# Patient Record
Sex: Female | Born: 1985 | Race: Black or African American | Hispanic: No | Marital: Single | Smoking: Never smoker
Health system: Southern US, Community
[De-identification: ages and names within clinical notes are randomized; demographics above are authoritative.]

## PROBLEM LIST (undated history)

## (undated) ENCOUNTER — Inpatient Hospital Stay (HOSPITAL_COMMUNITY): Payer: Self-pay

## (undated) ENCOUNTER — Inpatient Hospital Stay (HOSPITAL_COMMUNITY): Payer: Medicaid Other

## (undated) DIAGNOSIS — Z789 Other specified health status: Secondary | ICD-10-CM

## (undated) DIAGNOSIS — R12 Heartburn: Secondary | ICD-10-CM

## (undated) DIAGNOSIS — O26899 Other specified pregnancy related conditions, unspecified trimester: Secondary | ICD-10-CM

## (undated) HISTORY — PX: TUBAL LIGATION: SHX77

---
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
## 2003-09-14 ENCOUNTER — Emergency Department (HOSPITAL_COMMUNITY): Admission: EM | Admit: 2003-09-14 | Discharge: 2003-09-14 | Payer: Self-pay | Admitting: Emergency Medicine

## 2003-12-03 ENCOUNTER — Inpatient Hospital Stay (HOSPITAL_COMMUNITY): Admission: AD | Admit: 2003-12-03 | Discharge: 2003-12-04 | Payer: Self-pay | Admitting: *Deleted

## 2004-01-14 ENCOUNTER — Emergency Department (HOSPITAL_COMMUNITY): Admission: EM | Admit: 2004-01-14 | Discharge: 2004-01-15 | Payer: Self-pay | Admitting: Emergency Medicine

## 2004-08-09 ENCOUNTER — Inpatient Hospital Stay (HOSPITAL_COMMUNITY): Admission: AD | Admit: 2004-08-09 | Discharge: 2004-08-09 | Payer: Self-pay | Admitting: Obstetrics & Gynecology

## 2004-09-07 ENCOUNTER — Encounter (INDEPENDENT_AMBULATORY_CARE_PROVIDER_SITE_OTHER): Payer: Self-pay | Admitting: *Deleted

## 2004-09-07 ENCOUNTER — Inpatient Hospital Stay (HOSPITAL_COMMUNITY): Admission: AD | Admit: 2004-09-07 | Discharge: 2004-09-11 | Payer: Self-pay | Admitting: Obstetrics & Gynecology

## 2007-02-11 ENCOUNTER — Emergency Department (HOSPITAL_COMMUNITY): Admission: EM | Admit: 2007-02-11 | Discharge: 2007-02-11 | Payer: Self-pay | Admitting: Emergency Medicine

## 2010-08-02 ENCOUNTER — Encounter: Payer: Self-pay | Admitting: Obstetrics

## 2010-11-27 NOTE — Op Note (Signed)
Miranda Bowen, Miranda Bowen               ACCOUNT NO.:  [id number]   MEDICAL RECORD NO.:  [id number]          PATIENT TYPE:  INP   LOCATION:  9145                          FACILITY:  [facility]   PHYSICIAN:  Charles A. Clearance Coots, M.D.DATE OF BIRTH:  [birth year]   DATE OF PROCEDURE:  09/08/2004  DATE OF DISCHARGE:                                 OPERATIVE REPORT   PREOP DIAGNOSIS:  1.  [redacted] weeks gestation.  2.  Breech presentation.  3.  Unengaged.  4.  Active rupture of membranes.   DISCHARGE DIAGNOSIS:  1.  [redacted] weeks gestation.  2.  Breech presentation.  3.  Unengaged.  4.  Active rupture of membranes.  5.  Status post primary low transverse cesarean section on 09/08/2004 with      delivery of viable female infant at 12:34, Apgars of 8 at one minute and 9      at five minutes, weight of 7 pounds 13 ounces. Mother and infant      discharged home in good condition.   REASON FOR ADMISSION:  25 year old black female presented at [redacted] weeks  gestation with uterine contractions, a favorable cervix. Decision was made  to proceed with augmentation of labor. The patient was started on Pitocin  IV. Upon reaching 3 cm dilatation, amniotic membranes were artificially  ruptured and on further examination the presenting part was found to be  frank breech. Decision was made to proceed with cesarean section delivery  and the patient agreed.   PAST MEDICAL HISTORY:  Surgery; none. Illnesses; none.   MEDICATIONS:  Prenatal vitamins.   ALLERGIES:  NO KNOWN DRUG ALLERGIES.   SOCIAL HISTORY:  Single. Negative tobacco, alcohol, or recreational drug  use.   PHYSICAL EXAM:  GENERAL:  Well-nourished, well-developed black female in no  acute distress.  VITAL SIGNS:  Afebrile, vital signs stable.  HEENT exam was within normal limits.  LUNGS:  Clear to auscultation bilaterally.  HEART: regular rate and rhythm.  ABDOMEN: Gravid, soft, nontender. Cervix 3 cm dilated, 70% effaced and  breech, at -3 station. The breech  was -3 station and unengaged.   ADMITTING LABORATORY VALUES:  Please refer to a chart.   HOSPITAL COURSE:  The patient was taken to the operating room and primary  Pfannenstiel skin incision was made with scalpel down to the fascia. Fascia  was nicked in the midline and fascial incision was extended to the left and  to the right with curved Mayo scissors. Superior and inferior fascial edges  were taken off of the rectus muscle with blunt and sharp dissection. Rectus  rectus muscle was divided sharply in the midline being careful to avoid the  urinary bladder. Peritoneum was entered digitally and was digitally extended  to the left and to right. The bladder blade was positioned and the breech  was palpated in lower uterine segment as a frank breech. The primary low  transverse cesarean section was then performed in the lower uterine segment.  Membranes were ruptured and the breech was delivered as a frank breech in  routine fashion without complications and with the assistance of  Dr. Jean RosenthalChristell Constant. Infant's mouth and nose were suctioned with suction bulb and cord was  doubly clamped and the infant was handed off to nursery staff. Cord blood  was obtained and submitted to pathology for evaluation and the placenta was  also manually removed from the uterine cavity intact and submitted to  pathology for evaluation. The endometrial surface was thoroughly debrided  with a dry lap sponge and the edges of the uterine incision were grasped  with ring forceps. Uterus was closed with a double layer of first layer  being a continuous interlocking suture of 0 Monocryl. Second layer being a  continuous imbricating suture of 0 Monocryl. Hemostasis was excellent.  Pelvic cavity was thoroughly irrigated with warm saline solution and all  areas of clots and bleeding were coagulated with the Bovie until good  hemostasis was obtained. The pelvic cavity was then closed as follows.  Peritoneum was closed  with a continuous suture of 2-0 Monocryl. Fascia was  closed with a continuous suture of 0 PDS from each corner to the center.  Subcutaneous tissue was thoroughly irrigated with warm saline solution. All  areas of subcutaneous bleeding were coagulated with Bovie. Skin was  approximated with surgical stainless steel staples. Sterile bandage was  applied to the incision closure. Surgical technician indicated that all  needle, sponge and instrument counts were correct x2. The patient tolerated  the procedure well and transported to recovery room in satisfactory  condition.      CAH/MEDQ  D:  09/10/2004  T:  09/10/2004  Job:  [id number]

## 2010-11-27 NOTE — Discharge Summary (Signed)
NAMEKENSEY, LUEPKE               ACCOUNT NO.:  [id number]   MEDICAL RECORD NO.:  [id number]          PATIENT TYPE:  INP   LOCATION:  9145                          FACILITY:  [facility]   PHYSICIAN:  Charles A. Clearance Coots, M.D.DATE OF BIRTH:  1986-02-14   DATE OF ADMISSION:  09/07/2004  DATE OF DISCHARGE:  09/11/2004                                 DISCHARGE SUMMARY   ADMISSION DIAGNOSES:  1.  At 40+ weeks gestation.  2.  Uterine contractions.   DISCHARGE DIAGNOSES:  1.  At 40+ weeks gestation.  2.  Uterine contractions.  3.  Status post primary low transverse cesarean section for breech      presentation.  Delivered a viable female infant on September 08, 2004, at      12:34.  Apgars of 8 at one minute and 9 at five minutes.  Weight 3560 g.      Length 52 cm.  Mother and infant discharged home in good condition.   REASON FOR ADMISSION:  A 25 year old female G1 at 40-2/[redacted] weeks gestation,  presented to the office with uterine contractions.  Prenatal care was  uncomplicated starting at eight weeks gestation.   PAST MEDICAL HISTORY:  Surgery:  None.  Illnesses:  None.   MEDICATIONS:  Prenatal vitamins.   ALLERGIES:  No known drug allergies.   PRENATAL LABS:  Blood type A positive.  Antibody screen was positive for  anti-M.  Rubella immune.  Hepatitis B surface antigen negative.  Group B  Strep negative.  Syphilis nonreactive.  HIV nonreactive.   PHYSICAL EXAMINATION:  GENERAL APPEARANCE:  A well-nourished, well-developed  female in no acute distress.  VITAL SIGNS:  Afebrile.  Vital signs are stable.  ABDOMEN:  Gravid, nontender.  CERVIX:  1 cm dilated, 50% effaced and felt to be vertex at a -3 station.   Patient was admitted for cervical ripening with Cervidil.   ADMISSION LABORATORY DATA:  Hemoglobin 11.3, hematocrit 34.5, white blood  cell count 11,300, platelets 348,000.   HOSPITAL COURSE:  Patient was admitted and ripened overnight with Cervidil.  On reevaluation in the a.m.,  cervix was thought to be 2 cm dilated, 60%  effaced and was felt to be vertex at -4.  Pitocin was then started and on  reexamination, the position was felt not to be vertex and informal  ultrasound was done which confirmed the presentation to be breech.  A  decision was made to proceed with cesarean section delivery.  Primary  cesarean section was performed on September 08, 2004, without complications.  It was a primary low transverse cesarean section.  There were in no  intraoperative complications.   Postoperative course was uncomplicated.  Patient was discharged home on  postoperative day #3 in good condition.   DISCHARGE LABORATORY DATA:  Hemoglobin 9.7, hematocrit 29.1, white blood  cell count 11,500, platelets 310,000.   DISPOSITION:  1.  Medications:  Tylox and ibuprofen prescribed for pain.  2.  Routine written instructions per booklet were given for obstetrical      discharge after a cesarean section.  3.  The patient is to  call the office for a follow-up appointment in two      weeks.      CAH/MEDQ  D:  09/29/2004  T:  09/29/2004  Job:  [id number]

## 2011-06-21 ENCOUNTER — Inpatient Hospital Stay (HOSPITAL_COMMUNITY)
Admission: AD | Admit: 2011-06-21 | Discharge: 2011-06-21 | Disposition: A | Discharge status: Home / Self Care | Payer: Commercial Managed Care - PPO | Source: Ambulatory Visit | Attending: Obstetrics & Gynecology | Admitting: Obstetrics & Gynecology

## 2011-06-21 ENCOUNTER — Encounter (HOSPITAL_COMMUNITY): Payer: Self-pay | Admitting: *Deleted

## 2011-06-21 DIAGNOSIS — B373 Candidiasis of vulva and vagina: Secondary | ICD-10-CM

## 2011-06-21 DIAGNOSIS — B3731 Acute candidiasis of vulva and vagina: Secondary | ICD-10-CM | POA: Insufficient documentation

## 2011-06-21 DIAGNOSIS — N949 Unspecified condition associated with female genital organs and menstrual cycle: Secondary | ICD-10-CM | POA: Insufficient documentation

## 2011-06-21 HISTORY — DX: Other specified health status: Z78.9

## 2011-06-21 LAB — URINALYSIS, ROUTINE W REFLEX MICROSCOPIC
Glucose, UA: NEGATIVE mg/dL
Ketones, ur: NEGATIVE mg/dL
Nitrite: NEGATIVE
Urobilinogen, UA: 2 mg/dL — ABNORMAL HIGH (ref 0.0–1.0)

## 2011-06-21 LAB — WET PREP, GENITAL
Clue Cells Wet Prep HPF POC: NONE SEEN
Trich, Wet Prep: NONE SEEN
Yeast Wet Prep HPF POC: NONE SEEN

## 2011-06-21 LAB — POCT PREGNANCY, URINE: Preg Test, Ur: NEGATIVE

## 2011-06-21 MED ORDER — FLUCONAZOLE 150 MG PO TABS: 150.0000 mg | ORAL_TABLET | Freq: Once | ORAL | Status: AC

## 2011-06-21 NOTE — Progress Notes (Signed)
Patient states she has been having pelvic pain off and on. Has not had a period since 10-27 and has had a negative home pregnancy test. Has been having headaches.

## 2011-06-22 LAB — GC/CHLAMYDIA PROBE AMP, GENITAL: Chlamydia, DNA Probe: NEGATIVE

## 2011-07-07 ENCOUNTER — Encounter (HOSPITAL_COMMUNITY): Payer: Self-pay | Admitting: *Deleted

## 2011-07-07 ENCOUNTER — Emergency Department (HOSPITAL_COMMUNITY): Payer: No Typology Code available for payment source

## 2011-07-07 ENCOUNTER — Emergency Department (HOSPITAL_COMMUNITY)
Admission: EM | Admit: 2011-07-07 | Discharge: 2011-07-07 | Disposition: A | Discharge status: Home / Self Care | Payer: No Typology Code available for payment source | Attending: Emergency Medicine | Admitting: Emergency Medicine

## 2011-07-07 DIAGNOSIS — R109 Unspecified abdominal pain: Secondary | ICD-10-CM | POA: Insufficient documentation

## 2011-07-07 DIAGNOSIS — R110 Nausea: Secondary | ICD-10-CM | POA: Insufficient documentation

## 2011-07-07 LAB — URINALYSIS, ROUTINE W REFLEX MICROSCOPIC
Ketones, ur: NEGATIVE mg/dL
Protein, ur: NEGATIVE mg/dL
Urobilinogen, UA: 1 mg/dL (ref 0.0–1.0)

## 2011-07-07 LAB — URINE MICROSCOPIC-ADD ON

## 2011-07-07 MED ORDER — OXYCODONE-ACETAMINOPHEN 5-325 MG PO TABS
1.0000 | ORAL_TABLET | ORAL | Status: AC | PRN
Start: 2011-07-07 — End: 2011-07-17

## 2011-07-07 MED ORDER — IOHEXOL 300 MG/ML  SOLN
85.0000 mL | Freq: Once | INTRAMUSCULAR | Status: AC | PRN
  Administered 2011-07-07: 85 mL via INTRAVENOUS

## 2011-07-07 MED ORDER — IBUPROFEN 200 MG PO TABS
600.0000 mg | ORAL_TABLET | Freq: Once | ORAL | Status: AC
  Administered 2011-07-07: 600 mg via ORAL
  Filled 2011-07-07: qty 3

## 2011-07-07 MED ORDER — IBUPROFEN 800 MG PO TABS: 800.0000 mg | ORAL_TABLET | Freq: Three times a day (TID) | ORAL | Status: AC

## 2011-07-07 MED ORDER — ONDANSETRON 4 MG PO TBDP
8.0000 mg | ORAL_TABLET | Freq: Once | ORAL | Status: AC
  Administered 2011-07-07: 8 mg via ORAL
  Filled 2011-07-07: qty 2
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
## 2011-07-16 NOTE — ED Provider Notes (Signed)
Evaluation and management procedures were performed by the PA/NP/CNM under my supervision/collaboration.   Chrystine Oiler, MD 07/16/11 [phone]

## 2012-08-27 ENCOUNTER — Encounter (HOSPITAL_COMMUNITY): Payer: Self-pay | Admitting: Nurse Practitioner

## 2012-08-27 ENCOUNTER — Emergency Department (HOSPITAL_COMMUNITY)
Admission: EM | Admit: 2012-08-27 | Discharge: 2012-08-27 | Disposition: A | Discharge status: Home / Self Care | Payer: Self-pay | Attending: Emergency Medicine | Admitting: Emergency Medicine

## 2012-08-27 DIAGNOSIS — L0291 Cutaneous abscess, unspecified: Secondary | ICD-10-CM

## 2012-08-27 DIAGNOSIS — IMO0002 Reserved for concepts with insufficient information to code with codable children: Secondary | ICD-10-CM | POA: Insufficient documentation

## 2012-08-27 MED ORDER — HYDROCODONE-ACETAMINOPHEN 5-325 MG PO TABS: ORAL_TABLET | ORAL | Status: DC

## 2012-08-27 NOTE — ED Provider Notes (Signed)
History    This chart was scribed for non-physician practitioner working with Dione Booze, MD by Frederik Pear, ED Scribe. This patient was seen in room TR11C/TR11C and the patient's care was started at 1459.     CSN: [id number]  Arrival date & time 08/27/12  1344   First MD Initiated Contact with Patient 08/27/12 1459      Chief Complaint  Patient presents with  . Wound Infection    (Consider location/radiation/quality/duration/timing/severity/associated sxs/prior treatment) The history is provided by the patient. No language interpreter was used.    Miranda Bowen is a 27 y.o. female who presents to the Emergency Department complaining of a gradual onset, gradually worsening, constant, non-radiating boil at the right axilla that began 1 week ago. She has a h/o of similar boils, but states that all the previous instances have resolved on their own. She denies any fever, emesis, nausea, fatigue. She reports that she has been treating the area with an OTC boil cream with no relief. She denies any chronic medical conditions that require daily medication.   Past Medical History  Diagnosis Date  . No pertinent past medical history     Past Surgical History  Procedure Laterality Date  . Cesarean section      History reviewed. No pertinent family history.  History  Substance Use Topics  . Smoking status: Never Smoker   . Smokeless tobacco: Not on file  . Alcohol Use: No    OB History   Grav Para Term Preterm Abortions TAB SAB Ect Mult Living   1 1 1       1       Review of Systems  Constitutional: Negative for fever and fatigue.  Gastrointestinal: Negative for nausea and vomiting.  Skin: Positive for wound. Negative for color change.       Positive for abscess.  Hematological: Negative for adenopathy.  All other systems reviewed and are negative.    Allergies  Review of patient's allergies indicates no known allergies.  Home Medications   Current Outpatient Rx   Name  Route  Sig  Dispense  Refill  . OVER THE COUNTER MEDICATION   Topical   Apply 1 application topically 2 (two) times daily as needed (boils). Boil cream           BP 121/74  Pulse 88  Temp(Src) 98.4 F (36.9 C) (Oral)  Resp 16  SpO2 98%  Physical Exam  Nursing note and vitals reviewed. Constitutional: She appears well-developed and well-nourished. No distress.  HENT:  Head: Normocephalic and atraumatic.  Eyes: EOM are normal. Pupils are equal, round, and reactive to light.  Neck: Normal range of motion. Neck supple. No tracheal deviation present.  Cardiovascular: Normal rate.   Pulmonary/Chest: Effort normal. No respiratory distress.  Abdominal: Soft. She exhibits no distension.  Musculoskeletal: Normal range of motion. She exhibits no edema.  Neurological: She is alert.  Skin: Skin is warm and dry.  There is a 4x2 cm flucuant area at the right axilla that is palpable with surrounding erythema, but no evidence of surrounding cellulitis.  Psychiatric: She has a normal mood and affect. Her behavior is normal. Thought content normal.    ED Course  Procedures (including critical care time)  DIAGNOSTIC STUDIES: Oxygen Saturation is 98% on room air, normal by my interpretation.    COORDINATION OF CARE:  15:05- Discussed planned course of treatment with the patient, including draining the area and applying heat to the area over the next few  days at home, who is agreeable at this time.  15:35- INCISION AND DRAINAGE Performed by: Rhea Bleacher Consent: Verbal consent obtained. Risks and benefits: risks, benefits and alternatives were discussed Type: abscess  Body area: right axilla  Anesthesia: local infiltration  Incision was made with a scalpel.  Local anesthetic: lidocaine 2% with epinephrine  Anesthetic total: 2 ml  Complexity: complex Blunt dissection to break up loculations  Drainage: purulent  Drainage amount: moderate  Packing material:  N/A  Patient tolerance: Patient tolerated the procedure well with no immediate complications.   Labs Reviewed - No data to display No results found.   1. Abscess     Vital signs reviewed and are as follows: Filed Vitals:   08/27/12 1559  BP: 108/69  Pulse: 69  Temp:   Resp: 17   The patient was urged to return to the Emergency Department urgently with worsening pain, swelling, expanding erythema especially if it streaks away from the affected area, fever, or if they have any other concerns.   The patient was urged to return to the Emergency Department or go to their PCP in 48 hours for wound recheck if the area is not significantly improved.  The patient verbalized understanding and stated agreement with this plan.   Patient counseled on use of narcotic pain medications. Counseled not to combine these medications with others containing tylenol. Urged not to drink alcohol, drive, or perform any other activities that requires focus while taking these medications. The patient verbalizes understanding and agrees with the plan.   MDM  Patient with skin abscess amenable to incision and drainage.  No signs of cellulitis is surrounding skin.  Will d/c to home.  No antibiotic therapy is indicated. No systemic symptoms of illness.    I personally performed the services described in this documentation, which was scribed in my presence. The recorded information has been reviewed and is accurate.        Renne Crigler, [place] 08/27/12 [phone]

## 2012-08-27 NOTE — ED Notes (Signed)
Boil under R armpit last week, applyign OTC boil cream with no relief

## 2012-08-27 NOTE — ED Provider Notes (Signed)
Medical screening examination/treatment/procedure(s) were performed by non-physician practitioner and as supervising physician I was immediately available for consultation/collaboration.   Dione Booze, MD 08/27/12 [phone]
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
## 2012-10-18 ENCOUNTER — Inpatient Hospital Stay (HOSPITAL_COMMUNITY)
Admission: AD | Admit: 2012-10-18 | Discharge: 2012-10-19 | Disposition: A | Discharge status: Home / Self Care | Payer: Self-pay | Source: Ambulatory Visit | Attending: Obstetrics & Gynecology | Admitting: Obstetrics & Gynecology

## 2012-10-18 ENCOUNTER — Encounter (HOSPITAL_COMMUNITY): Payer: Self-pay

## 2012-10-18 DIAGNOSIS — A599 Trichomoniasis, unspecified: Secondary | ICD-10-CM

## 2012-10-18 DIAGNOSIS — R630 Anorexia: Secondary | ICD-10-CM | POA: Insufficient documentation

## 2012-10-18 DIAGNOSIS — R109 Unspecified abdominal pain: Secondary | ICD-10-CM | POA: Insufficient documentation

## 2012-10-18 DIAGNOSIS — A5901 Trichomonal vulvovaginitis: Secondary | ICD-10-CM | POA: Insufficient documentation

## 2012-10-18 DIAGNOSIS — R112 Nausea with vomiting, unspecified: Secondary | ICD-10-CM | POA: Insufficient documentation

## 2012-10-18 LAB — COMPREHENSIVE METABOLIC PANEL
ALT: 9 U/L (ref 0–35)
AST: 18 U/L (ref 0–37)
Albumin: 3.6 g/dL (ref 3.5–5.2)
CO2: 26 mEq/L (ref 19–32)
Chloride: 102 mEq/L (ref 96–112)
GFR calc non Af Amer: >90 mL/min (ref >90)
Total Bilirubin: 0.6 mg/dL (ref 0.3–1.2)

## 2012-10-18 LAB — CBC
HCT: 37.2 % (ref 36.0–46.0)
Hemoglobin: 12.4 g/dL (ref 12.0–15.0)
MCH: 29 pg (ref 26.0–34.0)
MCHC: 33.3 g/dL (ref 30.0–36.0)
MCV: 86.9 fL (ref 78.0–100.0)
RDW: 12.4 % (ref 11.5–15.5)

## 2012-10-18 LAB — URINALYSIS, ROUTINE W REFLEX MICROSCOPIC
Hgb urine dipstick: NEGATIVE
Nitrite: NEGATIVE
Protein, ur: NEGATIVE mg/dL
Urobilinogen, UA: 1 mg/dL (ref 0.0–1.0)

## 2012-10-18 LAB — WET PREP, GENITAL: Yeast Wet Prep HPF POC: NONE SEEN

## 2012-10-18 MED ORDER — ONDANSETRON 8 MG PO TBDP
8.0000 mg | ORAL_TABLET | Freq: Once | ORAL | Status: AC
  Administered 2012-10-18: 8 mg via ORAL
  Filled 2012-10-18: qty 1

## 2012-10-18 MED ORDER — METRONIDAZOLE 500 MG PO TABS
2000.0000 mg | ORAL_TABLET | Freq: Once | ORAL | Status: AC
  Administered 2012-10-19: 2000 mg via ORAL
  Filled 2012-10-18: qty 4

## 2012-10-18 NOTE — MAU Note (Signed)
Pt unsure if pregnant. Took home UPT, but control line didn't show so thought she took it wrong. LMP was 3/20, but was shorter & lighter than normal. Abdominal cramping x 3 weeks intermittently. Vomiting every time she eats. Denies vaginal bleeding since LMP. Denies vaginal discharge.

## 2012-10-18 NOTE — MAU Note (Addendum)
Pt reports cramping for 1 week off and on. Pt reports that she has been nauseated for a couple of weeks and having a hard time keeping food down. Pt throwing up off and on for almost a week. Pt thought that she may be pregnant. Pt states that she has not eaten today except for 3 fries, does not have an appetite

## 2012-10-18 NOTE — MAU Provider Note (Signed)
Chief Complaint: Possible Pregnancy, Abdominal Pain and Emesis  First Provider Initiated Contact with Patient 10/18/12 2257     SUBJECTIVE HPI: Miranda Bowen is a 27 y.o. G2P1011 who presents with cramping, N/V x 1 week, loss of appetite. Thinks she may be pregnant.  Vomiting a few x per day. No Hx of same. LMP 09/28/12 was shorter than usual.    Past Medical History  Diagnosis Date  . No pertinent past medical history    OB History   Grav Para Term Preterm Abortions TAB SAB Ect Mult Living   2 1 1  1  1   1      # Outc Date GA Lbr Len/2nd Wgt Sex Del Anes PTL Lv   1 TRM 2006    M CS Spinal     2 SAB              Past Surgical History  Procedure Laterality Date  . Cesarean section     History   Social History  . Marital Status: Single    Spouse Name: N/A    Number of Children: N/A  . Years of Education: N/A   Occupational History  . Not on file.   Social History Main Topics  . Smoking status: Never Smoker   . Smokeless tobacco: Not on file  . Alcohol Use: No  . Drug Use: No  . Sexually Active: Yes   Other Topics Concern  . Not on file   Social History Narrative  . No narrative on file   No current facility-administered medications on file prior to encounter.   Current Outpatient Prescriptions on File Prior to Encounter  Medication Sig Dispense Refill  . HYDROcodone-acetaminophen (NORCO/VICODIN) 5-325 MG per tablet Take 1-2 tablets every 6 hours as needed for severe pain  8 tablet  0  . OVER THE COUNTER MEDICATION Apply 1 application topically 2 (two) times daily as needed (boils). Boil cream       No Known Allergies  ROS: Neg for fever, chills, diarrhea, constipation, sick contacts, vaginal discharge, vaginal bleeding.   OBJECTIVE Blood pressure 112/76, pulse 74, temperature 98.5 F (36.9 C), temperature source Oral, resp. rate 18, height 5\' 7"  (1.702 m), weight 62.687 kg (138 lb 3.2 oz), last menstrual period 09/28/2012, SpO2 100.00%. GENERAL:  Well-developed, well-nourished female in no acute distress.  HEENT: Normocephalic HEART: normal rate RESP: normal effort ABDOMEN: Soft, non-tender EXTREMITIES: Nontender, no edema NEURO: Alert and oriented SPECULUM EXAM: NEFG except for 1cm healing lesion on right side of mons pubis. Moderate amount of malodorous discharge, no blood noted, cervix clean, but visualization limited by severe intolerance of Pederson speculum. Pediatric speculum used. BIMANUAL: cervix closed; uterus normal size, no adnexal tenderness or masses  LAB RESULTS Results for orders placed during the hospital encounter of 10/18/12 (from the past 24 hour(s))  URINALYSIS, ROUTINE W REFLEX MICROSCOPIC     Status: None   Collection Time    10/18/12  8:57 PM      Result Value Range   Color, Urine YELLOW  YELLOW   APPearance CLEAR  CLEAR   Specific Gravity, Urine 1.020  1.005 - 1.030   pH 6.5  5.0 - 8.0   Glucose, UA NEGATIVE  NEGATIVE mg/dL   Hgb urine dipstick NEGATIVE  NEGATIVE   Bilirubin Urine NEGATIVE  NEGATIVE   Ketones, ur NEGATIVE  NEGATIVE mg/dL   Protein, ur NEGATIVE  NEGATIVE mg/dL   Urobilinogen, UA 1.0  0.0 - 1.0 mg/dL  Nitrite NEGATIVE  NEGATIVE   Leukocytes, UA NEGATIVE  NEGATIVE  POCT PREGNANCY, URINE     Status: None   Collection Time    10/18/12  9:13 PM      Result Value Range   Preg Test, Ur NEGATIVE  NEGATIVE  CBC     Status: None   Collection Time    10/18/12 10:27 PM      Result Value Range   WBC 8.6  4.0 - 10.5 K/uL   RBC 4.28  3.87 - 5.11 MIL/uL   Hemoglobin 12.4  12.0 - 15.0 g/dL   HCT 18.8  41.6 - 60.6 %   MCV 86.9  78.0 - 100.0 fL   MCH 29.0  26.0 - 34.0 pg   MCHC 33.3  30.0 - 36.0 g/dL   RDW 30.1  60.1 - 09.3 %   Platelets 293  150 - 400 K/uL  COMPREHENSIVE METABOLIC PANEL     Status: Abnormal   Collection Time    10/18/12 10:27 PM      Result Value Range   Sodium 137  135 - 145 mEq/L   Potassium 3.5  3.5 - 5.1 mEq/L   Chloride 102  96 - 112 mEq/L   CO2 26  19 - 32  mEq/L   Glucose, Bld 105 (*) 70 - 99 mg/dL   BUN 19  6 - 23 mg/dL   Creatinine, Ser 2.35  0.50 - 1.10 mg/dL   Calcium 9.1  8.4 - 57.3 mg/dL   Total Protein 7.4  6.0 - 8.3 g/dL   Albumin 3.6  3.5 - 5.2 g/dL   AST 18  0 - 37 U/L   ALT 9  0 - 35 U/L   Alkaline Phosphatase 67  39 - 117 U/L   Total Bilirubin 0.6  0.3 - 1.2 mg/dL   GFR calc non Af Amer >90  >90 mL/min   GFR calc Af Amer >90  >90 mL/min  HCG, SERUM, QUALITATIVE     Status: None   Collection Time    10/18/12 10:30 PM      Result Value Range   Preg, Serum NEGATIVE  NEGATIVE  WET PREP, GENITAL     Status: Abnormal   Collection Time    10/18/12 10:40 PM      Result Value Range   Yeast Wet Prep HPF POC NONE SEEN  NONE SEEN   Trich, Wet Prep FEW (*) NONE SEEN   Clue Cells Wet Prep HPF POC FEW (*) NONE SEEN   WBC, Wet Prep HPF POC FEW (*) NONE SEEN    IMAGING No results found.  MAU COURSE Flagyl and Zofran given  ASSESSMENT 1. Trichomonas infection   2. Nausea & vomiting     PLAN Discharge home. Partner needs Tx. Abstain until 1 week after both have been Tx. Always use condoms. GC/CT pending     Follow-up Information   Follow up with [facility] [place]. (for further STD testing)    Contact information:   [address] [place] [address] [phone]       Medication List    TAKE these medications       HYDROcodone-acetaminophen 5-325 MG per tablet  Commonly known as:  NORCO/VICODIN  Take 1-2 tablets every 6 hours as needed for severe pain     ondansetron 4 MG tablet  Commonly known as:  ZOFRAN  Take 1 tablet (4 mg total) by mouth every 8 (eight) hours as needed for nausea.  OVER THE COUNTER MEDICATION  Apply 1 application topically 2 (two) times daily as needed (boils). Boil cream       [place], [place] 10/19/2012  12:18 AM

## 2012-10-19 DIAGNOSIS — A599 Trichomoniasis, unspecified: Secondary | ICD-10-CM

## 2012-10-19 MED ORDER — ONDANSETRON HCL 4 MG PO TABS: 4.0000 mg | ORAL_TABLET | Freq: Three times a day (TID) | ORAL | Status: DC | PRN

## 2012-10-19 NOTE — MAU Note (Signed)
Attempted E-signature x3. Pt signed out in triage, but did not show up on computer

## 2013-06-26 ENCOUNTER — Encounter (HOSPITAL_COMMUNITY): Payer: Self-pay | Admitting: Emergency Medicine

## 2013-06-26 ENCOUNTER — Emergency Department (HOSPITAL_COMMUNITY)
Admission: EM | Admit: 2013-06-26 | Discharge: 2013-06-26 | Disposition: A | Discharge status: Home / Self Care | Payer: Commercial Managed Care - PPO | Attending: Emergency Medicine | Admitting: Emergency Medicine

## 2013-06-26 DIAGNOSIS — J029 Acute pharyngitis, unspecified: Secondary | ICD-10-CM | POA: Insufficient documentation

## 2013-06-26 DIAGNOSIS — J3489 Other specified disorders of nose and nasal sinuses: Secondary | ICD-10-CM | POA: Insufficient documentation

## 2013-06-26 DIAGNOSIS — R059 Cough, unspecified: Secondary | ICD-10-CM | POA: Insufficient documentation

## 2013-06-26 DIAGNOSIS — R110 Nausea: Secondary | ICD-10-CM | POA: Insufficient documentation

## 2013-06-26 DIAGNOSIS — R05 Cough: Secondary | ICD-10-CM | POA: Insufficient documentation

## 2013-06-26 DIAGNOSIS — R42 Dizziness and giddiness: Secondary | ICD-10-CM | POA: Insufficient documentation

## 2013-06-26 DIAGNOSIS — R51 Headache: Secondary | ICD-10-CM | POA: Insufficient documentation

## 2013-06-26 MED ORDER — IBUPROFEN 800 MG PO TABS: 800.0000 mg | ORAL_TABLET | Freq: Three times a day (TID) | ORAL | Status: DC

## 2013-06-26 MED ORDER — DIPHENHYDRAMINE HCL 25 MG PO CAPS
25.0000 mg | ORAL_CAPSULE | Freq: Once | ORAL | Status: AC
  Administered 2013-06-26: 25 mg via ORAL
  Filled 2013-06-26: qty 1

## 2013-06-26 MED ORDER — PROMETHAZINE HCL 25 MG PO TABS
25.0000 mg | ORAL_TABLET | Freq: Once | ORAL | Status: AC
  Administered 2013-06-26: 25 mg via ORAL
  Filled 2013-06-26: qty 1

## 2013-06-26 MED ORDER — PROMETHAZINE HCL 25 MG PO TABS: 25.0000 mg | ORAL_TABLET | Freq: Four times a day (QID) | ORAL | Status: DC | PRN

## 2013-06-26 MED ORDER — IBUPROFEN 800 MG PO TABS
800.0000 mg | ORAL_TABLET | Freq: Once | ORAL | Status: AC
  Administered 2013-06-26: 800 mg via ORAL
  Filled 2013-06-26: qty 1

## 2013-06-26 NOTE — ED Provider Notes (Signed)
Medical screening examination/treatment/procedure(s) were performed by non-physician practitioner and as supervising physician I was immediately available for consultation/collaboration.  EKG Interpretation   None        Shon Baton, MD 06/26/13 2329

## 2013-06-26 NOTE — ED Provider Notes (Signed)
CSN: [id number]     Arrival date & time 06/26/13  2117 History  This chart was scribed for non-physician practitioner Ruby Cola, PA-C, working with Shon Baton, MD by Dorothey Baseman, ED Scribe. This patient was seen in room WTR8/WTR8 and the patient's care was started at 10:10 PM.    Chief Complaint  Patient presents with  . Headache  . Cough   The history is provided by the patient. No language interpreter was used.   HPI Comments: Miranda Bowen is a 27 y.o. female who presents to the Emergency Department complaining of an intermittent, pressure-like headache to the bilateral temple/forehead region onset yesterday. She reports some associated nausea, lightheadedness, nasal congestion, sore throat, and cough. She denies any potential injury or trauma to the area. She reports taking Advil at home without relief. She denies fever, visual changes, dizziness, emesis. 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 Patient denies any recent stress exacerbation. She reports that her menstrual period is usually irregular, but denies the possibility of being pregnant. Patient has no other pertinent medical history.   Past Medical History  Diagnosis Date  . No pertinent past medical history    Past Surgical History  Procedure Laterality Date  . Cesarean section     Family History  Problem Relation Age of Onset  . Hypertension Mother    History  Substance Use Topics  . Smoking status: Never Smoker   . Smokeless tobacco: Not on file  . Alcohol Use: No   OB History   Grav Para Term Preterm Abortions TAB SAB Ect Mult Living   2 1 1  1  1   1      Review of Systems  A complete 10 system review of systems was obtained and all systems are negative except as noted in the HPI and PMH.   Allergies  Review of patient's allergies indicates no known allergies.  Home Medications   Current Outpatient Rx  Name  Route  Sig  Dispense  Refill   . ibuprofen (ADVIL,MOTRIN) 200 MG tablet   Oral   Take 600 mg by mouth every 6 (six) hours as needed for headache or moderate pain.          Triage Vitals: BP 125/73  Pulse 84  Temp(Src) 98.2 F (36.8 C) (Oral)  Resp 18  Ht 5\' 8"  (1.727 m)  Wt 135 lb (61.236 kg)  BMI 20.53 kg/m2  SpO2 100%  LMP 06/06/2013  Physical Exam  Nursing note and vitals reviewed. Constitutional: She is oriented to person, place, and time. She appears well-developed and well-nourished. No distress.  HENT:  Head: Normocephalic and atraumatic.  Mouth/Throat: Oropharynx is clear and moist. No oropharyngeal exudate.  Mild frontal/maxillarysinus as well as bilateral temple tenderness to palpation.   Eyes: Conjunctivae and EOM are normal. Pupils are equal, round, and reactive to light.  Normal appearance  Neck: Normal range of motion.  No meningismus   Cardiovascular: Normal rate, regular rhythm, normal heart sounds and intact distal pulses.   Pulmonary/Chest: Effort normal and breath sounds normal. No respiratory distress.  Musculoskeletal: Normal range of motion.  Neurological: She is alert and oriented to person, place, and time. No cranial nerve deficit or sensory deficit. Coordination normal.  CN 3-12 intact.  No nystagmus. 5/5 and equal upper and lower extremity strength.  No past pointing.     Skin: Skin is warm and dry. No rash noted.  Psychiatric: She  has a normal mood and affect. Her behavior is normal.    ED Course  Procedures (including critical care time)  DIAGNOSTIC STUDIES: Oxygen Saturation is 100% on room air, normal by my interpretation.    COORDINATION OF CARE: 10:15 PM- Discussed that symptoms may be due to a sinus infection, which is viral and will resolve on its own. Offered patient a pregnancy test and an injection of Toradol, but she refused. Will order oral Phenergan, Benadryl, and 800 mg ibuprofen to manage symptoms. Advised patient to take Sudafed at home. Return precautions  given. Discussed treatment plan with patient at bedside and patient verbalized agreement.     Labs Review Labs Reviewed - No data to display Imaging Review No results found.  EKG Interpretation   None       MDM   1. Headache    Healthy 27yo F presents w/ non-traumatic, bilateral temporal headache since yesterday.  Associated w/ nausea and lightheadedness.  On exam, afebrile, non-toxic appearing, no focal neuro deficits or meningismus.  Recommended pregnancy test but pt declined.  Suspect tension headache or viral respiratory illness/sinusitis.  No relief w/ advil yesterday and she is requesting pain medication now, but refuses an injection.  Ordered 800mg  ibuprofen + 25mg  po phenergan and benadryl.  Prescribed same to trial at home and recommended trying sudafed as well.  Return precautions discussed.   I personally performed the services described in this documentation, which was scribed in my presence. The recorded information has been reviewed and is accurate.     Otilio Miu, PA-C 06/26/13 2238

## 2013-06-26 NOTE — ED Notes (Signed)
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

## 2013-06-26 NOTE — Progress Notes (Signed)
   CARE MANAGEMENT ED NOTE 06/26/2013  Patient:  BLANCHIE, ZELEZNIK   Account Number:  [id number]  Date Initiated:  06/26/2013  Documentation initiated by:  Radford Pax  Subjective/Objective Assessment:   Patient presents to ED with multiple complaints.     Subjective/Objective Assessment Detail:     Action/Plan:   Action/Plan Detail:   Anticipated DC Date:       Status Recommendation to Physician:   Result of Recommendation:    Other ED Services  Consult Working Plan    DC Planning Services  Other  PCP issues    Choice offered to / List presented to:            Status of service:  Completed, signed off  ED Comments:   ED Comments Detail:  EDCM spoke to patient at bedside.  Patient confirms she does not have a pcp or [facility] insurance any more.  EDCM providedp patient with a list of pcps who accept self pay patients, list of discounted pharmacies and website needymeds.org for medication assistance, information regarding Medicaid and Affordable Care Act for insurance, list of financial resources in the community such as [employer] and local churches, urban ministries, dental assistance for uninsured patients.  Provided patient with phone number to inquire about the orange card.  Provided patient with discount RX card.  No further CM needs at this time.

## 2013-07-19 ENCOUNTER — Inpatient Hospital Stay (HOSPITAL_COMMUNITY)
Admission: AD | Admit: 2013-07-19 | Discharge: 2013-07-20 | Disposition: A | Discharge status: Home / Self Care | Payer: Medicaid Other | Source: Ambulatory Visit | Attending: Obstetrics & Gynecology | Admitting: Obstetrics & Gynecology

## 2013-07-19 ENCOUNTER — Inpatient Hospital Stay (HOSPITAL_COMMUNITY): Payer: Medicaid Other

## 2013-07-19 ENCOUNTER — Encounter (HOSPITAL_COMMUNITY): Payer: Self-pay | Admitting: *Deleted

## 2013-07-19 DIAGNOSIS — A499 Bacterial infection, unspecified: Secondary | ICD-10-CM | POA: Insufficient documentation

## 2013-07-19 DIAGNOSIS — B9689 Other specified bacterial agents as the cause of diseases classified elsewhere: Secondary | ICD-10-CM | POA: Insufficient documentation

## 2013-07-19 DIAGNOSIS — M545 Low back pain, unspecified: Secondary | ICD-10-CM | POA: Insufficient documentation

## 2013-07-19 DIAGNOSIS — O2390 Unspecified genitourinary tract infection in pregnancy, unspecified trimester: Secondary | ICD-10-CM | POA: Insufficient documentation

## 2013-07-19 DIAGNOSIS — O3680X0 Pregnancy with inconclusive fetal viability, not applicable or unspecified: Secondary | ICD-10-CM

## 2013-07-19 DIAGNOSIS — R109 Unspecified abdominal pain: Secondary | ICD-10-CM | POA: Insufficient documentation

## 2013-07-19 DIAGNOSIS — R51 Headache: Secondary | ICD-10-CM | POA: Insufficient documentation

## 2013-07-19 DIAGNOSIS — N390 Urinary tract infection, site not specified: Secondary | ICD-10-CM | POA: Insufficient documentation

## 2013-07-19 DIAGNOSIS — O26891 Other specified pregnancy related conditions, first trimester: Secondary | ICD-10-CM

## 2013-07-19 DIAGNOSIS — N760 Acute vaginitis: Secondary | ICD-10-CM | POA: Insufficient documentation

## 2013-07-19 DIAGNOSIS — O2341 Unspecified infection of urinary tract in pregnancy, first trimester: Secondary | ICD-10-CM

## 2013-07-19 LAB — CBC
HEMATOCRIT: 37.4 % (ref 36.0–46.0)
Hemoglobin: 12.9 g/dL (ref 12.0–15.0)
MCH: 29 pg (ref 26.0–34.0)
MCHC: 34.5 g/dL (ref 30.0–36.0)
MCV: 84 fL (ref 78.0–100.0)
PLATELETS: 345 10*3/uL (ref 150–400)
RBC: 4.45 MIL/uL (ref 3.87–5.11)
RDW: 12.2 % (ref 11.5–15.5)
WBC: 9.3 10*3/uL (ref 4.0–10.5)

## 2013-07-19 LAB — URINE MICROSCOPIC-ADD ON

## 2013-07-19 LAB — POCT PREGNANCY, URINE: Preg Test, Ur: POSITIVE — AB

## 2013-07-19 LAB — URINALYSIS, ROUTINE W REFLEX MICROSCOPIC
BILIRUBIN URINE: NEGATIVE
Glucose, UA: NEGATIVE mg/dL
Hgb urine dipstick: NEGATIVE
Ketones, ur: NEGATIVE mg/dL
LEUKOCYTES UA: NEGATIVE
NITRITE: POSITIVE — AB
Protein, ur: NEGATIVE mg/dL
SPECIFIC GRAVITY, URINE: 1.02 (ref 1.005–1.030)
UROBILINOGEN UA: 0.2 mg/dL (ref 0.0–1.0)
pH: 7.5 (ref 5.0–8.0)

## 2013-07-19 LAB — HCG, QUANTITATIVE, PREGNANCY: hCG, Beta Chain, Quant, S: 593 m[IU]/mL — ABNORMAL HIGH (ref <5)

## 2013-07-19 LAB — OB RESULTS CONSOLE GC/CHLAMYDIA
CHLAMYDIA, DNA PROBE: NEGATIVE
Gonorrhea: NEGATIVE

## 2013-07-19 MED ORDER — ACETAMINOPHEN 500 MG PO TABS
1000.0000 mg | ORAL_TABLET | Freq: Once | ORAL | Status: DC
  Filled 2013-07-19: qty 2

## 2013-07-19 NOTE — MAU Provider Note (Signed)
Chief Complaint: Headache and Back Pain  First Provider Initiated Contact with Patient 07/19/13 2322      SUBJECTIVE HPI: Miranda Bowen is a 28 y.o. G3P1011 at 6.2 by LMP who presents with: 1. positive home UPT 2. low back pain and mild low abdominal cramping since yesterday 3. intermittent temporal headaches x1 week. (History of same) 4. Nausea without vomiting x1 week.  Has not tried anything for her symptoms. Rates all of her discomforts 5/10 on pain scale. Declines pain meds. Admits to drinking a lot of sodas, no water.  Past Medical History  Diagnosis Date  . No pertinent past medical history    OB History  Gravida Para Term Preterm AB SAB TAB Ectopic Multiple Living  3 1 1  1 1    1     # Outcome Date GA Lbr Len/2nd Weight Sex Delivery Anes PTL Lv  3 CUR           2 TRM 2006    M CS Spinal    1 SAB              Past Surgical History  Procedure Laterality Date  . Cesarean section     History   Social History  . Marital Status: Single    Spouse Name: N/A    Number of Children: N/A  . Years of Education: N/A   Occupational History  . Not on file.   Social History Main Topics  . Smoking status: Never Smoker   . Smokeless tobacco: Not on file  . Alcohol Use: No  . Drug Use: No  . Sexual Activity: Yes    Birth Control/ Protection: None   Other Topics Concern  . Not on file   Social History Narrative  . No narrative on file   No current facility-administered medications on file prior to encounter.   Current Outpatient Prescriptions on File Prior to Encounter  Medication Sig Dispense Refill  . ibuprofen (ADVIL,MOTRIN) 200 MG tablet Take 400 mg by mouth every 6 (six) hours as needed for headache or moderate pain.       . promethazine (PHENERGAN) 25 MG tablet Take 1 tablet (25 mg total) by mouth every 6 (six) hours as needed for nausea or vomiting.  20 tablet  0   No Known Allergies  ROS: Pertinent items in HPI. Negative for fever, chills, urinary  complaints, vomiting, diarrhea, constipation, vaginal discharge, vaginal bleeding, dyspareunia, weakness, difficulties with speech or gait, vision changes, neck stiffness or sinus congestion.  OBJECTIVE Blood pressure 121/76, pulse 89, temperature 98 F (36.7 C), temperature source Oral, resp. rate 16, height 5\' 7"  (1.702 m), weight 61.746 kg (136 lb 2 oz), last menstrual period 06/06/2013. GENERAL: Well-developed, well-nourished female in no acute distress.  HEENT: Normocephalic. Sinuses nontender. Normal range of motion of neck. HEART: normal rate RESP: normal effort ABDOMEN: Soft, slight suprapubic tenderness.  BACK: Low back nontender. No CVA tenderness. EXTREMITIES: Nontender, no edema NEURO: Alert and oriented SPECULUM EXAM: NEFG, moderate amount of thin, white, malodorous discharge, scant, pink blood noted, cervix clean BIMANUAL: cervix closed; uterus normal size, no adnexal tenderness or masses. No cervical motion tenderness.  LAB RESULTS Results for orders placed during the hospital encounter of 07/19/13 (from the past 24 hour(s))  URINALYSIS, ROUTINE W REFLEX MICROSCOPIC     Status: Abnormal   Collection Time    07/19/13  9:10 PM      Result Value Range   Color, Urine YELLOW  YELLOW  APPearance CLEAR  CLEAR   Specific Gravity, Urine 1.020  1.005 - 1.030   pH 7.5  5.0 - 8.0   Glucose, UA NEGATIVE  NEGATIVE mg/dL   Hgb urine dipstick NEGATIVE  NEGATIVE   Bilirubin Urine NEGATIVE  NEGATIVE   Ketones, ur NEGATIVE  NEGATIVE mg/dL   Protein, ur NEGATIVE  NEGATIVE mg/dL   Urobilinogen, UA 0.2  0.0 - 1.0 mg/dL   Nitrite POSITIVE (*) NEGATIVE   Leukocytes, UA NEGATIVE  NEGATIVE  URINE MICROSCOPIC-ADD ON     Status: Abnormal   Collection Time    07/19/13  9:10 PM      Result Value Range   Squamous Epithelial / LPF MANY (*) RARE   WBC, UA 3-6  <3 WBC/hpf   Bacteria, UA MANY (*) RARE  POCT PREGNANCY, URINE     Status: Abnormal   Collection Time    07/19/13  9:25 PM       Result Value Range   Preg Test, Ur POSITIVE (*) NEGATIVE  ABO/RH     Status: None   Collection Time    07/19/13 10:13 PM      Result Value Range   ABO/RH(D) A POS    HCG, QUANTITATIVE, PREGNANCY     Status: Abnormal   Collection Time    07/19/13 10:13 PM      Result Value Range   hCG, Beta Chain, Quant, S 593 (*) <5 mIU/mL  CBC     Status: None   Collection Time    07/19/13 10:13 PM      Result Value Range   WBC 9.3  4.0 - 10.5 K/uL   RBC 4.45  3.87 - 5.11 MIL/uL   Hemoglobin 12.9  12.0 - 15.0 g/dL   HCT 40.9  81.1 - 91.4 %   MCV 84.0  78.0 - 100.0 fL   MCH 29.0  26.0 - 34.0 pg   MCHC 34.5  30.0 - 36.0 g/dL   RDW 78.2  95.6 - 21.3 %   Platelets 345  150 - 400 K/uL  WET PREP, GENITAL     Status: Abnormal   Collection Time    07/19/13 11:50 PM      Result Value Range   Yeast Wet Prep HPF POC NONE SEEN  NONE SEEN   Trich, Wet Prep NONE SEEN  NONE SEEN   Clue Cells Wet Prep HPF POC FEW (*) NONE SEEN   WBC, Wet Prep HPF POC FEW (*) NONE SEEN    IMAGING US Ob Transvaginal  07/20/2013   CLINICAL DATA:  Pelvic pain with positive urine pregnancy test  EXAM: TRANSABDOMINAL AND TRANSVAGINAL PELVIC/OB ULTRASOUND  TECHNIQUE: Study was performed transabdominally to optimize pelvic field of view evaluation and transvaginally to optimize internal visceral architecture evaluation.  COMPARISON:  None.  FINDINGS: The uterus is anteverted. The uterus measures 5.8 x 5.7 by 4.7 cm in size. There is no demonstrable intrauterine mass or gestation. There is no evidence of gestational sac by either transabdominal or transvaginal study. The endometrium measures 6 mm in thickness with a smooth contour.  Right ovary measures 4.3 x 2.5 by 1.9 cm in size. Left ovary measures 2.6 x 1.7 x 1.7 cm in size. There is a cystic mass which contains debris in the right ovary measuring 2.0 x 1.5 x 1.4 cm which may represent an early corpus luteum or hemorrhagic follicle. Color flow in this area suggests that this area  represents a corpus luteum. There is no other evidence  of pelvic mass. No extrauterine gestation seen. There is trace fluid in the cul-de-sac, likely physiologic.  IMPRESSION: No intrauterine gestation is seen by either transabdominal or transvaginal technique. There is a probable corpus luteum in the right ovary which contains mild debris. Differential diagnostic considerations in this overall circumstance include intrauterine gestation too early to be seen by either transabdominal or transvaginal technique; recent spontaneous abortion, or possible ectopic gestation. Close clinical and laboratory surveillance advised. Repeat imaging timing will in part depend on beta HCG evaluation results.   Electronically Signed   By: Bretta BangWilliam  Woodruff M.D.   On: 07/20/2013 00:10   MAU COURSE  ASSESSMENT 1. UTI in pregnancy, antepartum, first trimester   2. Pregnancy, location unknown   3. BV (bacterial vaginosis)   4. Headache in pregnancy, antepartum, first trimester    PLAN Discharge home in stable condition. Ectopic and SAB precautions reviewed. Comfort measures. GC/Chlamydia and urine cultures pending.     Follow-up Information   [facility] On 07/21/2013. (in the evening for blood work or sooner  as needed if symptoms worsen)    Contact information:   [id number] [address] [place] [id number] [phone]       Medication List    STOP taking these medications       ibuprofen 200 MG tablet  Commonly known as:  ADVIL,MOTRIN      TAKE these medications       cephALEXin 500 MG capsule  Commonly known as:  KEFLEX  Take 1 capsule (500 mg total) by mouth 4 (four) times daily.     metroNIDAZOLE 500 MG tablet  Commonly known as:  FLAGYL  Take 1 tablet (500 mg total) by mouth 2 (two) times daily.     promethazine 25 MG tablet  Commonly known as:  PHENERGAN  Take 1 tablet (25 mg total) by mouth every 6 (six) hours as  needed for nausea or vomiting.       [place] Jeyda Siebel, CNM 07/20/2013  12:29 AM

## 2013-07-19 NOTE — MAU Note (Signed)
Pt. States that she has been having headaches on and off for the last week.  Pt. Has also been nauseated for the past week also. Pt. Denies any bleeding and she is having lower back pain that started yesterday.

## 2013-07-20 ENCOUNTER — Encounter (HOSPITAL_COMMUNITY): Payer: Self-pay | Admitting: *Deleted

## 2013-07-20 DIAGNOSIS — O2390 Unspecified genitourinary tract infection in pregnancy, unspecified trimester: Secondary | ICD-10-CM

## 2013-07-20 LAB — WET PREP, GENITAL
TRICH WET PREP: NONE SEEN
YEAST WET PREP: NONE SEEN

## 2013-07-20 LAB — GC/CHLAMYDIA PROBE AMP
CT Probe RNA: NEGATIVE
GC Probe RNA: NEGATIVE

## 2013-07-20 LAB — ABO/RH: ABO/RH(D): A POS

## 2013-07-20 MED ORDER — CEPHALEXIN 500 MG PO CAPS: 500.0000 mg | ORAL_CAPSULE | Freq: Four times a day (QID) | ORAL | Status: AC

## 2013-07-20 MED ORDER — METRONIDAZOLE 500 MG PO TABS: 500.0000 mg | ORAL_TABLET | Freq: Two times a day (BID) | ORAL | Status: DC

## 2013-07-20 NOTE — Discharge Instructions (Signed)
Abdominal Pain During Pregnancy Abdominal discomfort is common in pregnancy. Most of the time, it does not cause harm. There are many causes of abdominal pain. Some causes are more serious than others. Some of the causes of abdominal pain in pregnancy are easily diagnosed. Occasionally, the diagnosis takes time to understand. Other times, the cause is not determined. Abdominal pain can be a sign that something is very wrong with the pregnancy, or the pain may have nothing to do with the pregnancy at all. For this reason, always tell your caregiver if you have any abdominal discomfort. CAUSES Common and harmless causes of abdominal pain include:  Constipation.  Excess gas and bloating.  Round ligament pain. This is pain that is felt in the folds of the groin.  The position the baby or placenta is in.  Baby kicks.  Braxton-Hicks contractions. These are mild contractions that do not cause cervical dilation. Serious causes of abdominal pain include:  Ectopic pregnancy. This happens when a fertilized egg implants outside of the uterus.  Miscarriage.  Preterm labor. This is when labor starts at less than 37 weeks of pregnancy.  Placental abruption. This is when the placenta partially or completely separates from the uterus.  Preeclampsia. This is often associated with high blood pressure and has been referred to as "toxemia in pregnancy."  Uterine or amniotic fluid infections. Causes unrelated to pregnancy include:  Urinary tract infection.  Gallbladder stones or inflammation.  Hepatitis or other liver illness.  Intestinal problems, stomach flu, food poisoning, or ulcer.  Appendicitis.  Kidney (renal) stones.  Kidney infection (pylonephritis). HOME CARE INSTRUCTIONS  For mild pain:  Do not have sexual intercourse or put anything in your vagina until your symptoms go away completely.  Get plenty of rest until your pain improves. If your pain does not improve in 1 hour, call  your caregiver.  Drink clear fluids if you feel nauseous. Avoid solid food as long as you are uncomfortable or nauseous.  Only take medicine as directed by your caregiver.  Keep all follow-up appointments with your caregiver. SEEK IMMEDIATE MEDICAL CARE IF:  You are bleeding, leaking fluid, or passing tissue from the vagina.  You have increasing pain or cramping.  You have persistent vomiting.  You have painful or bloody urination.  You have a fever.  You notice a decrease in your baby's movements.  You have extreme weakness or feel faint.  You have shortness of breath, with or without abdominal pain.  You develop a severe headache with abdominal pain.  You have abnormal vaginal discharge with abdominal pain.  You have persistent diarrhea.  You have abdominal pain that continues even after rest, or gets worse. MAKE SURE YOU:   Understand these instructions.  Will watch your condition.  Will get help right away if you are not doing well or get worse. Document Released: 06/28/2005 Document Revised: 09/20/2011 Document Reviewed: 01/25/2013 [facility] Patient Information 2014 [place], [place].  Ectopic Pregnancy An ectopic pregnancy is when the fertilized egg attaches (implants) outside the uterus. Most ectopic pregnancies occur in the fallopian tube. Rarely do ectopic pregnancies occur on the ovary, intestine, pelvis, or cervix. An ectopic pregnancy does not have the ability to develop into a normal, healthy baby.  A ruptured ectopic pregnancy is one in which the fallopian tube gets torn or bursts and results in internal bleeding. Often there is intense abdominal pain, and sometimes, vaginal bleeding. Having an ectopic pregnancy can be a life-threatening experience. If left untreated, this dangerous condition  can lead to a blood transfusion, abdominal surgery, or even death. CAUSES  Damage to the fallopian tubes is the suspected cause in most ectopic pregnancies.  RISK  FACTORS Depending on your circumstances, the amount of risk of having an ectopic pregnancy will vary. There are 3 categories that may help you identify whether you are potentially at risk. High Risk  You have gone through infertility treatment.  You have had a previous ectopic pregnancy.  You have had previous tubal surgery.  You have had previous surgery to have the fallopian tubes tied (tubal ligation).  You have tubal problems or diseases.  You have been exposed to DES. DES is a medicine that was used until 1971 and had effects on babies whose mothers took the medicine.  You become pregnant while using an intrauterine device (IUD) for birth control. Moderate Risk  You have a history of infertility.  You have a history of a sexually transmitted infection (STI).  You have a history of pelvic inflammatory disease (PID).  You have scarring from endometriosis.  You have multiple sexual partners.  You smoke. Low Risk  You have had previous pelvic surgery.  You use vaginal douching.  You became sexually active before 30 years of age. SYMPTOMS  An ectopic pregnancy should be suspected in anyone who has missed a period and has abdominal pain or bleeding.  You may experience normal pregnancy symptoms, such as:  Nausea.  Tiredness.  Breast tenderness.  Symptoms that are not normal include:  Pain with intercourse.  Irregular vaginal bleeding or spotting.  Cramping or pain on one side, or in the lower abdomen.  Fast heartbeat.  Passing out while having a bowel movement.  Symptoms of a ruptured ectopic pregnancy and internal bleeding may include:  Sudden, severe pain in the abdomen and pelvis.  Dizziness or fainting.  Pain in the shoulder area. DIAGNOSIS  Tests that may be performed include:  A pregnancy test.  An ultrasound.  Testing the specific level of pregnancy hormone in the bloodstream.  Taking a sample of uterus tissue (dilation and  curettage, D&C).  Surgery to perform a visual exam of the inside of the abdomen using a lighted tube (laparoscopy). TREATMENT  An injection of methotrexate medicine may be given. This is given if:  The diagnosis is made early.  The fallopian tube has not ruptured.  You are considered to be a good candidate for the medicine. Usually, pregnancy hormone blood levels are checked after methotrexate treatment. This is to be sure the medicine is effective. It may take 4 to 6 weeks for the pregnancy to be absorbed (though most pregnancies will be absorbed by 3 weeks). Surgical treatment may be needed. A lighted tube (laparoscope) is used to remove the tubal pregnancy. If severe internal bleeding occurs, a cut (incision) may be made in the lower abdomen (laparotomy), and the ectopic pregnancy is removed. This stops the bleeding. Part of the fallopian tube, or the whole tube, may be removed as well (salpingectomy). After surgery, pregnancy hormone tests may be done to be sure there is no pregnancy tissue left. You may receive a RhoGAM shot if you are Rh negative and the father is Rh positive, or if you do not know the Rh type of the father. This is to prevent problems with any future pregnancy. SEEK IMMEDIATE MEDICAL CARE IF:  You have any symptoms of an ectopic pregnancy. This is a medical emergency. Document Released: 08/05/2004 Document Revised: 09/20/2011 Document Reviewed: 01/25/2013 [employer] Patient Information  2014 [place], [place].  Urinary Tract Infection Urinary tract infections (UTIs) can develop anywhere along your urinary tract. Your urinary tract is your body's drainage system for removing wastes and extra water. Your urinary tract includes two kidneys, two ureters, a bladder, and a urethra. Your kidneys are a pair of bean-shaped organs. Each kidney is about the size of your fist. They are located below your ribs, one on each side of your spine. CAUSES Infections are caused by microbes, which  are microscopic organisms, including fungi, viruses, and bacteria. These organisms are so small that they can only be seen through a microscope. Bacteria are the microbes that most commonly cause UTIs. SYMPTOMS  Symptoms of UTIs may vary by age and gender of the patient and by the location of the infection. Symptoms in young women typically include a frequent and intense urge to urinate and a painful, burning feeling in the bladder or urethra during urination. Older women and men are more likely to be tired, shaky, and weak and have muscle aches and abdominal pain. A fever may mean the infection is in your kidneys. Other symptoms of a kidney infection include pain in your back or sides below the ribs, nausea, and vomiting. DIAGNOSIS To diagnose a UTI, your caregiver will ask you about your symptoms. Your caregiver also will ask to provide a urine sample. The urine sample will be tested for bacteria and white blood cells. White blood cells are made by your body to help fight infection. TREATMENT  Typically, UTIs can be treated with medication. Because most UTIs are caused by a bacterial infection, they usually can be treated with the use of antibiotics. The choice of antibiotic and length of treatment depend on your symptoms and the type of bacteria causing your infection. HOME CARE INSTRUCTIONS  If you were prescribed antibiotics, take them exactly as your caregiver instructs you. Finish the medication even if you feel better after you have only taken some of the medication.  Drink enough water and fluids to keep your urine clear or pale yellow.  Avoid caffeine, tea, and carbonated beverages. They tend to irritate your bladder.  Empty your bladder often. Avoid holding urine for long periods of time.  Empty your bladder before and after sexual intercourse.  After a bowel movement, women should cleanse from front to back. Use each tissue only once. SEEK MEDICAL CARE IF:   You have back  pain.  You develop a fever.  Your symptoms do not begin to resolve within 3 days. SEEK IMMEDIATE MEDICAL CARE IF:   You have severe back pain or lower abdominal pain.  You develop chills.  You have nausea or vomiting.  You have continued burning or discomfort with urination. MAKE SURE YOU:   Understand these instructions.  Will watch your condition.  Will get help right away if you are not doing well or get worse. Document Released: 04/07/2005 Document Revised: 12/28/2011 Document Reviewed: 08/06/2011 [facility] Patient Information 2014 [place], [place].

## 2013-07-21 ENCOUNTER — Inpatient Hospital Stay (HOSPITAL_COMMUNITY)
Admission: AD | Admit: 2013-07-21 | Discharge: 2013-07-21 | Disposition: A | Discharge status: Home / Self Care | Payer: Medicaid Other | Source: Ambulatory Visit | Attending: Obstetrics & Gynecology | Admitting: Obstetrics & Gynecology

## 2013-07-21 DIAGNOSIS — O99891 Other specified diseases and conditions complicating pregnancy: Secondary | ICD-10-CM | POA: Insufficient documentation

## 2013-07-21 DIAGNOSIS — O9989 Other specified diseases and conditions complicating pregnancy, childbirth and the puerperium: Principal | ICD-10-CM

## 2013-07-21 DIAGNOSIS — Z3490 Encounter for supervision of normal pregnancy, unspecified, unspecified trimester: Secondary | ICD-10-CM

## 2013-07-21 LAB — URINE CULTURE: Colony Count: >=100000

## 2013-07-21 LAB — HCG, QUANTITATIVE, PREGNANCY: hCG, Beta Chain, Quant, S: 1230 m[IU]/mL — ABNORMAL HIGH (ref <5)

## 2013-07-21 NOTE — MAU Provider Note (Signed)
Attestation of Attending Supervision of Advanced Practitioner (CNM/NP): Evaluation and management procedures were performed by the Advanced Practitioner under my supervision and collaboration.  I have reviewed the Advanced Practitioner's note and chart, and I agree with the management and plan.  HARRAWAY-SMITH, Shaundrea Carrigg 8:03 AM

## 2013-07-21 NOTE — MAU Note (Signed)
Pt presents for repeat HCG

## 2013-07-21 NOTE — Discharge Instructions (Signed)

## 2013-07-22 ENCOUNTER — Telehealth: Payer: Self-pay | Admitting: Family Medicine

## 2013-07-22 NOTE — Telephone Encounter (Signed)
Urine cx with >100,000 E. Coli. Pan sensitive.   Pt treated when in MAU with keflex. No further intervention needed at this time.

## 2013-07-23 ENCOUNTER — Inpatient Hospital Stay (HOSPITAL_COMMUNITY)
Admission: AD | Admit: 2013-07-23 | Discharge: 2013-07-24 | Disposition: A | Discharge status: Home / Self Care | Payer: Medicaid Other | Source: Ambulatory Visit | Attending: Family Medicine | Admitting: Family Medicine

## 2013-07-23 ENCOUNTER — Inpatient Hospital Stay (HOSPITAL_COMMUNITY): Payer: Medicaid Other

## 2013-07-23 DIAGNOSIS — O26899 Other specified pregnancy related conditions, unspecified trimester: Secondary | ICD-10-CM

## 2013-07-23 DIAGNOSIS — O99891 Other specified diseases and conditions complicating pregnancy: Secondary | ICD-10-CM | POA: Insufficient documentation

## 2013-07-23 DIAGNOSIS — R109 Unspecified abdominal pain: Secondary | ICD-10-CM | POA: Insufficient documentation

## 2013-07-23 DIAGNOSIS — O9989 Other specified diseases and conditions complicating pregnancy, childbirth and the puerperium: Principal | ICD-10-CM

## 2013-07-23 LAB — HCG, QUANTITATIVE, PREGNANCY: HCG, BETA CHAIN, QUANT, S: 3077 m[IU]/mL — AB (ref <5)

## 2013-07-23 NOTE — MAU Provider Note (Signed)
History     CSN: [id number]  Arrival date and time: 07/23/13 2155   None     Chief Complaint  Patient presents with  . Follow-up   HPI  Miranda Bowen is a 28 y.o. G3P1011 at [redacted]w[redacted]d who presents today for FU HCG. She denies any pain or bleeding. She had an ultrasound done on 07/19/13, and nothing was seen inside of the uterus. Her HCG has continued to double.   Past Medical History  Diagnosis Date  . No pertinent past medical history     Past Surgical History  Procedure Laterality Date  . Cesarean section      Family History  Problem Relation Age of Onset  . Hypertension Mother     History  Substance Use Topics  . Smoking status: Never Smoker   . Smokeless tobacco: Not on file  . Alcohol Use: No    Allergies: No Known Allergies  Prescriptions prior to admission  Medication Sig Dispense Refill  . cephALEXin (KEFLEX) 500 MG capsule Take 1 capsule (500 mg total) by mouth 4 (four) times daily.  28 capsule  0  . metroNIDAZOLE (FLAGYL) 500 MG tablet Take 1 tablet (500 mg total) by mouth 2 (two) times daily.  14 tablet  0  . promethazine (PHENERGAN) 25 MG tablet Take 1 tablet (25 mg total) by mouth every 6 (six) hours as needed for nausea or vomiting.  20 tablet  0    ROS Physical Exam   Blood pressure 124/71, pulse 93, temperature 98.4 F (36.9 C), temperature source Oral, resp. rate 16, height 5\' 7"  (1.702 m), weight 62.596 kg (138 lb), last menstrual period 06/06/2013, SpO2 100.00%.  Physical Exam  MAU Course  Procedures  Results for TANJA, GIFT (MRN [id number]) as of 07/23/2013 22:55  Ref. Range 07/19/2013 22:13 07/19/2013 22:50 07/19/2013 23:50 07/21/2013 13:29 07/23/2013 22:22  hCG, Beta Chain, Quant, S Latest Range: <5 mIU/mL 593 (H)   1230 (H) 3077 (H)   US Ob Transvaginal  07/23/2013   CLINICAL DATA:  Follow-up evaluation in patient who had ultrasound performed 4 days ago at which time an embryo was not visualized. G3 P1. LMP 06/06/2010 (6 weeks 5 days).  Quantitative beta HCG 3,077 (1,230 on 07/21/2013).  EXAM: TRANSVAGINAL OB ULTRASOUND  TECHNIQUE: Transvaginal ultrasound was performed for complete evaluation of the gestation as well as the maternal uterus, adnexal regions, and pelvic cul-de-sac.  COMPARISON:  07/19/2013.  FINDINGS: Intrauterine gestational sac: Single very small gestational sac, normal in appearance.  Yolk sac:  Visualized.  Embryo:  Not visualized.  Cardiac Activity: Not visualized.  Heart Rate: Not applicable.  MSD: 4.8  mm   5 w 0  d  [place] EDC: 03/25/2014.  Maternal uterus/adnexae: No evidence of subchorionic hemorrhage. Right ovary normal in size measuring approximately 4.1 x 2.4 x 2.8 cm, containing an approximate 1.8 x 1.4 x 1.3 cm complex corpus luteum cyst. Left ovary normal in size measuring approximately 2.5 x 1.3 x 1.9 cm. No adnexal masses. Trace, likely physiologic free pelvic fluid.  IMPRESSION: 1. Single intrauterine gestational sac which contains a yolk sac. No visible embryo at this time. Estimated gestational age [redacted] weeks 0 days by mean sac diameter. Ultrasound [facility] 03/25/2014. 2. No evidence of subchorionic hemorrhage. 3. Approximate 1.8 cm hemorrhagic corpus luteum cyst in the right ovary.   Electronically Signed   By: Hulan Saas M.D.   On: 07/23/2013 23:53    Assessment and Plan   1. Abdominal pain complicating  pregnancy    [facility] Return to [facility] as needed  Follow-up Information   Schedule an appointment as soon as possible [facility] [place]   Contact information:   [address] [place] [address] [phone]       Tawnya CrookHogan, Meagon Duskin Donovan 07/23/2013, 10:56 PM

## 2013-07-23 NOTE — MAU Note (Signed)
Pt presents for follow up bloodwork. Denies pain or bleeding.  

## 2013-07-24 ENCOUNTER — Ambulatory Visit: Payer: Medicaid Other

## 2013-07-24 DIAGNOSIS — R109 Unspecified abdominal pain: Secondary | ICD-10-CM

## 2013-07-24 DIAGNOSIS — O26899 Other specified pregnancy related conditions, unspecified trimester: Secondary | ICD-10-CM

## 2013-07-24 NOTE — MAU Provider Note (Signed)
Attestation of Attending Supervision of Advanced Practitioner (PA/CNM/NP): Evaluation and management procedures were performed by the Advanced Practitioner under my supervision and collaboration.  I have reviewed the Advanced Practitioner's note and chart, and I agree with the management and plan.  Jennfier Abdulla S, MD [facility] Attending 07/24/2013 12:51 AM   

## 2013-07-24 NOTE — Discharge Instructions (Signed)
Pregnancy - First Trimester  During sexual intercourse, millions of sperm go into the vagina. Only 1 sperm will penetrate and fertilize the female egg while it is in the Fallopian tube. One week later, the fertilized egg implants into the wall of the uterus. An embryo begins to develop into a baby. At 6 to 8 weeks, the eyes and face are formed and the heartbeat can be seen on ultrasound. At the end of 12 weeks (first trimester), all the baby's organs are formed. Now that you are pregnant, you will want to do everything you can to have a healthy baby. Two of the most important things are to get good prenatal care and follow your caregiver's instructions. Prenatal care is all the medical care you receive before the baby's birth. It is given to prevent, find, and treat problems during the pregnancy and childbirth.  PRENATAL EXAMS  · During prenatal visits, your weight, blood pressure, and urine are checked. This is done to make sure you are healthy and progressing normally during the pregnancy.  · A pregnant woman should gain 25 to 35 pounds during the pregnancy. However, if you are overweight or underweight, your caregiver will advise you regarding your weight.  · Your caregiver will ask and answer questions for you.  · Blood work, cervical cultures, other necessary tests, and a Pap test are done during your prenatal exams. These tests are done to check on your health and the probable health of your baby. Tests are strongly recommended and done for HIV with your permission. This is the virus that causes AIDS. These tests are done because medicines can be given to help prevent your baby from being born with this infection should you have been infected without knowing it. Blood work is also used to find out your blood type, previous infections, and follow your blood levels (hemoglobin).  · Low hemoglobin (anemia) is common during pregnancy. Iron and vitamins are given to help prevent this. Later in the pregnancy, blood  tests for diabetes will be done along with any other tests if any problems develop.  · You may need other tests to make sure you and the baby are doing well.  CHANGES DURING THE FIRST TRIMESTER   Your body goes through many changes during pregnancy. They vary from person to person. Talk to your caregiver about changes you notice and are concerned about. Changes can include:  · Your menstrual period stops.  · The egg and sperm carry the genes that determine what you look like. Genes from you and your partner are forming a baby. The female genes determine whether the baby is a boy or a girl.  · Your body increases in girth and you may feel bloated.  · Feeling sick to your stomach (nauseous) and throwing up (vomiting). If the vomiting is uncontrollable, call your caregiver.  · Your breasts will begin to enlarge and become tender.  · Your nipples may stick out more and become darker.  · The need to urinate more. Painful urination may mean you have a bladder infection.  · Tiring easily.  · Loss of appetite.  · Cravings for certain kinds of food.  · At first, you may gain or lose a couple of pounds.  · You may have changes in your emotions from day to day (excited to be pregnant or concerned something may go wrong with the pregnancy and baby).  · You may have more vivid and strange dreams.  HOME CARE INSTRUCTIONS   ·   It is very important to avoid all smoking, alcohol and non-prescribed drugs during your pregnancy. These affect the formation and growth of the baby. Avoid chemicals while pregnant to ensure the delivery of a healthy infant.  · Start your prenatal visits by the 12th week of pregnancy. They are usually scheduled monthly at first, then more often in the last 2 months before delivery. Keep your caregiver's appointments. Follow your caregiver's instructions regarding medicine use, blood and lab tests, exercise, and diet.  · During pregnancy, you are providing food for you and your baby. Eat regular, well-balanced  meals. Choose foods such as meat, fish, milk and other low fat dairy products, vegetables, fruits, and whole-grain breads and cereals. Your caregiver will tell you of the ideal weight gain.  · You can help morning sickness by keeping soda crackers at the bedside. Eat a couple before arising in the morning. You may want to use the crackers without salt on them.  · Eating 4 to 5 small meals rather than 3 large meals a day also may help the nausea and vomiting.  · Drinking liquids between meals instead of during meals also seems to help nausea and vomiting.  · A physical sexual relationship may be continued throughout pregnancy if there are no other problems. Problems may be early (premature) leaking of amniotic fluid from the membranes, vaginal bleeding, or belly (abdominal) pain.  · Exercise regularly if there are no restrictions. Check with your caregiver or physical therapist if you are unsure of the safety of some of your exercises. Greater weight gain will occur in the last 2 trimesters of pregnancy. Exercising will help:  · Control your weight.  · Keep you in shape.  · Prepare you for labor and delivery.  · Help you lose your pregnancy weight after you deliver your baby.  · Wear a good support or jogging bra for breast tenderness during pregnancy. This may help if worn during sleep too.  · Ask when prenatal classes are available. Begin classes when they are offered.  · Do not use hot tubs, steam rooms, or saunas.  · Wear your seat belt when driving. This protects you and your baby if you are in an accident.  · Avoid raw meat, uncooked cheese, cat litter boxes, and soil used by cats throughout the pregnancy. These carry germs that can cause birth defects in the baby.  · The first trimester is a good time to visit your dentist for your dental health. Getting your teeth cleaned is okay. Use a softer toothbrush and brush gently during pregnancy.  · Ask for help if you have financial, counseling, or nutritional needs  during pregnancy. Your caregiver will be able to offer counseling for these needs as well as refer you for other special needs.  · Do not take any medicines or herbs unless told by your caregiver.  · Inform your caregiver if there is any mental or physical domestic violence.  · Make a list of emergency phone numbers of family, friends, hospital, and police and fire departments.  · Write down your questions. Take them to your prenatal visit.  · Do not douche.  · Do not cross your legs.  · If you have to stand for long periods of time, rotate you feet or take small steps in a circle.  · You may have more vaginal secretions that may require a sanitary pad. Do not use tampons or scented sanitary pads.  MEDICINES AND DRUG USE IN PREGNANCY  ·   Take prenatal vitamins as directed. The vitamin should contain 1 milligram of folic acid. Keep all vitamins out of reach of children. Only a couple vitamins or tablets containing iron may be fatal to a baby or young child when ingested.  · Avoid use of all medicines, including herbs, over-the-counter medicines, not prescribed or suggested by your caregiver. Only take over-the-counter or prescription medicines for pain, discomfort, or fever as directed by your caregiver. Do not use aspirin, ibuprofen, or naproxen unless directed by your caregiver.  · Let your caregiver also know about herbs you may be using.  · Alcohol is related to a number of birth defects. This includes fetal alcohol syndrome. All alcohol, in any form, should be avoided completely. Smoking will cause low birth rate and premature babies.  · Street or illegal drugs are very harmful to the baby. They are absolutely forbidden. A baby born to an addicted mother will be addicted at birth. The baby will go through the same withdrawal an adult does.  · Let your caregiver know about any medicines that you have to take and for what reason you take them.  SEEK MEDICAL CARE IF:   You have any concerns or worries during your  pregnancy. It is better to call with your questions if you feel they cannot wait, rather than worry about them.  SEEK IMMEDIATE MEDICAL CARE IF:   · An unexplained oral temperature above 102° F (38.9° C) develops, or as your caregiver suggests.  · You have leaking of fluid from the vagina (birth canal). If leaking membranes are suspected, take your temperature and inform your caregiver of this when you call.  · There is vaginal spotting or bleeding. Notify your caregiver of the amount and how many pads are used.  · You develop a bad smelling vaginal discharge with a change in the color.  · You continue to feel sick to your stomach (nauseated) and have no relief from remedies suggested. You vomit blood or coffee ground-like materials.  · You lose more than 2 pounds of weight in 1 week.  · You gain more than 2 pounds of weight in 1 week and you notice swelling of your face, hands, feet, or legs.  · You gain 5 pounds or more in 1 week (even if you do not have swelling of your hands, face, legs, or feet).  · You get exposed to German measles and have never had them.  · You are exposed to fifth disease or chickenpox.  · You develop belly (abdominal) pain. Round ligament discomfort is a common non-cancerous (benign) cause of abdominal pain in pregnancy. Your caregiver still must evaluate this.  · You develop headache, fever, diarrhea, pain with urination, or shortness of breath.  · You fall or are in a car accident or have any kind of trauma.  · There is mental or physical violence in your home.  Document Released: 06/22/2001 Document Revised: 03/22/2012 Document Reviewed: 12/24/2008  [employer]® Patient Information ©2014 [employer].

## 2013-07-25 ENCOUNTER — Encounter: Payer: Self-pay | Admitting: Advanced Practice Midwife

## 2013-08-04 ENCOUNTER — Inpatient Hospital Stay (HOSPITAL_COMMUNITY)
Admission: AD | Admit: 2013-08-04 | Discharge: 2013-08-04 | Disposition: A | Discharge status: Home / Self Care | Payer: Medicaid Other | Source: Ambulatory Visit | Attending: Obstetrics & Gynecology | Admitting: Obstetrics & Gynecology

## 2013-08-04 ENCOUNTER — Encounter (HOSPITAL_COMMUNITY): Payer: Self-pay | Admitting: *Deleted

## 2013-08-04 DIAGNOSIS — O9989 Other specified diseases and conditions complicating pregnancy, childbirth and the puerperium: Principal | ICD-10-CM

## 2013-08-04 DIAGNOSIS — M549 Dorsalgia, unspecified: Secondary | ICD-10-CM | POA: Insufficient documentation

## 2013-08-04 DIAGNOSIS — O99891 Other specified diseases and conditions complicating pregnancy: Secondary | ICD-10-CM | POA: Insufficient documentation

## 2013-08-04 NOTE — [facility] Provider Note (Signed)
History     CSN: [id number]  Arrival date and time: 08/04/13 1609   First Provider Initiated Contact with Patient 08/04/13 1634      Chief Complaint  Patient presents with  . Back Pain   HPI Miranda Bowen 27 y.o. [redacted]w[redacted]d  Comes to [facility] with some back pain and wants to make sure her baby is OK as her prenatal care begins on Feb. 17.  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  Admits she has been lifting toddlers.  Denies any lower abdominal pain.  Denies any dysuria, vaginal bleeding, or vaginal discharge.    OB History   Grav Para Term Preterm Abortions TAB SAB Ect Mult Living   3 1 1  1  1   1       Past Medical History  Diagnosis Date  . No pertinent past medical history     Past Surgical History  Procedure Laterality Date  . Cesarean section      Family History  Problem Relation Age of Onset  . Hypertension Mother     History  Substance Use Topics  . Smoking status: Never Smoker   . Smokeless tobacco: Not on file  . Alcohol Use: No    Allergies: No Known Allergies  Prescriptions prior to admission  Medication Sig Dispense Refill  . promethazine (PHENERGAN) 25 MG tablet Take 1 tablet (25 mg total) by mouth every 6 (six) hours as needed for nausea or vomiting.  20 tablet  0  . metroNIDAZOLE (FLAGYL) 500 MG tablet Take 1 tablet (500 mg total) by mouth 2 (two) times daily.  14 tablet  0    Review of Systems  Constitutional: Negative for fever.  Gastrointestinal: Positive for nausea. Negative for vomiting and abdominal pain.  Genitourinary:       No vaginal discharge. No vaginal bleeding. No dysuria.  Musculoskeletal: Positive for back pain.   Physical Exam   Blood pressure 130/75, pulse 96, temperature 98.5 F (36.9 C), temperature source Oral, resp. rate 18, height 5\' 7"  (1.702 m), weight 138 lb (62.596 kg), last menstrual period 06/06/2013.  Physical Exam  Nursing note and vitals reviewed. Constitutional: She is oriented to person,  place, and time. She appears well-developed and well-nourished. No distress.  HENT:  Head: Normocephalic.  Eyes: EOM are normal.  Neck: Neck supple.  Respiratory: Effort normal.  GI: Soft. There is no tenderness.  Fundus not palpated.  Musculoskeletal: Normal range of motion.  Back has some pain in the midline above the level of the sacrum.  Pain does not worsen with palpation.  No masses palpated.  Neurological: She is alert and oriented to person, place, and time.  Skin: Skin is warm and dry.  Psychiatric: She has a normal mood and affect.    [facility] Course  Procedures Previous chart reviewed.  IUP confirmed by previous ultrasound.  MDM Explained to client that she is too early to hear a baby's heart rate and that will be evaluated when she begins prenatal care.  Client declines the offer of Tylenol today to help her with pain.  States she has some at home.  Assessment and Plan  Back pain in pregnancy  Plan Take Tylenol 325 mg 2 tablets by mouth every 4 hours if needed for pain.  Avoid lifting toddlers at work. Let them walk instead of you lifting them.  Do some back strengthening exercises.  You can also use an ice pack on your back for  10 minutes. Use a cloth between the ice and your skin.  Return if you have severe abdominal pain or vaginal bleeding.  No smoking, no drugs, no alcohol.  Take a prenatal vitamin one by mouth every day.  Eat small frequent snacks to avoid nausea.  Keep your appointment to begin prenatal care.    F[place]er [place] 08/04/2013, 5:02 PM

## 2013-08-04 NOTE — Discharge Instructions (Signed)
Take Tylenol 325 mg 2 tablets by mouth every 4 hours if needed for pain. Avoid lifting toddlers at work.  Let them walk instead of you lifting them. Do some back strengthening exercises. You can also use an ice pack on your back for 10 minutes.  Use a cloth between the ice and your skin. Return if you have severe abdominal pain or vaginal bleeding. No smoking, no drugs, no alcohol.   Take a prenatal vitamin one by mouth every day.   Eat small frequent snacks to avoid nausea. Keep your appointment to begin prenatal care.

## 2013-08-04 NOTE — [facility] Note (Signed)
Pt presents with complaints of back pain that started today. 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

## 2013-08-28 ENCOUNTER — Encounter: Payer: Self-pay | Admitting: Obstetrics and Gynecology

## 2013-08-28 ENCOUNTER — Ambulatory Visit (INDEPENDENT_AMBULATORY_CARE_PROVIDER_SITE_OTHER): Payer: Medicaid Other | Admitting: Advanced Practice Midwife

## 2013-08-28 ENCOUNTER — Encounter: Payer: Self-pay | Admitting: Advanced Practice Midwife

## 2013-08-28 VITALS — BP 113/76 | Temp 98.0°F | Ht 66.0 in | Wt 142.3 lb

## 2013-08-28 DIAGNOSIS — Z3480 Encounter for supervision of other normal pregnancy, unspecified trimester: Secondary | ICD-10-CM

## 2013-08-28 DIAGNOSIS — Z23 Encounter for immunization: Secondary | ICD-10-CM

## 2013-08-28 DIAGNOSIS — O34219 Maternal care for unspecified type scar from previous cesarean delivery: Secondary | ICD-10-CM

## 2013-08-28 DIAGNOSIS — Z3490 Encounter for supervision of normal pregnancy, unspecified, unspecified trimester: Secondary | ICD-10-CM

## 2013-08-28 DIAGNOSIS — O3421 Maternal care for scar from previous cesarean delivery: Secondary | ICD-10-CM

## 2013-08-28 LAB — POCT URINALYSIS DIP (DEVICE)
Bilirubin Urine: NEGATIVE
GLUCOSE, UA: NEGATIVE mg/dL
Ketones, ur: NEGATIVE mg/dL
Leukocytes, UA: NEGATIVE
NITRITE: NEGATIVE
Protein, ur: NEGATIVE mg/dL
Specific Gravity, Urine: 1.025 (ref 1.005–1.030)
UROBILINOGEN UA: 1 mg/dL (ref 0.0–1.0)
pH: 7.5 (ref 5.0–8.0)

## 2013-08-28 LAB — HIV ANTIBODY (ROUTINE TESTING W REFLEX): HIV: NONREACTIVE

## 2013-08-28 MED ORDER — PRENATAL VITAMINS 0.8 MG PO TABS: 1.0000 | ORAL_TABLET | Freq: Every day | ORAL | Status: DC

## 2013-08-28 NOTE — Progress Notes (Signed)
P=100, Given new patient information. Discussed bmi/ appropriate weight gain. Declines flu shot.

## 2013-08-28 NOTE — Patient Instructions (Signed)
Pregnancy - First Trimester  During sexual intercourse, millions of sperm go into the vagina. Only 1 sperm will penetrate and fertilize the female egg while it is in the Fallopian tube. One week later, the fertilized egg implants into the wall of the uterus. An embryo begins to develop into a baby. At 6 to 8 weeks, the eyes and face are formed and the heartbeat can be seen on ultrasound. At the end of 12 weeks (first trimester), all the baby's organs are formed. Now that you are pregnant, you will want to do everything you can to have a healthy baby. Two of the most important things are to get good prenatal care and follow your caregiver's instructions. Prenatal care is all the medical care you receive before the baby's birth. It is given to prevent, find, and treat problems during the pregnancy and childbirth.  PRENATAL EXAMS  · During prenatal visits, your weight, blood pressure, and urine are checked. This is done to make sure you are healthy and progressing normally during the pregnancy.  · A pregnant woman should gain 25 to 35 pounds during the pregnancy. However, if you are overweight or underweight, your caregiver will advise you regarding your weight.  · Your caregiver will ask and answer questions for you.  · Blood work, cervical cultures, other necessary tests, and a Pap test are done during your prenatal exams. These tests are done to check on your health and the probable health of your baby. Tests are strongly recommended and done for HIV with your permission. This is the virus that causes AIDS. These tests are done because medicines can be given to help prevent your baby from being born with this infection should you have been infected without knowing it. Blood work is also used to find out your blood type, previous infections, and follow your blood levels (hemoglobin).  · Low hemoglobin (anemia) is common during pregnancy. Iron and vitamins are given to help prevent this. Later in the pregnancy, blood  tests for diabetes will be done along with any other tests if any problems develop.  · You may need other tests to make sure you and the baby are doing well.  CHANGES DURING THE FIRST TRIMESTER   Your body goes through many changes during pregnancy. They vary from person to person. Talk to your caregiver about changes you notice and are concerned about. Changes can include:  · Your menstrual period stops.  · The egg and sperm carry the genes that determine what you look like. Genes from you and your partner are forming a baby. The female genes determine whether the baby is a boy or a girl.  · Your body increases in girth and you may feel bloated.  · Feeling sick to your stomach (nauseous) and throwing up (vomiting). If the vomiting is uncontrollable, call your caregiver.  · Your breasts will begin to enlarge and become tender.  · Your nipples may stick out more and become darker.  · The need to urinate more. Painful urination may mean you have a bladder infection.  · Tiring easily.  · Loss of appetite.  · Cravings for certain kinds of food.  · At first, you may gain or lose a couple of pounds.  · You may have changes in your emotions from day to day (excited to be pregnant or concerned something may go wrong with the pregnancy and baby).  · You may have more vivid and strange dreams.  HOME CARE INSTRUCTIONS   ·   It is very important to avoid all smoking, alcohol and non-prescribed drugs during your pregnancy. These affect the formation and growth of the baby. Avoid chemicals while pregnant to ensure the delivery of a healthy infant.  · Start your prenatal visits by the 12th week of pregnancy. They are usually scheduled monthly at first, then more often in the last 2 months before delivery. Keep your caregiver's appointments. Follow your caregiver's instructions regarding medicine use, blood and lab tests, exercise, and diet.  · During pregnancy, you are providing food for you and your baby. Eat regular, well-balanced  meals. Choose foods such as meat, fish, milk and other low fat dairy products, vegetables, fruits, and whole-grain breads and cereals. Your caregiver will tell you of the ideal weight gain.  · You can help morning sickness by keeping soda crackers at the bedside. Eat a couple before arising in the morning. You may want to use the crackers without salt on them.  · Eating 4 to 5 small meals rather than 3 large meals a day also may help the nausea and vomiting.  · Drinking liquids between meals instead of during meals also seems to help nausea and vomiting.  · A physical sexual relationship may be continued throughout pregnancy if there are no other problems. Problems may be early (premature) leaking of amniotic fluid from the membranes, vaginal bleeding, or belly (abdominal) pain.  · Exercise regularly if there are no restrictions. Check with your caregiver or physical therapist if you are unsure of the safety of some of your exercises. Greater weight gain will occur in the last 2 trimesters of pregnancy. Exercising will help:  · Control your weight.  · Keep you in shape.  · Prepare you for labor and delivery.  · Help you lose your pregnancy weight after you deliver your baby.  · Wear a good support or jogging bra for breast tenderness during pregnancy. This may help if worn during sleep too.  · Ask when prenatal classes are available. Begin classes when they are offered.  · Do not use hot tubs, steam rooms, or saunas.  · Wear your seat belt when driving. This protects you and your baby if you are in an accident.  · Avoid raw meat, uncooked cheese, cat litter boxes, and soil used by cats throughout the pregnancy. These carry germs that can cause birth defects in the baby.  · The first trimester is a good time to visit your dentist for your dental health. Getting your teeth cleaned is okay. Use a softer toothbrush and brush gently during pregnancy.  · Ask for help if you have financial, counseling, or nutritional needs  during pregnancy. Your caregiver will be able to offer counseling for these needs as well as refer you for other special needs.  · Do not take any medicines or herbs unless told by your caregiver.  · Inform your caregiver if there is any mental or physical domestic violence.  · Make a list of emergency phone numbers of family, friends, hospital, and police and fire departments.  · Write down your questions. Take them to your prenatal visit.  · Do not douche.  · Do not cross your legs.  · If you have to stand for long periods of time, rotate you feet or take small steps in a circle.  · You may have more vaginal secretions that may require a sanitary pad. Do not use tampons or scented sanitary pads.  MEDICINES AND DRUG USE IN PREGNANCY  ·   Take prenatal vitamins as directed. The vitamin should contain 1 milligram of folic acid. Keep all vitamins out of reach of children. Only a couple vitamins or tablets containing iron may be fatal to a baby or young child when ingested.  · Avoid use of all medicines, including herbs, over-the-counter medicines, not prescribed or suggested by your caregiver. Only take over-the-counter or prescription medicines for pain, discomfort, or fever as directed by your caregiver. Do not use aspirin, ibuprofen, or naproxen unless directed by your caregiver.  · Let your caregiver also know about herbs you may be using.  · Alcohol is related to a number of birth defects. This includes fetal alcohol syndrome. All alcohol, in any form, should be avoided completely. Smoking will cause low birth rate and premature babies.  · Street or illegal drugs are very harmful to the baby. They are absolutely forbidden. A baby born to an addicted mother will be addicted at birth. The baby will go through the same withdrawal an adult does.  · Let your caregiver know about any medicines that you have to take and for what reason you take them.  SEEK MEDICAL CARE IF:   You have any concerns or worries during your  pregnancy. It is better to call with your questions if you feel they cannot wait, rather than worry about them.  SEEK IMMEDIATE MEDICAL CARE IF:   · An unexplained oral temperature above 102° F (38.9° C) develops, or as your caregiver suggests.  · You have leaking of fluid from the vagina (birth canal). If leaking membranes are suspected, take your temperature and inform your caregiver of this when you call.  · There is vaginal spotting or bleeding. Notify your caregiver of the amount and how many pads are used.  · You develop a bad smelling vaginal discharge with a change in the color.  · You continue to feel sick to your stomach (nauseated) and have no relief from remedies suggested. You vomit blood or coffee ground-like materials.  · You lose more than 2 pounds of weight in 1 week.  · You gain more than 2 pounds of weight in 1 week and you notice swelling of your face, hands, feet, or legs.  · You gain 5 pounds or more in 1 week (even if you do not have swelling of your hands, face, legs, or feet).  · You get exposed to German measles and have never had them.  · You are exposed to fifth disease or chickenpox.  · You develop belly (abdominal) pain. Round ligament discomfort is a common non-cancerous (benign) cause of abdominal pain in pregnancy. Your caregiver still must evaluate this.  · You develop headache, fever, diarrhea, pain with urination, or shortness of breath.  · You fall or are in a car accident or have any kind of trauma.  · There is mental or physical violence in your home.  Document Released: 06/22/2001 Document Revised: 03/22/2012 Document Reviewed: 12/24/2008  [employer]® Patient Information ©2014 [employer].

## 2013-08-28 NOTE — Progress Notes (Signed)
New OB. See Smart Set  Subjective:    Miranda Bowen is a [id number] 5831w1d being seen today for her first obstetrical visit.  Her obstetrical history is significant for Previous C/S for breech, desires TOLAC. Patient does intend to breast feed. Pregnancy history fully reviewed.  Patient reports nausea.  Filed Vitals:   08/28/13 1338 08/28/13 1342  BP: 113/76   Temp: 98 F (36.7 C)   Height:  5\' 6"  (1.676 m)  Weight: 64.547 kg (142 lb 4.8 oz)     HISTORY: OB History  Gravida Para Term Preterm AB SAB TAB Ectopic Multiple Living  3 1 1  1 1    1     # Outcome Date GA Lbr Len/2nd Weight Sex Delivery Anes PTL Lv  3 CUR           2 TRM 2006 5135w3d  3.515 kg (7 lb 12 oz) M CS Spinal       Comments: c/s " because he flipped over", no other complications except [facility] too much weight[facility]              Past Medical History  Diagnosis Date  . No pertinent past medical history    Past Surgical History  Procedure Laterality Date  . Cesarean section     Family History  Problem Relation Age of Onset  . Hypertension Mother      Exam    Uterus:  Fundal Height: 10 cm  Pelvic Exam:    Perineum: No Hemorrhoids, Normal Perineum   Vulva: Bartholin's, Urethra, Skene's normal   Vagina:  normal mucosa, normal discharge   pH:    Cervix: no bleeding following Pap and no cervical motion tenderness   Adnexa: no mass, fullness, tenderness   Bony Pelvis: gynecoid and Low Pubic Arch, leans toward anthropoid, deep, slightly narrowed  System: Breast:  normal appearance, no masses or tenderness, Inspection negative, No nipple retraction or dimpling, No nipple discharge or bleeding   Skin: normal coloration and turgor, no rashes    Neurologic: oriented, grossly non-focal   Extremities: normal strength, tone, and muscle mass   HEENT neck supple with midline trachea   Mouth/Teeth mucous membranes moist, pharynx normal without lesions   Neck supple and no masses   Cardiovascular:  regular rate and rhythm, no murmurs or gallops   Respiratory:  appears well, vitals normal, no respiratory distress, acyanotic, normal RR, ear and throat exam is normal, neck free of mass or lymphadenopathy, chest clear, no wheezing, crepitations, rhonchi, normal symmetric air entry   Abdomen: soft, non-tender; bowel sounds normal; no masses,  no organomegaly   Urinary: urethral meatus normal      Assessment:    Pregnancy: [id number] Patient Active Problem List   Diagnosis Date Noted  . H/O cesarean section complicating pregnancy 08/28/2013    SIUP at 6931w1d     Plan:     Initial labs drawn. Prenatal vitamins. Problem list reviewed and updated. Genetic Screening discussed Integrated Screen: declined.  Ultrasound discussed; fetal survey: declined.  Declined first trimester NT US but will do anatomy US  Follow up in 4 weeks. 50% of 30 min visit spent on counseling and coordination of care.   Desires TOLAC. Reviewed risk of uterine rupture, still wants to try. Will have her sign VBAC consent form Pap done   [facility]WILLIAMS,MARIE 08/28/2013

## 2013-08-29 LAB — PRESCRIPTION MONITORING PROFILE (19 PANEL)
Amphetamine/Meth: NEGATIVE ng/mL
BENZODIAZEPINE SCREEN, URINE: NEGATIVE ng/mL
Barbiturate Screen, Urine: NEGATIVE ng/mL
Buprenorphine, Urine: NEGATIVE ng/mL
CARISOPRODOL, URINE: NEGATIVE ng/mL
Cannabinoid Scrn, Ur: NEGATIVE ng/mL
Cocaine Metabolites: NEGATIVE ng/mL
Creatinine, Urine: 189.07 mg/dL (ref >20.0)
ECSTASY: NEGATIVE ng/mL
Fentanyl, Ur: NEGATIVE ng/mL
MEPERIDINE UR: NEGATIVE ng/mL
METHADONE SCREEN, URINE: NEGATIVE ng/mL
Methaqualone: NEGATIVE ng/mL
NITRITES URINE, INITIAL: NEGATIVE ug/mL
OXYCODONE SCRN UR: NEGATIVE ng/mL
Opiate Screen, Urine: NEGATIVE ng/mL
PH URINE, INITIAL: 8.2 pH (ref 4.5–8.9)
Phencyclidine, Ur: NEGATIVE ng/mL
Propoxyphene: NEGATIVE ng/mL
TRAMADOL UR: NEGATIVE ng/mL
Tapentadol, urine: NEGATIVE ng/mL
ZOLPIDEM, URINE: NEGATIVE ng/mL

## 2013-08-29 LAB — OBSTETRIC PANEL
Antibody Screen: NEGATIVE
Basophils Absolute: 0 10*3/uL (ref 0.0–0.1)
Basophils Relative: 0 % (ref 0–1)
EOS ABS: 0.3 10*3/uL (ref 0.0–0.7)
Eosinophils Relative: 3 % (ref 0–5)
HCT: 33.6 % — ABNORMAL LOW (ref 36.0–46.0)
Hemoglobin: 11.6 g/dL — ABNORMAL LOW (ref 12.0–15.0)
Hepatitis B Surface Ag: NEGATIVE
LYMPHS ABS: 1.9 10*3/uL (ref 0.7–4.0)
LYMPHS PCT: 21 % (ref 12–46)
MCH: 29.8 pg (ref 26.0–34.0)
MCHC: 34.5 g/dL (ref 30.0–36.0)
MCV: 86.4 fL (ref 78.0–100.0)
Monocytes Absolute: 1 10*3/uL (ref 0.1–1.0)
Monocytes Relative: 11 % (ref 3–12)
Neutro Abs: 5.9 10*3/uL (ref 1.7–7.7)
Neutrophils Relative %: 65 % (ref 43–77)
PLATELETS: 387 10*3/uL (ref 150–400)
RBC: 3.89 MIL/uL (ref 3.87–5.11)
RDW: 13.4 % (ref 11.5–15.5)
Rh Type: POSITIVE
Rubella: 1.33 Index — ABNORMAL HIGH (ref <0.90)
WBC: 8.9 10*3/uL (ref 4.0–10.5)

## 2013-08-29 LAB — CULTURE, OB URINE
COLONY COUNT: NO GROWTH
Organism ID, Bacteria: NO GROWTH

## 2013-08-30 LAB — HEMOGLOBINOPATHY EVALUATION
HGB A: 96.9 % (ref 96.8–97.8)
Hemoglobin Other: 0 %
Hgb A2 Quant: 3.1 % (ref 2.2–3.2)
Hgb F Quant: 0 % (ref 0.0–2.0)
Hgb S Quant: 0 %

## 2013-09-14 ENCOUNTER — Inpatient Hospital Stay (HOSPITAL_COMMUNITY)
Admission: AD | Admit: 2013-09-14 | Discharge: 2013-09-14 | Disposition: A | Discharge status: Home / Self Care | Payer: Medicaid Other | Source: Ambulatory Visit | Attending: Obstetrics & Gynecology | Admitting: Obstetrics & Gynecology

## 2013-09-14 ENCOUNTER — Encounter (HOSPITAL_COMMUNITY): Payer: Self-pay | Admitting: *Deleted

## 2013-09-14 DIAGNOSIS — R110 Nausea: Secondary | ICD-10-CM

## 2013-09-14 DIAGNOSIS — O99891 Other specified diseases and conditions complicating pregnancy: Secondary | ICD-10-CM | POA: Insufficient documentation

## 2013-09-14 DIAGNOSIS — O210 Mild hyperemesis gravidarum: Secondary | ICD-10-CM | POA: Insufficient documentation

## 2013-09-14 DIAGNOSIS — O34219 Maternal care for unspecified type scar from previous cesarean delivery: Secondary | ICD-10-CM

## 2013-09-14 DIAGNOSIS — M549 Dorsalgia, unspecified: Secondary | ICD-10-CM

## 2013-09-14 DIAGNOSIS — O9989 Other specified diseases and conditions complicating pregnancy, childbirth and the puerperium: Secondary | ICD-10-CM

## 2013-09-14 DIAGNOSIS — M62838 Other muscle spasm: Secondary | ICD-10-CM | POA: Insufficient documentation

## 2013-09-14 LAB — URINALYSIS, ROUTINE W REFLEX MICROSCOPIC
BILIRUBIN URINE: NEGATIVE
GLUCOSE, UA: NEGATIVE mg/dL
Hgb urine dipstick: NEGATIVE
KETONES UR: NEGATIVE mg/dL
Leukocytes, UA: NEGATIVE
Nitrite: NEGATIVE
PH: 7 (ref 5.0–8.0)
Protein, ur: NEGATIVE mg/dL
Specific Gravity, Urine: 1.02 (ref 1.005–1.030)
Urobilinogen, UA: 0.2 mg/dL (ref 0.0–1.0)

## 2013-09-14 MED ORDER — PROMETHAZINE HCL 25 MG PO TABS: 25.0000 mg | ORAL_TABLET | Freq: Four times a day (QID) | ORAL | Status: DC | PRN

## 2013-09-14 MED ORDER — CYCLOBENZAPRINE HCL 10 MG PO TABS: 10.0000 mg | ORAL_TABLET | Freq: Three times a day (TID) | ORAL | Status: DC | PRN

## 2013-09-14 NOTE — Discharge Instructions (Signed)
Back Injury Prevention The following tips can help you to prevent a back injury. PHYSICAL FITNESS  Exercise often. Try to develop strong stomach (abdominal) muscles.  Do aerobic exercises often. This includes walking, jogging, biking, swimming.  Do exercises that help with balance and strength often. This includes tai chi and yoga.  Stretch before and after you exercise.  Keep a healthy weight. DIET   Ask your doctor how much calcium and vitamin D you need every day.  Include calcium in your diet. Foods high in calcium include dairy products; green, leafy vegetables; and products with calcium added (fortified).  Include vitamin D in your diet. Foods high in vitamin D include milk and products with vitamin D added.  Think about taking a multivitamin or other nutritional products called " supplements."  Stop smoking if you smoke. POSTURE   Sit and stand up straight. Avoid leaning forward or hunching over.  Choose chairs that support your lower back.  If you work at a desk:  Sit close to your work so you do not lean over.  Keep your chin tucked in.  Keep your neck drawn back.  Keep your elbows bent at a right angle. Your arms should look like the letter "L."  Sit high and close to the steering wheel when you drive. Add low back support to your car seat if needed.  Avoid sitting or standing in one position for too long. Get up and move around every hour. Take breaks if you are driving for a long time.  Sleep on your side with your knees slightly bent. You can also sleep on your back with a pillow under your knees. Do not sleep on your stomach. LIFTING, TWISTING, AND REACHING  Avoid heavy lifting, especially lifting over and over again. If you must do heavy lifting:  Stretch before lifting.  Work slowly.  Rest between lifts.  Use carts and dollies to move objects when possible.  Make several small trips instead of carrying 1 heavy load.  Ask for help when you  need it.  Ask for help when moving big, awkward objects.  Follow these steps when lifting:  Stand with your feet shoulder-width apart.  Get as close to the object as you can. Do not pick up heavy objects that are far from your body.  Use handles or lifting straps when possible.  Bend at your knees. Squat down, but keep your heels off the floor.  Keep your shoulders back, your chin tucked in, and your back straight.  Lift the object slowly. Tighten the muscles in your legs, stomach, and butt. Keep the object as close to the center of your body as possible.  Reverse these directions when you put a load down.  Do not:  Lift the object above your waist.  Twist at the waist while lifting or carrying a load. Move your feet if you need to turn, not your waist.  Bend over without bending at your knees.  Avoid reaching over your head, across a table, or for an object on a high surface. OTHER TIPS  Avoid wet floors and keep sidewalks clear of ice.  Do not sleep on a mattress that is too soft or too hard.  Keep items that you use often within easy reach.  Put heavier objects on shelves at waist level. Put lighter objects on lower or higher shelves.  Find ways to lessen your stress. You can try exercise, massage, or relaxation.  Get help for depression or anxiety if  needed. GET HELP IF:  You injure your back.  You have questions about diet, exercise, or other ways to prevent back injuries. MAKE SURE YOU:  Understand these instructions.  Will watch your condition.  Will get help right away if you are not doing well or get worse. Document Released: 12/15/2007 Document Revised: 09/20/2011 Document Reviewed: 08/09/2011 [facility] Patient Information 2014 [place].  Back Pain in Pregnancy Back pain during pregnancy is common. It happens in about half of all pregnancies. It is important for you and your baby that you remain active during your pregnancy.If you feel that  back pain is not allowing you to remain active or sleep well, it is time to see your caregiver. Back pain may be caused by several factors related to changes during your pregnancy.Fortunately, unless you had trouble with your back before your pregnancy, the pain is likely to get better after you deliver. Low back pain usually occurs between the fifth and seventh months of pregnancy. It can, however, happen in the first couple months. Factors that increase the risk of back problems include:   Previous back problems.  Injury to your back.  Having twins or multiple births.  A chronic cough.  Stress.  Job-related repetitive motions.  Muscle or spinal disease in the back.  Family history of back problems, ruptured (herniated) discs, or osteoporosis.  Depression, anxiety, and panic attacks. CAUSES   When you are pregnant, your body produces a hormone called relaxin. This hormonemakes the ligaments connecting the low back and pubic bones more flexible. This flexibility allows the baby to be delivered more easily. When your ligaments are loose, your muscles need to work harder to support your back. Soreness in your back can come from tired muscles. Soreness can also come from back tissues that are irritated since they are receiving less support.  As the baby grows, it puts pressure on the nerves and blood vessels in your pelvis. This can cause back pain.  As the baby grows and gets heavier during pregnancy, the uterus pushes the stomach muscles forward and changes your center of gravity. This makes your back muscles work harder to maintain good posture. SYMPTOMS  Lumbar pain during pregnancy Lumbar pain during pregnancy usually occurs at or above the waist in the center of the back. There may be pain and numbness that radiates into your leg or foot. This is similar to low back pain experienced by non-pregnant women. It usually increases with sitting for long periods of time, standing, or  repetitive lifting. Tenderness may also be present in the muscles along your upper back. Posterior pelvic pain during pregnancy Pain in the back of the pelvis is more common than lumbar pain in pregnancy. It is a deep pain felt in your side at the waistline, or across the tailbone (sacrum), or in both places. You may have pain on one or both sides. This pain can also go into the buttocks and backs of the upper thighs. Pubic and groin pain may also be present. The pain does not quickly resolve with rest, and morning stiffness may also be present. Pelvic pain during pregnancy can be brought on by most activities. A high level of fitness before and during pregnancy may or may not prevent this problem. Labor pain is usually 1 to 2 minutes apart, lasts for about 1 minute, and involves a bearing down feeling or pressure in your pelvis. However, if you are at term with the pregnancy, constant low back pain can be the  beginning of early labor, and you should be aware of this. DIAGNOSIS  X-rays of the back should not be done during the first 12 to 14 weeks of the pregnancy and only when absolutely necessary during the rest of the pregnancy. MRIs do not give off radiation and are safe during pregnancy. MRIs also should only be done when absolutely necessary. HOME CARE INSTRUCTIONS  Exercise as directed by your caregiver. Exercise is the most effective way to prevent or manage back pain. If you have a back problem, it is especially important to avoid sports that require sudden body movements. Swimming and walking are great activities.  Do not stand in one place for long periods of time.  Do not wear high heels.  Sit in chairs with good posture. Use a pillow on your lower back if necessary. Make sure your head rests over your shoulders and is not hanging forward.  Try sleeping on your side, preferably the left side, with a pillow or two between your legs. If you are sore after a night's rest, your bedmay betoo  soft.Try placing a board between your mattress and box spring.  Listen to your body when lifting.If you are experiencing pain, ask for help or try bending yourknees more so you can use your leg muscles rather than your back muscles. Squat down when picking up something from the floor. Do not bend over.  Eat a healthy diet. Try to gain weight within your caregiver's recommendations.  Use heat or cold packs 3 to 4 times a day for 15 minutes to help with the pain.  Only take over-the-counter or prescription medicines for pain, discomfort, or fever as directed by your caregiver. Sudden (acute) back pain  Use bed rest for only the most extreme, acute episodes of back pain. Prolonged bed rest over 48 hours will aggravate your condition.  Ice is very effective for acute conditions.  Put ice in a plastic bag.  Place a towel between your skin and the bag.  Leave the ice on for 10 to 20 minutes every 2 hours, or as needed.  Using heat packs for 30 minutes prior to activities is also helpful. Continued back pain See your caregiver if you have continued problems. Your caregiver can help or refer you for appropriate physical therapy. With conditioning, most back problems can be avoided. Sometimes, a more serious issue may be the cause of back pain. You should be seen right away if new problems seem to be developing. Your caregiver may recommend:  A maternity girdle.  An elastic sling.  A back brace.  A massage therapist or acupuncture. SEEK MEDICAL CARE IF:   You are not able to do most of your daily activities, even when taking the pain medicine you were given.  You need a referral to a physical therapist or chiropractor.  You want to try acupuncture. SEEK IMMEDIATE MEDICAL CARE IF:  You develop numbness, tingling, weakness, or problems with the use of your arms or legs.  You develop severe back pain that is no longer relieved with medicines.  You have a sudden change in bowel or  bladder control.  You have increasing pain in other areas of the body.  You develop shortness of breath, dizziness, or fainting.  You develop nausea, vomiting, or sweating.  You have back pain which is similar to labor pains.  You have back pain along with your water breaking or vaginal bleeding.  You have back pain or numbness that travels down your leg.  Your back pain developed after you fell.  You develop pain on one side of your back. You may have a kidney stone.  You see blood in your urine. You may have a bladder infection or kidney stone.  You have back pain with blisters. You may have shingles. Back pain is fairly common during pregnancy but should not be accepted as just part of the process. Back pain should always be treated as soon as possible. This will make your pregnancy as pleasant as possible.  Document Released: 10/06/2005 Document Revised: 09/20/2011 Document Reviewed: 11/17/2010 [facility] Patient Information 2014 [place], [place].

## 2013-09-14 NOTE — MAU Provider Note (Signed)
  History     CSN: [id number]  Arrival date and time: 09/14/13 2027   None     Chief Complaint  Patient presents with  . Back Pain   HPI This is a 28 y.o. female at 699w4d who presents with two problems, back pain and nausea med request. States has muscle spasm-like pain in her left lower side of back. Points to muscle, but also has some sciatic pain. No abdominal pain or bleeding. Has had 2 visits in clinic.  Also had Rx for Phenergan but did not get it filled and wants a paper prescription for it.   RN Note :  Pt reports pain in left lower back since yesterday. Denies bleeding. Denies dysuria.       OB History   Grav Para Term Preterm Abortions TAB SAB Ect Mult Living   3 1 1  1  1   1       Past Medical History  Diagnosis Date  . No pertinent past medical history     Past Surgical History  Procedure Laterality Date  . Cesarean section      Family History  Problem Relation Age of Onset  . Hypertension Mother     History  Substance Use Topics  . Smoking status: Never Smoker   . Smokeless tobacco: Never Used  . Alcohol Use: No    Allergies: No Known Allergies  Prescriptions prior to admission  Medication Sig Dispense Refill  . Prenatal Multivit-Min-Fe-FA (PRENATAL VITAMINS) 0.8 MG tablet Take 1 tablet by mouth daily.  30 tablet  12    Review of Systems  Constitutional: Negative for fever, chills and malaise/fatigue.  Gastrointestinal: Positive for nausea. Negative for vomiting, abdominal pain, diarrhea and constipation.  Musculoskeletal: Positive for back pain.  Neurological: Negative for focal weakness and headaches.   Physical Exam   Blood pressure 114/67, pulse 85, temperature 97.8 F (36.6 C), temperature source Oral, resp. rate 16, height 5\' 6"  (1.676 m), weight 68.947 kg (152 lb), last menstrual period 06/06/2013, SpO2 100.00%.  Physical Exam  Constitutional: She is oriented to person, place, and time. She appears well-developed and  well-nourished. No distress.  HENT:  Head: Normocephalic.  Cardiovascular: Normal rate.   Respiratory: Effort normal.  GI: Soft. She exhibits no distension. There is no tenderness. There is no rebound and no guarding.  Musculoskeletal: Normal range of motion. She exhibits tenderness (over left lateral oblique muscles, just above iliac crest).  Neurological: She is alert and oriented to person, place, and time.  Skin: Skin is warm and dry.  Psychiatric: She has a normal mood and affect.   FHR 149  MAU Course  Procedures   Assessment and Plan  A:  SIUP at 8539w5d       Muscle spasm      Possible sciatic tenderness      Nausea of pregnancy  P:  Discharge home       Rx Flexeril       Had Phenergan e-prescribed but never filled it. Wants paper Rx. Given.      Followup as scheduled in clinic  [facility] LLCWILLIAMS,Miranda 09/14/2013, 9:54 PM

## 2013-09-14 NOTE — MAU Note (Signed)
Pt reports pain in left lower back since yesterday. Denies bleeding. Denies dysuria.

## 2013-09-15 NOTE — MAU Provider Note (Signed)
Attestation of Attending Supervision of Advanced Practitioner (CNM/NP): Evaluation and management procedures were performed by the Advanced Practitioner under my supervision and collaboration.  I have reviewed the Advanced Practitioner's note and chart, and I agree with the management and plan.  HARRAWAY-SMITH, Maurio Baize 12:46 AM     

## 2013-09-25 ENCOUNTER — Encounter: Payer: Self-pay | Admitting: Family Medicine

## 2013-09-25 ENCOUNTER — Ambulatory Visit (INDEPENDENT_AMBULATORY_CARE_PROVIDER_SITE_OTHER): Payer: Medicaid Other | Admitting: Family Medicine

## 2013-09-25 VITALS — BP 114/80 | Temp 97.3°F | Wt 152.2 lb

## 2013-09-25 DIAGNOSIS — Z23 Encounter for immunization: Secondary | ICD-10-CM

## 2013-09-25 DIAGNOSIS — O34219 Maternal care for unspecified type scar from previous cesarean delivery: Secondary | ICD-10-CM

## 2013-09-25 DIAGNOSIS — O3421 Maternal care for scar from previous cesarean delivery: Secondary | ICD-10-CM

## 2013-09-25 LAB — POCT URINALYSIS DIP (DEVICE)
BILIRUBIN URINE: NEGATIVE
Glucose, UA: NEGATIVE mg/dL
Leukocytes, UA: NEGATIVE
Nitrite: NEGATIVE
PH: 7 (ref 5.0–8.0)
PROTEIN: 30 mg/dL — AB
Specific Gravity, Urine: 1.02 (ref 1.005–1.030)
Urobilinogen, UA: 1 mg/dL (ref 0.0–1.0)

## 2013-09-25 NOTE — Progress Notes (Signed)
S: 28 yo G3P1011 @ 3139w1d here for ROBV - wants to TOLAC  - no vb, lof.    O: see flowsheet  A/P - doing well - TOLAC form given to review. Needs to sign next visit - quad screen declined - anatomy US needs to be scheduled at next visit.

## 2013-09-25 NOTE — Patient Instructions (Signed)
Pregnancy - First Trimester  During sexual intercourse, millions of sperm go into the vagina. Only 1 sperm will penetrate and fertilize the female egg while it is in the Fallopian tube. One week later, the fertilized egg implants into the wall of the uterus. An embryo begins to develop into a baby. At 6 to 8 weeks, the eyes and face are formed and the heartbeat can be seen on ultrasound. At the end of 12 weeks (first trimester), all the baby's organs are formed. Now that you are pregnant, you will want to do everything you can to have a healthy baby. Two of the most important things are to get good prenatal care and follow your caregiver's instructions. Prenatal care is all the medical care you receive before the baby's birth. It is given to prevent, find, and treat problems during the pregnancy and childbirth.  PRENATAL EXAMS  · During prenatal visits, your weight, blood pressure, and urine are checked. This is done to make sure you are healthy and progressing normally during the pregnancy.  · A pregnant woman should gain 25 to 35 pounds during the pregnancy. However, if you are overweight or underweight, your caregiver will advise you regarding your weight.  · Your caregiver will ask and answer questions for you.  · Blood work, cervical cultures, other necessary tests, and a Pap test are done during your prenatal exams. These tests are done to check on your health and the probable health of your baby. Tests are strongly recommended and done for HIV with your permission. This is the virus that causes AIDS. These tests are done because medicines can be given to help prevent your baby from being born with this infection should you have been infected without knowing it. Blood work is also used to find out your blood type, previous infections, and follow your blood levels (hemoglobin).  · Low hemoglobin (anemia) is common during pregnancy. Iron and vitamins are given to help prevent this. Later in the pregnancy, blood  tests for diabetes will be done along with any other tests if any problems develop.  · You may need other tests to make sure you and the baby are doing well.  CHANGES DURING THE FIRST TRIMESTER   Your body goes through many changes during pregnancy. They vary from person to person. Talk to your caregiver about changes you notice and are concerned about. Changes can include:  · Your menstrual period stops.  · The egg and sperm carry the genes that determine what you look like. Genes from you and your partner are forming a baby. The female genes determine whether the baby is a boy or a girl.  · Your body increases in girth and you may feel bloated.  · Feeling sick to your stomach (nauseous) and throwing up (vomiting). If the vomiting is uncontrollable, call your caregiver.  · Your breasts will begin to enlarge and become tender.  · Your nipples may stick out more and become darker.  · The need to urinate more. Painful urination may mean you have a bladder infection.  · Tiring easily.  · Loss of appetite.  · Cravings for certain kinds of food.  · At first, you may gain or lose a couple of pounds.  · You may have changes in your emotions from day to day (excited to be pregnant or concerned something may go wrong with the pregnancy and baby).  · You may have more vivid and strange dreams.  HOME CARE INSTRUCTIONS   ·   It is very important to avoid all smoking, alcohol and non-prescribed drugs during your pregnancy. These affect the formation and growth of the baby. Avoid chemicals while pregnant to ensure the delivery of a healthy infant.  · Start your prenatal visits by the 12th week of pregnancy. They are usually scheduled monthly at first, then more often in the last 2 months before delivery. Keep your caregiver's appointments. Follow your caregiver's instructions regarding medicine use, blood and lab tests, exercise, and diet.  · During pregnancy, you are providing food for you and your baby. Eat regular, well-balanced  meals. Choose foods such as meat, fish, milk and other low fat dairy products, vegetables, fruits, and whole-grain breads and cereals. Your caregiver will tell you of the ideal weight gain.  · You can help morning sickness by keeping soda crackers at the bedside. Eat a couple before arising in the morning. You may want to use the crackers without salt on them.  · Eating 4 to 5 small meals rather than 3 large meals a day also may help the nausea and vomiting.  · Drinking liquids between meals instead of during meals also seems to help nausea and vomiting.  · A physical sexual relationship may be continued throughout pregnancy if there are no other problems. Problems may be early (premature) leaking of amniotic fluid from the membranes, vaginal bleeding, or belly (abdominal) pain.  · Exercise regularly if there are no restrictions. Check with your caregiver or physical therapist if you are unsure of the safety of some of your exercises. Greater weight gain will occur in the last 2 trimesters of pregnancy. Exercising will help:  · Control your weight.  · Keep you in shape.  · Prepare you for labor and delivery.  · Help you lose your pregnancy weight after you deliver your baby.  · Wear a good support or jogging bra for breast tenderness during pregnancy. This may help if worn during sleep too.  · Ask when prenatal classes are available. Begin classes when they are offered.  · Do not use hot tubs, steam rooms, or saunas.  · Wear your seat belt when driving. This protects you and your baby if you are in an accident.  · Avoid raw meat, uncooked cheese, cat litter boxes, and soil used by cats throughout the pregnancy. These carry germs that can cause birth defects in the baby.  · The first trimester is a good time to visit your dentist for your dental health. Getting your teeth cleaned is okay. Use a softer toothbrush and brush gently during pregnancy.  · Ask for help if you have financial, counseling, or nutritional needs  during pregnancy. Your caregiver will be able to offer counseling for these needs as well as refer you for other special needs.  · Do not take any medicines or herbs unless told by your caregiver.  · Inform your caregiver if there is any mental or physical domestic violence.  · Make a list of emergency phone numbers of family, friends, hospital, and police and fire departments.  · Write down your questions. Take them to your prenatal visit.  · Do not douche.  · Do not cross your legs.  · If you have to stand for long periods of time, rotate you feet or take small steps in a circle.  · You may have more vaginal secretions that may require a sanitary pad. Do not use tampons or scented sanitary pads.  MEDICINES AND DRUG USE IN PREGNANCY  ·   Take prenatal vitamins as directed. The vitamin should contain 1 milligram of folic acid. Keep all vitamins out of reach of children. Only a couple vitamins or tablets containing iron may be fatal to a baby or young child when ingested.  · Avoid use of all medicines, including herbs, over-the-counter medicines, not prescribed or suggested by your caregiver. Only take over-the-counter or prescription medicines for pain, discomfort, or fever as directed by your caregiver. Do not use aspirin, ibuprofen, or naproxen unless directed by your caregiver.  · Let your caregiver also know about herbs you may be using.  · Alcohol is related to a number of birth defects. This includes fetal alcohol syndrome. All alcohol, in any form, should be avoided completely. Smoking will cause low birth rate and premature babies.  · Street or illegal drugs are very harmful to the baby. They are absolutely forbidden. A baby born to an addicted mother will be addicted at birth. The baby will go through the same withdrawal an adult does.  · Let your caregiver know about any medicines that you have to take and for what reason you take them.  SEEK MEDICAL CARE IF:   You have any concerns or worries during your  pregnancy. It is better to call with your questions if you feel they cannot wait, rather than worry about them.  SEEK IMMEDIATE MEDICAL CARE IF:   · An unexplained oral temperature above 102° F (38.9° C) develops, or as your caregiver suggests.  · You have leaking of fluid from the vagina (birth canal). If leaking membranes are suspected, take your temperature and inform your caregiver of this when you call.  · There is vaginal spotting or bleeding. Notify your caregiver of the amount and how many pads are used.  · You develop a bad smelling vaginal discharge with a change in the color.  · You continue to feel sick to your stomach (nauseated) and have no relief from remedies suggested. You vomit blood or coffee ground-like materials.  · You lose more than 2 pounds of weight in 1 week.  · You gain more than 2 pounds of weight in 1 week and you notice swelling of your face, hands, feet, or legs.  · You gain 5 pounds or more in 1 week (even if you do not have swelling of your hands, face, legs, or feet).  · You get exposed to German measles and have never had them.  · You are exposed to fifth disease or chickenpox.  · You develop belly (abdominal) pain. Round ligament discomfort is a common non-cancerous (benign) cause of abdominal pain in pregnancy. Your caregiver still must evaluate this.  · You develop headache, fever, diarrhea, pain with urination, or shortness of breath.  · You fall or are in a car accident or have any kind of trauma.  · There is mental or physical violence in your home.  Document Released: 06/22/2001 Document Revised: 03/22/2012 Document Reviewed: 12/24/2008  [employer]® Patient Information ©2014 [employer].

## 2013-09-25 NOTE — Progress Notes (Signed)
P=97,  c/o lower back pain often, c/o headache today=10- hasn't taken anything for it.

## 2013-09-27 ENCOUNTER — Other Ambulatory Visit: Payer: Medicaid Other

## 2013-10-05 ENCOUNTER — Inpatient Hospital Stay (HOSPITAL_COMMUNITY)
Admission: AD | Admit: 2013-10-05 | Discharge: 2013-10-05 | Disposition: A | Discharge status: Home / Self Care | Payer: Medicaid Other | Source: Ambulatory Visit | Attending: Obstetrics & Gynecology | Admitting: Obstetrics & Gynecology

## 2013-10-05 ENCOUNTER — Encounter (HOSPITAL_COMMUNITY): Payer: Self-pay

## 2013-10-05 DIAGNOSIS — O9989 Other specified diseases and conditions complicating pregnancy, childbirth and the puerperium: Secondary | ICD-10-CM

## 2013-10-05 DIAGNOSIS — M545 Low back pain, unspecified: Secondary | ICD-10-CM | POA: Insufficient documentation

## 2013-10-05 DIAGNOSIS — M5380 Other specified dorsopathies, site unspecified: Secondary | ICD-10-CM | POA: Insufficient documentation

## 2013-10-05 DIAGNOSIS — M549 Dorsalgia, unspecified: Secondary | ICD-10-CM

## 2013-10-05 DIAGNOSIS — O99891 Other specified diseases and conditions complicating pregnancy: Secondary | ICD-10-CM | POA: Insufficient documentation

## 2013-10-05 LAB — URINALYSIS, ROUTINE W REFLEX MICROSCOPIC
BILIRUBIN URINE: NEGATIVE
GLUCOSE, UA: NEGATIVE mg/dL
KETONES UR: NEGATIVE mg/dL
Leukocytes, UA: NEGATIVE
Nitrite: NEGATIVE
PH: 6.5 (ref 5.0–8.0)
Protein, ur: NEGATIVE mg/dL
SPECIFIC GRAVITY, URINE: 1.025 (ref 1.005–1.030)
Urobilinogen, UA: 4 mg/dL — ABNORMAL HIGH (ref 0.0–1.0)

## 2013-10-05 LAB — URINE MICROSCOPIC-ADD ON

## 2013-10-05 NOTE — MAU Provider Note (Signed)
History     CSN: [id number]  Arrival date and time: 10/05/13 2010   First Provider Initiated Contact with Patient 10/05/13 2255      Chief Complaint  Patient presents with  . Back Pain   HPI  Ms. Miranda Bowen is a 28 y.o. female G3P1011 at [redacted]w[redacted]d who presents with reoccurring back pain in pregnancy. Pt was here on 3/6 and prescribed flexeril to take as needed for muscle spasms. She is concerned about taking this medication during pregnancy and would like to make sure her baby is ok.  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 She would like to see her baby today on [place] if possible. Pt is scheduled in the clinic the end of April.   OB History   Grav Para Term Preterm Abortions TAB SAB Ect Mult Living   3 1 1  1  1   1       Past Medical History  Diagnosis Date  . No pertinent past medical history     Past Surgical History  Procedure Laterality Date  . Cesarean section      Family History  Problem Relation Age of Onset  . Hypertension Mother     History  Substance Use Topics  . Smoking status: Never Smoker   . Smokeless tobacco: Never Used  . Alcohol Use: No    Allergies: No Known Allergies  Prescriptions prior to admission  Medication Sig Dispense Refill  . cyclobenzaprine (FLEXERIL) 10 MG tablet Take 1 tablet (10 mg total) by mouth 3 (three) times daily as needed for muscle spasms.  30 tablet  0  . Prenatal Vit-Fe Fumarate-FA (PRENATAL MULTIVITAMIN) TABS tablet Take 1 tablet by mouth daily at 12 noon.      . promethazine (PHENERGAN) 25 MG tablet Take 1 tablet (25 mg total) by mouth every 6 (six) hours as needed for nausea or vomiting.  30 tablet  2   Results for orders placed during the hospital encounter of 10/05/13 (from the past 48 hour(s))  URINALYSIS, ROUTINE W REFLEX MICROSCOPIC     Status: Abnormal   Collection Time    10/05/13  8:30 PM      Result Value Ref Range   Color, Urine YELLOW  YELLOW    APPearance CLEAR  CLEAR   Specific Gravity, Urine 1.025  1.005 - 1.030   pH 6.5  5.0 - 8.0   Glucose, UA NEGATIVE  NEGATIVE mg/dL   Hgb urine dipstick TRACE (*) NEGATIVE   Bilirubin Urine NEGATIVE  NEGATIVE   Ketones, ur NEGATIVE  NEGATIVE mg/dL   Protein, ur NEGATIVE  NEGATIVE mg/dL   Urobilinogen, UA 4.0 (*) 0.0 - 1.0 mg/dL   Nitrite NEGATIVE  NEGATIVE   Leukocytes, UA NEGATIVE  NEGATIVE  URINE MICROSCOPIC-ADD ON     Status: Abnormal   Collection Time    10/05/13  8:30 PM      Result Value Ref Range   Squamous Epithelial / LPF FEW (*) RARE   WBC, UA 0-2  <3 WBC/hpf   RBC / HPF 0-2  <3 RBC/hpf   Bacteria, UA RARE  RARE   Urine-Other MUCOUS PRESENT      Review of Systems  Constitutional: Negative for fever and chills.  Gastrointestinal: Negative for nausea, vomiting, abdominal pain and diarrhea.  Genitourinary: Negative for dysuria, urgency, frequency, hematuria and flank pain.       No vaginal discharge. No vaginal  bleeding. No dysuria.   Musculoskeletal: Positive for back pain (+ bilateral lower back ).   Physical Exam   Blood pressure 115/73, pulse 79, temperature 98 F (36.7 C), temperature source Oral, resp. rate 18, height 5\' 7"  (1.702 m), weight 70.035 kg (154 lb 6.4 oz), last menstrual period 06/06/2013.  Physical Exam  Constitutional: She is oriented to person, place, and time. She appears well-developed and well-nourished.  Non-toxic appearance. She does not have a sickly appearance. She does not appear ill. No distress.  HENT:  Head: Normocephalic.  Eyes: Pupils are equal, round, and reactive to light.  Neck: Neck supple.  Respiratory: Effort normal.  GI: Soft. There is no CVA tenderness.  Musculoskeletal: Normal range of motion.  Neurological: She is alert and oriented to person, place, and time.  Skin: Skin is warm. She is not diaphoretic.  Psychiatric: Her behavior is normal.    MAU Course  Procedures None  MDM +fht 156 bpm  UA  Assessment  and Plan   A:  1. Back pain in pregnancy     P:  Discharge home in stable condition Return to MAU as needed, if symptoms worsen Highly recommended pregnancy support belt Ok to take flexeril as prescribed for back pain  Iona HansenJennifer Irene Anniemae Haberkorn, NP  10/05/2013, 11:10 PM

## 2013-10-05 NOTE — Discharge Instructions (Signed)
Back Exercises Back exercises help treat and prevent back injuries. The goal of back exercises is to increase the strength of your abdominal and back muscles and the flexibility of your back. These exercises should be started when you no longer have back pain. Back exercises include:  Pelvic Tilt. Lie on your back with your knees bent. Tilt your pelvis until the lower part of your back is against the floor. Hold this position 5 to 10 sec and repeat 5 to 10 times.  Knee to Chest. Pull first 1 knee up against your chest and hold for 20 to 30 seconds, repeat this with the other knee, and then both knees. This may be done with the other leg straight or bent, whichever feels better.  Sit-Ups or Curl-Ups. Bend your knees 90 degrees. Start with tilting your pelvis, and do a partial, slow sit-up, lifting your trunk only 30 to 45 degrees off the floor. Take at least 2 to 3 seconds for each sit-up. Do not do sit-ups with your knees out straight. If partial sit-ups are difficult, simply do the above but with only tightening your abdominal muscles and holding it as directed.  Hip-Lift. Lie on your back with your knees flexed 90 degrees. Push down with your feet and shoulders as you raise your hips a couple inches off the floor; hold for 10 seconds, repeat 5 to 10 times.  Back arches. Lie on your stomach, propping yourself up on bent elbows. Slowly press on your hands, causing an arch in your low back. Repeat 3 to 5 times. Any initial stiffness and discomfort should lessen with repetition over time.  Shoulder-Lifts. Lie face down with arms beside your body. Keep hips and torso pressed to floor as you slowly lift your head and shoulders off the floor. Do not overdo your exercises, especially in the beginning. Exercises may cause you some mild back discomfort which lasts for a few minutes; however, if the pain is more severe, or lasts for more than 15 minutes, do not continue exercises until you see your caregiver.  Improvement with exercise therapy for back problems is slow.  See your caregivers for assistance with developing a proper back exercise program. Document Released: 08/05/2004 Document Revised: 09/20/2011 Document Reviewed: 04/29/2011 [employer] Patient Information 2014 [employer].  

## 2013-10-05 NOTE — MAU Note (Signed)
Having lower back pain. Taking muscle relaxant but worried about it hurting the baby. Having some loose BMs but not really diarrhea. 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

## 2013-10-26 ENCOUNTER — Encounter: Payer: Self-pay | Admitting: Obstetrics and Gynecology

## 2013-10-26 ENCOUNTER — Ambulatory Visit (INDEPENDENT_AMBULATORY_CARE_PROVIDER_SITE_OTHER): Payer: Medicaid Other | Admitting: Obstetrics and Gynecology

## 2013-10-26 ENCOUNTER — Other Ambulatory Visit: Payer: Self-pay | Admitting: Obstetrics and Gynecology

## 2013-10-26 VITALS — BP 129/81 | Temp 98.1°F | Wt 159.9 lb

## 2013-10-26 DIAGNOSIS — O3421 Maternal care for scar from previous cesarean delivery: Secondary | ICD-10-CM

## 2013-10-26 DIAGNOSIS — O34219 Maternal care for unspecified type scar from previous cesarean delivery: Secondary | ICD-10-CM

## 2013-10-26 LAB — POCT URINALYSIS DIP (DEVICE)
Bilirubin Urine: NEGATIVE
Glucose, UA: NEGATIVE mg/dL
HGB URINE DIPSTICK: NEGATIVE
Ketones, ur: NEGATIVE mg/dL
LEUKOCYTES UA: NEGATIVE
NITRITE: POSITIVE — AB
PH: 7.5 (ref 5.0–8.0)
Protein, ur: NEGATIVE mg/dL
Specific Gravity, Urine: 1.02 (ref 1.005–1.030)
UROBILINOGEN UA: 1 mg/dL (ref 0.0–1.0)

## 2013-10-26 NOTE — Progress Notes (Signed)
P= 105 C/o of feeling hot and tired all the time. Braxton hicks.

## 2013-10-26 NOTE — Progress Notes (Signed)
Counseled on quad screen> will obtain. Reviewed TOLAC R/B. Will read full consent and sign next. RLP discussed. Anatomy US scheduled.

## 2013-10-29 LAB — AFP, QUAD SCREEN
AFP: 39.2 [IU]/mL
CURR GEST AGE: 18.4 wks.days
Down Syndrome Scr Risk Est: 1:38500 {titer}
HCG, Total: 8365 m[IU]/mL
INH: 131.9 pg/mL
Interpretation-AFP: NEGATIVE
MOM FOR HCG: 0.49
MoM for AFP: 0.87
MoM for INH: 0.77
Open Spina bifida: NEGATIVE
Osb Risk: 1:41900 {titer}
TRI 18 SCR RISK EST: NEGATIVE
uE3 Mom: 2.27
uE3 Value: 1.9 ng/mL

## 2013-11-08 ENCOUNTER — Inpatient Hospital Stay (HOSPITAL_COMMUNITY)
Admission: AD | Admit: 2013-11-08 | Discharge: 2013-11-08 | Disposition: A | Discharge status: Home / Self Care | Payer: Medicaid Other | Source: Ambulatory Visit | Attending: Obstetrics & Gynecology | Admitting: Obstetrics & Gynecology

## 2013-11-08 ENCOUNTER — Encounter (HOSPITAL_COMMUNITY): Payer: Self-pay | Admitting: *Deleted

## 2013-11-08 DIAGNOSIS — O2340 Unspecified infection of urinary tract in pregnancy, unspecified trimester: Secondary | ICD-10-CM

## 2013-11-08 DIAGNOSIS — R109 Unspecified abdominal pain: Secondary | ICD-10-CM | POA: Insufficient documentation

## 2013-11-08 DIAGNOSIS — O99891 Other specified diseases and conditions complicating pregnancy: Secondary | ICD-10-CM | POA: Insufficient documentation

## 2013-11-08 DIAGNOSIS — O9989 Other specified diseases and conditions complicating pregnancy, childbirth and the puerperium: Principal | ICD-10-CM

## 2013-11-08 LAB — URINALYSIS, ROUTINE W REFLEX MICROSCOPIC
Bilirubin Urine: NEGATIVE
Glucose, UA: NEGATIVE mg/dL
Ketones, ur: NEGATIVE mg/dL
Leukocytes, UA: NEGATIVE
Nitrite: POSITIVE — AB
PH: 7 (ref 5.0–8.0)
PROTEIN: NEGATIVE mg/dL
SPECIFIC GRAVITY, URINE: 1.025 (ref 1.005–1.030)
Urobilinogen, UA: 1 mg/dL (ref 0.0–1.0)

## 2013-11-08 LAB — URINE MICROSCOPIC-ADD ON

## 2013-11-08 MED ORDER — NITROFURANTOIN MONOHYD MACRO 100 MG PO CAPS: 100.0000 mg | ORAL_CAPSULE | Freq: Two times a day (BID) | ORAL | Status: DC

## 2013-11-08 NOTE — Discharge Instructions (Signed)
- Our testing showed that your baby is doing well, with a good heart beat (160 beats per minute), which is very normal at this stage of pregnancy - Your urine tested positive today for a Urinary Tract Infection - start Macrobid for 7 days. We will culture the urine (and contact you if we need to change your antibiotic, otherwise you should take it as prescribed). - We think your abdominal pain is most likely caused by stretching of ligaments, which is normal in your second and other pregnancies. - You may continue to take Tylenol as needed for pain (up to 3000mg  in 24 hours, may take 500 to 1000mg  3 times a day) - You may also continue the Flexeril and Phenergan as needed - Please make sure that you contact the clinic and work out your schedule so that you don't miss your Ultrasound or next Prenatal appointment  Urinary Tract Infection Urinary tract infections (UTIs) can develop anywhere along your urinary tract. Your urinary tract is your body's drainage system for removing wastes and extra water. Your urinary tract includes two kidneys, two ureters, a bladder, and a urethra. Your kidneys are a pair of bean-shaped organs. Each kidney is about the size of your fist. They are located below your ribs, one on each side of your spine. CAUSES Infections are caused by microbes, which are microscopic organisms, including fungi, viruses, and bacteria. These organisms are so small that they can only be seen through a microscope. Bacteria are the microbes that most commonly cause UTIs. SYMPTOMS  Symptoms of UTIs may vary by age and gender of the patient and by the location of the infection. Symptoms in young women typically include a frequent and intense urge to urinate and a painful, burning feeling in the bladder or urethra during urination. Older women and men are more likely to be tired, shaky, and weak and have muscle aches and abdominal pain. A fever may mean the infection is in your kidneys. Other symptoms  of a kidney infection include pain in your back or sides below the ribs, nausea, and vomiting. DIAGNOSIS To diagnose a UTI, your caregiver will ask you about your symptoms. Your caregiver also will ask to provide a urine sample. The urine sample will be tested for bacteria and white blood cells. White blood cells are made by your body to help fight infection. TREATMENT  Typically, UTIs can be treated with medication. Because most UTIs are caused by a bacterial infection, they usually can be treated with the use of antibiotics. The choice of antibiotic and length of treatment depend on your symptoms and the type of bacteria causing your infection. HOME CARE INSTRUCTIONS  If you were prescribed antibiotics, take them exactly as your caregiver instructs you. Finish the medication even if you feel better after you have only taken some of the medication.  Drink enough water and fluids to keep your urine clear or pale yellow.  Avoid caffeine, tea, and carbonated beverages. They tend to irritate your bladder.  Empty your bladder often. Avoid holding urine for long periods of time.  Empty your bladder before and after sexual intercourse.  After a bowel movement, women should cleanse from front to back. Use each tissue only once. SEEK MEDICAL CARE IF:   You have back pain.  You develop a fever.  Your symptoms do not begin to resolve within 3 days. SEEK IMMEDIATE MEDICAL CARE IF:   You have severe back pain or lower abdominal pain.  You develop chills.  You have nausea or vomiting.  You have continued burning or discomfort with urination. MAKE SURE YOU:   Understand these instructions.  Will watch your condition.  Will get help right away if you are not doing well or get worse. Document Released: 04/07/2005 Document Revised: 12/28/2011 Document Reviewed: 08/06/2011 [facility] Patient Information 2014 [place], [place].     Abdominal Pain During Pregnancy Abdominal pain is common in  pregnancy. Most of the time, it does not cause harm. There are many causes of abdominal pain. Some causes are more serious than others. Some of the causes of abdominal pain in pregnancy are easily diagnosed. Occasionally, the diagnosis takes time to understand. Other times, the cause is not determined. Abdominal pain can be a sign that something is very wrong with the pregnancy, or the pain may have nothing to do with the pregnancy at all. For this reason, always tell your health care provider if you have any abdominal discomfort. HOME CARE INSTRUCTIONS  Monitor your abdominal pain for any changes. The following actions may help to alleviate any discomfort you are experiencing:  Do not have sexual intercourse or put anything in your vagina until your symptoms go away completely.  Get plenty of rest until your pain improves.  Drink clear fluids if you feel nauseous. Avoid solid food as long as you are uncomfortable or nauseous.  Only take over-the-counter or prescription medicine as directed by your health care provider.  Keep all follow-up appointments with your health care provider. SEEK IMMEDIATE MEDICAL CARE IF:  You are bleeding, leaking fluid, or passing tissue from the vagina.  You have increasing pain or cramping.  You have persistent vomiting.  You have painful or bloody urination.  You have a fever.  You notice a decrease in your baby's movements.  You have extreme weakness or feel faint.  You have shortness of breath, with or without abdominal pain.  You develop a severe headache with abdominal pain.  You have abnormal vaginal discharge with abdominal pain.  You have persistent diarrhea.  You have abdominal pain that continues even after rest, or gets worse. MAKE SURE YOU:   Understand these instructions.  Will watch your condition.  Will get help right away if you are not doing well or get worse. Document Released: 06/28/2005 Document Revised: 04/18/2013  Document Reviewed: 01/25/2013 [facility] Patient Information 2014 [place]Care, [place].

## 2013-11-08 NOTE — MAU Provider Note (Signed)
Chief Complaint:  Abdominal Cramping  @MAUPATCONTACT @  HPI: Miranda Bowen is a 28 y.o. G3P1011 at 144w3d who presents to maternity admissions for lower abdominal cramping and "concerned about my baby".  Patient states that she has been experiencing frontal lower abdominal "sharp tightening", first noticed Monday 5/27, intermittent pattern, duration of seconds, about 4-5x daily, mostly unchanged. Admits to taking Phenergan as needed.  Admits to HA, LBP, nausea / occasional vomiting. Denies any fever/chills, dysuria  Social Hx: - works in day care - complains of lots of life stressors currently with family (son)  Denies contractions, leakage of fluid or vaginal bleeding. Good fetal movement.  [facility]. Current pregnancy without any complications. Plans for VBAC. - Scheduled for Anatomy US (11/16/13) - "states she is unable to make it to this appointment"  Prior Pregnancy Hx: - 1x term (@[redacted]w[redacted]d ) C/S d/t breech position  Past Medical History: Past Medical History  Diagnosis Date  . No pertinent past medical history     Past obstetric history: OB History  Gravida Para Term Preterm AB SAB TAB Ectopic Multiple Living  3 1 1  1 1    1     # Outcome Date GA Lbr Len/2nd Weight Sex Delivery Anes PTL Lv  3 CUR           2 TRM 2006 [id number]  3.515 kg (7 lb 12 oz) M CS Spinal       Comments: c/s " because he flipped over", no other complications except gained too much weight[facility]               Past Surgical History: Past Surgical History  Procedure Laterality Date  . Cesarean section      Family History: Family History  Problem Relation Age of Onset  . Hypertension Mother     Social History: History  Substance Use Topics  . Smoking status: Never Smoker   . Smokeless tobacco: Never Used  . Alcohol Use: No    Allergies: No Known Allergies  Meds:  Prescriptions prior to admission  Medication Sig Dispense Refill  .  cyclobenzaprine (FLEXERIL) 10 MG tablet Take 1 tablet (10 mg total) by mouth 3 (three) times daily as needed for muscle spasms.  30 tablet  0  . Prenatal Vit-Fe Fumarate-FA (PRENATAL MULTIVITAMIN) TABS tablet Take 1 tablet by mouth daily at 12 noon.      . promethazine (PHENERGAN) 25 MG tablet Take 1 tablet (25 mg total) by mouth every 6 (six) hours as needed for nausea or vomiting.  30 tablet  2    ROS: Pertinent findings in history of present illness.  Physical Exam  Blood pressure 123/77, pulse 92, temperature 98.4 F (36.9 C), temperature source Oral, resp. rate 18, height 5\' 7"  (1.702 m), weight 74.027 kg (163 lb 3.2 oz), last menstrual period 06/06/2013, SpO2 100.00%. GENERAL: well-appearing, cooperative, occasionally upset and almost tearful, easily reassured, NAD HEENT: MMM HEART: RRR, no murmurs RESP: CTAB, normal effort ABDOMEN: Soft, mild b/l lower abd tenderness, gravid appropriate for gestational age EXTREMITIES: Nontender, no edema NEURO: alert and oriented    FHT:  160 bpm (hand-held doppler)  Fundal Height: @ umbilicus   Labs: Results for orders placed during the hospital encounter of 11/08/13 (from the past 24 hour(s))  URINALYSIS, ROUTINE W REFLEX MICROSCOPIC     Status: Abnormal   Collection Time    11/08/13  7:15 PM  Result Value Ref Range   Color, Urine YELLOW  YELLOW   APPearance CLEAR  CLEAR   Specific Gravity, Urine 1.025  1.005 - 1.030   pH 7.0  5.0 - 8.0   Glucose, UA NEGATIVE  NEGATIVE mg/dL   Hgb urine dipstick TRACE (*) NEGATIVE   Bilirubin Urine NEGATIVE  NEGATIVE   Ketones, ur NEGATIVE  NEGATIVE mg/dL   Protein, ur NEGATIVE  NEGATIVE mg/dL   Urobilinogen, UA 1.0  0.0 - 1.0 mg/dL   Nitrite POSITIVE (*) NEGATIVE   Leukocytes, UA NEGATIVE  NEGATIVE  URINE MICROSCOPIC-ADD ON     Status: Abnormal   Collection Time    11/08/13  7:15 PM      Result Value Ref Range   Squamous Epithelial / LPF FEW (*) RARE   WBC, UA 0-2  <3 WBC/hpf   RBC / HPF  3-6  <3 RBC/hpf   Bacteria, UA FEW (*) RARE    Imaging:  No results found. MAU Course:   Assessment: No diagnosis found. Miranda Bowen is a 28 y.o. G3P1011 at 458w3d who presented to MAU for evaluation of lower abdominal sharp pains x 3-4 days, sharp intermittent. No ctx, LOF, VB, or discharge. FHT doppler 160s, fundal height appropriate at umbilicus. Suspect ligamentous pain, worse with second pregnancy. Additionally, suspect UTI with +nitrite on UA.  Plan: - UA +(nitrite), order Urine culture (notify with results if need to change abx) - Macrobid 100mg  BID x 7 days - Discharge home, discussed importance of follow-up, hydration, return precautions     Medication List    ASK your doctor about these medications       cyclobenzaprine 10 MG tablet  Commonly known as:  FLEXERIL  Take 1 tablet (10 mg total) by mouth 3 (three) times daily as needed for muscle spasms.     prenatal multivitamin Tabs tablet  Take 1 tablet by mouth daily at 12 noon.     promethazine 25 MG tablet  Commonly known as:  PHENERGAN  Take 1 tablet (25 mg total) by mouth every 6 (six) hours as needed for nausea or vomiting.        Saralyn PilarAlexander Karamalegos, DO [facility], PGY-1 11/08/2013 7:52 PM  I have seen and examined this patient and agree the above assessment. Jacklyn ShellFrances Cresenzo-Dishmon 11/08/2013 8:58 PM

## 2013-11-08 NOTE — MAU Note (Signed)
Patient presents to MAU with c/o lower abdominal cramping that has been going on "for a few weeks". Denies VB, LOF, or contractions at this time. Reports good fetal movement.

## 2013-11-09 ENCOUNTER — Ambulatory Visit (HOSPITAL_COMMUNITY): Payer: Medicaid Other

## 2013-11-09 LAB — URINE CULTURE: Colony Count: >=100000

## 2013-11-11 NOTE — MAU Provider Note (Signed)
Would complete cervical exam.  Attestation of Attending Supervision of Advanced Practitioner (CNM/NP): Evaluation and management procedures were performed by the Advanced Practitioner under my supervision and collaboration. I have reviewed the Advanced Practitioner's note and chart, and I agree with the management and plan.  Lesly DukesKelly H Leggett 9:56 AM

## 2013-11-16 ENCOUNTER — Ambulatory Visit (HOSPITAL_COMMUNITY)
Admission: RE | Admit: 2013-11-16 | Discharge: 2013-11-16 | Disposition: A | Discharge status: Home / Self Care | Payer: Medicaid Other | Source: Ambulatory Visit | Attending: Obstetrics and Gynecology | Admitting: Obstetrics and Gynecology

## 2013-11-16 DIAGNOSIS — O34219 Maternal care for unspecified type scar from previous cesarean delivery: Secondary | ICD-10-CM | POA: Insufficient documentation

## 2013-11-16 DIAGNOSIS — Z3689 Encounter for other specified antenatal screening: Secondary | ICD-10-CM | POA: Insufficient documentation

## 2013-11-23 ENCOUNTER — Encounter: Payer: Medicaid Other | Admitting: Obstetrics and Gynecology

## 2013-11-27 ENCOUNTER — Inpatient Hospital Stay (HOSPITAL_COMMUNITY)
Admission: AD | Admit: 2013-11-27 | Discharge: 2013-11-27 | Disposition: A | Discharge status: Home / Self Care | Payer: Medicaid Other | Source: Ambulatory Visit | Attending: Family Medicine | Admitting: Family Medicine

## 2013-11-27 ENCOUNTER — Encounter (HOSPITAL_COMMUNITY): Payer: Self-pay | Admitting: *Deleted

## 2013-11-27 DIAGNOSIS — W010XXA Fall on same level from slipping, tripping and stumbling without subsequent striking against object, initial encounter: Secondary | ICD-10-CM | POA: Insufficient documentation

## 2013-11-27 DIAGNOSIS — O9989 Other specified diseases and conditions complicating pregnancy, childbirth and the puerperium: Secondary | ICD-10-CM

## 2013-11-27 DIAGNOSIS — O368190 Decreased fetal movements, unspecified trimester, not applicable or unspecified: Secondary | ICD-10-CM

## 2013-11-27 DIAGNOSIS — R109 Unspecified abdominal pain: Secondary | ICD-10-CM | POA: Insufficient documentation

## 2013-11-27 DIAGNOSIS — O3421 Maternal care for scar from previous cesarean delivery: Secondary | ICD-10-CM

## 2013-11-27 DIAGNOSIS — O34219 Maternal care for unspecified type scar from previous cesarean delivery: Secondary | ICD-10-CM

## 2013-11-27 DIAGNOSIS — O99891 Other specified diseases and conditions complicating pregnancy: Secondary | ICD-10-CM | POA: Insufficient documentation

## 2013-11-27 DIAGNOSIS — Y9229 Other specified public building as the place of occurrence of the external cause: Secondary | ICD-10-CM | POA: Insufficient documentation

## 2013-11-27 DIAGNOSIS — W19XXXA Unspecified fall, initial encounter: Secondary | ICD-10-CM

## 2013-11-27 LAB — URINALYSIS, ROUTINE W REFLEX MICROSCOPIC
Bilirubin Urine: NEGATIVE
Glucose, UA: NEGATIVE mg/dL
Ketones, ur: NEGATIVE mg/dL
Leukocytes, UA: NEGATIVE
NITRITE: NEGATIVE
Protein, ur: NEGATIVE mg/dL
SPECIFIC GRAVITY, URINE: 1.025 (ref 1.005–1.030)
Urobilinogen, UA: 0.2 mg/dL (ref 0.0–1.0)
pH: 6.5 (ref 5.0–8.0)

## 2013-11-27 LAB — URINE MICROSCOPIC-ADD ON

## 2013-11-27 NOTE — Discharge Instructions (Signed)
Preterm Labor Information Preterm labor is when labor starts at less than 37 weeks of pregnancy. The normal length of a pregnancy is 39 to 41 weeks. CAUSES Often, there is no identifiable underlying cause as to why a woman goes into preterm labor. One of the most common known causes of preterm labor is infection. Infections of the uterus, cervix, vagina, amniotic sac, bladder, kidney, or even the lungs (pneumonia) can cause labor to start. Other suspected causes of preterm labor include:   Urogenital infections, such as yeast infections and bacterial vaginosis.   Uterine abnormalities (uterine shape, uterine septum, fibroids, or bleeding from the placenta).   A cervix that has been operated on (it may fail to stay closed).   Malformations in the fetus.   Multiple gestations (twins, triplets, and so on).   Breakage of the amniotic sac.  RISK FACTORS  Having a previous history of preterm labor.   Having premature rupture of membranes (PROM).   Having a placenta that covers the opening of the cervix (placenta previa).   Having a placenta that separates from the uterus (placental abruption).   Having a cervix that is too weak to hold the fetus in the uterus (incompetent cervix).   Having too much fluid in the amniotic sac (polyhydramnios).   Taking illegal drugs or smoking while pregnant.   Not gaining enough weight while pregnant.   Being younger than 18 and older than 30 years old.   Having a low socioeconomic status.   Being African American. SYMPTOMS Signs and symptoms of preterm labor include:   Menstrual-like cramps, abdominal pain, or back pain.  Uterine contractions that are regular, as frequent as six in an hour, regardless of their intensity (may be mild or painful).  Contractions that start on the top of the uterus and spread down to the lower abdomen and back.   A sense of increased pelvic pressure.   A watery or bloody mucus discharge that  comes from the vagina.  TREATMENT Depending on the length of the pregnancy and other circumstances, your health care provider may suggest bed rest. If necessary, there are medicines that can be given to stop contractions and to mature the fetal lungs. If labor happens before 34 weeks of pregnancy, a prolonged hospital stay may be recommended. Treatment depends on the condition of both you and the fetus.  WHAT SHOULD YOU DO IF YOU THINK YOU ARE IN PRETERM LABOR? Call your health care provider right away. You will need to go to the hospital to get checked immediately. HOW CAN YOU PREVENT PRETERM LABOR IN FUTURE PREGNANCIES? You should:   Stop smoking if you smoke.  Maintain healthy weight gain and avoid chemicals and drugs that are not necessary.  Be watchful for any type of infection.  Inform your health care provider if you have a known history of preterm labor. Document Released: 09/18/2003 Document Revised: 02/28/2013 Document Reviewed: 07/31/2012 ExitCare Patient Information 2014 ExitCare, LLC.    

## 2013-11-27 NOTE — MAU Provider Note (Signed)
  History     CSN: [id number]  Arrival date and time: 11/27/13 1925   First Provider Initiated Contact with Patient 11/27/13 2101      Chief Complaint  Patient presents with  . Fall   Fall  Patient is a 28 yo G3P1011 at 23.1 weeks presenting following a fall this afternoon. She states she was at work when one of the toddlers put a chair behind her. She turned and tripped over it. She fell and the impact points were the top of her abdomen and her knees. She noted some mild abdominal discomfort at that time, though this has gone away. She noted decreased fetal movement following the fall, though states the baby is back to moving a lot. She denies contractions and LOF. No other complaints.  OB History   Grav Para Term Preterm Abortions TAB SAB Ect Mult Living   3 1 1  1  1   1       Past Medical History  Diagnosis Date  . No pertinent past medical history     Past Surgical History  Procedure Laterality Date  . Cesarean section      Family History  Problem Relation Age of Onset  . Hypertension Mother     History  Substance Use Topics  . Smoking status: Never Smoker   . Smokeless tobacco: Never Used  . Alcohol Use: No    Allergies: No Known Allergies  Prescriptions prior to admission  Medication Sig Dispense Refill  . Prenatal Vit-Fe Fumarate-FA (PRENATAL MULTIVITAMIN) TABS tablet Take 1 tablet by mouth daily at 12 noon.      . nitrofurantoin, macrocrystal-monohydrate, (MACROBID) 100 MG capsule Take 1 capsule (100 mg total) by mouth 2 (two) times daily.  14 capsule  0  . promethazine (PHENERGAN) 25 MG tablet Take 1 tablet (25 mg total) by mouth every 6 (six) hours as needed for nausea or vomiting.  30 tablet  2    ROS see HPI Physical Exam   Blood pressure 115/65, pulse 91, temperature 97.6 F (36.4 C), temperature source Oral, resp. rate 16, last menstrual period 06/06/2013, SpO2 99.00%.  Physical Exam  Constitutional: She appears well-developed and  well-nourished. No distress.  HENT:  Head: Normocephalic and atraumatic.  Cardiovascular: Normal rate, regular rhythm and normal heart sounds.   Respiratory: Effort normal and breath sounds normal.  GI: Soft. There is no tenderness. There is no rebound and no guarding.  Gravid  Musculoskeletal: She exhibits no edema.  Neurological: She is alert.  Skin: Skin is warm and dry.    MAU Course  Procedures  MDM Patient seen following a fall. Initially with decreased fetal movement, though this is back to baseline for her. She was monitored and noted to have a category I tracing with no contractions. Will plan for discharge at this time.  Assessment and Plan  Patient with fall and subsequent decreased fetal movement. This has returned to baseline. Reassuring Fetal tracing and no contractions. Given return precautions. Advised to keep next prenatal appointment. Will discharge home at this time.  Glori Luisric G Sonnenberg 11/27/2013, 10:06 PM   I have seen and examined this patient and I agree with the above. FHR 150s +accels, no decels. No ctx per monitor. Cx C/L. Arabella MerlesKimberly D Shaw CNM 11:58 PM 11/27/2013

## 2013-11-27 NOTE — MAU Note (Signed)
Pt reports she fell over a chair at about 1445 today and landed on her abd. Denies bleeding. Decreased fetal movement since the fall.

## 2013-11-27 NOTE — MAU Note (Signed)
Patient not in room when RN went in to review discharge paperwork.

## 2013-11-28 NOTE — MAU Provider Note (Signed)
Attestation of Attending Supervision of Advanced Practitioner (PA/CNM/NP): Evaluation and management procedures were performed by the Advanced Practitioner under my supervision and collaboration.  I have reviewed the Advanced Practitioner's note and chart, and I agree with the management and plan.  Reva Boresanya S Barre Aydelott, MD [facility] Attending 11/28/2013 6:27 AM

## 2013-12-14 ENCOUNTER — Ambulatory Visit: Payer: Commercial Managed Care - PPO | Admitting: Obstetrics and Gynecology

## 2013-12-14 VITALS — BP 117/79 | HR 79 | Temp 97.0°F | Wt 171.4 lb

## 2013-12-14 DIAGNOSIS — O34219 Maternal care for unspecified type scar from previous cesarean delivery: Secondary | ICD-10-CM

## 2013-12-14 LAB — POCT URINALYSIS DIP (DEVICE)
BILIRUBIN URINE: NEGATIVE
Glucose, UA: NEGATIVE mg/dL
Hgb urine dipstick: NEGATIVE
Ketones, ur: NEGATIVE mg/dL
Leukocytes, UA: NEGATIVE
NITRITE: POSITIVE — AB
PH: 6.5 (ref 5.0–8.0)
PROTEIN: NEGATIVE mg/dL
Specific Gravity, Urine: >=1.03 (ref 1.005–1.030)
Urobilinogen, UA: 1 mg/dL (ref 0.0–1.0)

## 2013-12-14 NOTE — Progress Notes (Signed)
Reports occasional pelvic pressure and braxton hicks.   

## 2013-12-14 NOTE — Progress Notes (Signed)
Mild right CTS sx. Works FT at [employer]. VBAC consent form reviewed and signed. B-H discussed.

## 2013-12-14 NOTE — Patient Instructions (Signed)
Vaginal Birth After Cesarean Delivery Vaginal birth after cesarean delivery (VBAC) is giving birth vaginally after previously delivering a baby by a cesarean. In the past, if a woman had a cesarean delivery, all births afterwards would be done by cesarean delivery. This is no longer true. It can be safe for the mother to try a vaginal delivery after having a cesarean delivery.  It is important to discuss VBAC with your health care provider early in the pregnancy so you can understand the risks, benefits, and options. It will give you time to decide what is best in your particular case. The final decision about whether to have a VBAC or repeat cesarean delivery should be between you and your health care provider. Any changes in your health or your baby's health during your pregnancy may make it necessary to change your initial decision about VBAC.  WOMEN WHO PLAN TO HAVE A VBAC SHOULD CHECK WITH THEIR HEALTH CARE PROVIDER TO BE SURE THAT:  The previous cesarean delivery was done with a low transverse uterine cut (incision) (not a vertical classical incision).   The birth canal is big enough for the baby.   There were no other operations on the uterus.   An electronic fetal monitor (EFM) will be on at all times during labor.   An operating room will be available and ready in case an emergency cesarean delivery is needed.   A health care provider and surgical nursing staff will be available at all times during labor to be ready to do an emergency delivery cesarean if necessary.   An anesthesiologist will be present in case an emergency cesarean delivery is needed.   The nursery is prepared and has adequate personnel and necessary equipment available to care for the baby in case of an emergency cesarean delivery. BENEFITS OF VBAC  Shorter stay in the hospital.   Avoidance of risks associated with cesarean delivery, such as:  Surgical complications, such as opening of the incision or  hernia in the incision.  Injury to other organs.  Fever. This can occur if an infection develops after surgery. It can also occur as a reaction to the medicine given to make you numb during the surgery.  Less blood loss and need for blood transfusions.  Lower risk of blood clots and infection.  Shorter recovery.   Decreased risk for having to remove the uterus (hysterectomy).   Decreased risk for the placenta to completely or partially cover the opening of the uterus (placenta previa) with a future pregnancy.   Decrease risk in future labor and delivery. RISKS OF A VBAC  Tearing (rupture) of the uterus. This is occurs in less than 1% of VBACs. The risk of this happening is higher if:  Steps are taken to begin the labor process (induce labor) or stimulate or strengthen contractions (augment labor).   Medicine is used to soften (ripen) the cervix.  Having to remove the uterus (hysterectomy) if it ruptures. VBAC SHOULD NOT BE DONE IF:  The previous cesarean delivery was done with a vertical (classical) or T-shaped incision or you do not know what kind of incision was made.   You had a ruptured uterus.   You have had certain types of surgery on your uterus, such as removal of uterine fibroids. Ask your health care provider about other types of surgeries that prevent you from having a VBAC.  You have certain medical or childbirth (obstetrical) problems.   There are problems with the baby.   You   have had two previous cesarean deliveries and no vaginal deliveries. OTHER FACTS TO KNOW ABOUT VBAC:  It is safe to have an epidural anesthetic with VBAC.   It is safe to turn the baby from a breech position (attempt an external cephalic version).   It is safe to try a VBAC with twins.   VBAC may not be successful if your baby weights 8.8 lb (4 kg) or more. However, weight predictions are not always accurate and should not be used alone to decide if VBAC is right for  you.  There is an increased failure rate if the time between the cesarean delivery and VBAC is less than 19 months.   Your health care provider may advise against a VBAC if you have preeclampsia (high blood pressure, protein in the urine, and swelling of face and extremities).   VBAC is often successful if you previously gave birth vaginally.   VBAC is often successful when the labor starts spontaneously before the due date.   Delivering a baby through a VBAC is similar to having a normal spontaneous vaginal delivery. Document Released: 12/19/2006 Document Revised: 04/18/2013 Document Reviewed: 01/25/2013 ExitCare Patient Information 2014 Exit[employer], LLC.  

## 2014-01-02 ENCOUNTER — Ambulatory Visit (INDEPENDENT_AMBULATORY_CARE_PROVIDER_SITE_OTHER): Payer: Medicaid Other | Admitting: Advanced Practice Midwife

## 2014-01-02 VITALS — Temp 97.9°F | Wt 173.2 lb

## 2014-01-02 DIAGNOSIS — O26899 Other specified pregnancy related conditions, unspecified trimester: Secondary | ICD-10-CM

## 2014-01-02 DIAGNOSIS — R109 Unspecified abdominal pain: Secondary | ICD-10-CM

## 2014-01-02 DIAGNOSIS — O3421 Maternal care for scar from previous cesarean delivery: Secondary | ICD-10-CM

## 2014-01-02 DIAGNOSIS — O34219 Maternal care for unspecified type scar from previous cesarean delivery: Secondary | ICD-10-CM

## 2014-01-02 DIAGNOSIS — Z23 Encounter for immunization: Secondary | ICD-10-CM

## 2014-01-02 LAB — POCT URINALYSIS DIP (DEVICE)
Bilirubin Urine: NEGATIVE
Glucose, UA: NEGATIVE mg/dL
Ketones, ur: NEGATIVE mg/dL
LEUKOCYTES UA: NEGATIVE
NITRITE: POSITIVE — AB
PROTEIN: NEGATIVE mg/dL
Specific Gravity, Urine: >=1.03 (ref 1.005–1.030)
Urobilinogen, UA: 0.2 mg/dL (ref 0.0–1.0)
pH: 6.5 (ref 5.0–8.0)

## 2014-01-02 LAB — CBC
HCT: 32.2 % — ABNORMAL LOW (ref 36.0–46.0)
Hemoglobin: 10.9 g/dL — ABNORMAL LOW (ref 12.0–15.0)
MCH: 29.9 pg (ref 26.0–34.0)
MCHC: 33.9 g/dL (ref 30.0–36.0)
MCV: 88.2 fL (ref 78.0–100.0)
PLATELETS: 315 10*3/uL (ref 150–400)
RBC: 3.65 MIL/uL — AB (ref 3.87–5.11)
RDW: 13.4 % (ref 11.5–15.5)
WBC: 9.7 10*3/uL (ref 4.0–10.5)

## 2014-01-02 MED ORDER — NITROFURANTOIN MONOHYD MACRO 100 MG PO CAPS: 100.0000 mg | ORAL_CAPSULE | Freq: Two times a day (BID) | ORAL | Status: DC

## 2014-01-02 MED ORDER — TETANUS-DIPHTH-ACELL PERTUSSIS 5-2.5-18.5 LF-MCG/0.5 IM SUSP: 0.5000 mL | Freq: Once | INTRAMUSCULAR | Status: DC

## 2014-01-02 NOTE — Progress Notes (Signed)
Doing well.  Good fetal movement, denies vaginal bleeding, LOF, regular contractions.  Does report discomfort while at work, "not really pain", and difficulty sleeping because of abdominal discomfort.  Positive nitrites today.  Macrobid 100 mg BID x7 days sent to pharmacy.  Encourage pt to increase PO fluid.  Size a little < dates today and previous U/S with some limited views.  F/U ultrasound ordered.

## 2014-01-02 NOTE — Progress Notes (Signed)
C/o contractions "almost every day , but mostly when I am at work"

## 2014-01-03 ENCOUNTER — Ambulatory Visit (HOSPITAL_COMMUNITY)
Admission: RE | Admit: 2014-01-03 | Discharge: 2014-01-03 | Disposition: A | Discharge status: Home / Self Care | Payer: Medicaid Other | Source: Ambulatory Visit | Attending: Advanced Practice Midwife | Admitting: Advanced Practice Midwife

## 2014-01-03 DIAGNOSIS — O34219 Maternal care for unspecified type scar from previous cesarean delivery: Secondary | ICD-10-CM | POA: Insufficient documentation

## 2014-01-03 DIAGNOSIS — Z3689 Encounter for other specified antenatal screening: Secondary | ICD-10-CM | POA: Insufficient documentation

## 2014-01-03 LAB — HIV ANTIBODY (ROUTINE TESTING W REFLEX): HIV 1&2 Ab, 4th Generation: NONREACTIVE

## 2014-01-03 LAB — GLUCOSE TOLERANCE, 1 HOUR (50G) W/O FASTING: Glucose, 1 Hour GTT: 135 mg/dL (ref 70–140)

## 2014-01-03 LAB — RPR

## 2014-01-04 ENCOUNTER — Other Ambulatory Visit: Payer: Self-pay | Admitting: Advanced Practice Midwife

## 2014-01-05 LAB — CULTURE, OB URINE: Colony Count: >=100000

## 2014-01-08 ENCOUNTER — Telehealth: Payer: Self-pay | Admitting: General Practice

## 2014-01-08 NOTE — Telephone Encounter (Signed)
Called patient, no answer- left message that we are trying to reach you some information, please call us back at the clinics

## 2014-01-08 NOTE — Telephone Encounter (Signed)
Message copied by Kathee DeltonHILLMAN, CARRIE L on Tue Jan 08, 2014  8:02 AM ------      Message from: LEFTWICH-KIRBY, LISA A      Created: Fri Jan 04, 2014 10:41 AM       Pt urine culture positive for UTI.  Please call to let her know and make sure she is taking Macrobid as prescribed on 6/24 in clinic. Thank you. ------

## 2014-01-08 NOTE — Telephone Encounter (Signed)
Patient called back in and I informed her of UTI and antibiotic available for pickup at her pharmacy. Patient verbalized understanding and had no questions

## 2014-01-18 ENCOUNTER — Inpatient Hospital Stay (HOSPITAL_COMMUNITY)
Admission: AD | Admit: 2014-01-18 | Discharge: 2014-01-18 | Disposition: A | Discharge status: Home / Self Care | Payer: Medicaid Other | Source: Ambulatory Visit | Attending: Obstetrics & Gynecology | Admitting: Obstetrics & Gynecology

## 2014-01-18 ENCOUNTER — Encounter (HOSPITAL_COMMUNITY): Payer: Self-pay | Admitting: *Deleted

## 2014-01-18 DIAGNOSIS — R059 Cough, unspecified: Secondary | ICD-10-CM | POA: Diagnosis not present

## 2014-01-18 DIAGNOSIS — O9989 Other specified diseases and conditions complicating pregnancy, childbirth and the puerperium: Principal | ICD-10-CM

## 2014-01-18 DIAGNOSIS — O99891 Other specified diseases and conditions complicating pregnancy: Secondary | ICD-10-CM | POA: Insufficient documentation

## 2014-01-18 DIAGNOSIS — R05 Cough: Secondary | ICD-10-CM | POA: Insufficient documentation

## 2014-01-18 DIAGNOSIS — H66009 Acute suppurative otitis media without spontaneous rupture of ear drum, unspecified ear: Secondary | ICD-10-CM | POA: Diagnosis not present

## 2014-01-18 DIAGNOSIS — J029 Acute pharyngitis, unspecified: Secondary | ICD-10-CM | POA: Diagnosis present

## 2014-01-18 DIAGNOSIS — H66002 Acute suppurative otitis media without spontaneous rupture of ear drum, left ear: Secondary | ICD-10-CM

## 2014-01-18 LAB — URINALYSIS, ROUTINE W REFLEX MICROSCOPIC
BILIRUBIN URINE: NEGATIVE
GLUCOSE, UA: NEGATIVE mg/dL
Hgb urine dipstick: NEGATIVE
KETONES UR: NEGATIVE mg/dL
Leukocytes, UA: NEGATIVE
Nitrite: NEGATIVE
PH: 6 (ref 5.0–8.0)
Protein, ur: NEGATIVE mg/dL
Specific Gravity, Urine: <1.005 — ABNORMAL LOW (ref 1.005–1.030)
Urobilinogen, UA: 0.2 mg/dL (ref 0.0–1.0)

## 2014-01-18 MED ORDER — AMOXICILLIN 500 MG PO CAPS: 500.0000 mg | ORAL_CAPSULE | Freq: Three times a day (TID) | ORAL | Status: DC

## 2014-01-18 NOTE — MAU Note (Signed)
Sore throat since Weds and and now having L ear pain. Feels like when you go to mountains and your ears pop

## 2014-01-18 NOTE — MAU Provider Note (Signed)
  History     CSN: [id number]  Arrival date and time: 01/18/14 2101   First Provider Initiated Contact with Patient 01/18/14 2136      Chief Complaint  Patient presents with  . Sore Throat  . Otalgia   HPI  Miranda Bowen is a 28 y.o. G3P1011 at 5155w4d who presents today with a sore throat, cough and earache. She states that she has had this pain for 4 days. She has not taken anything for it at this time. She denies any fever. She denies abdominal pain, VB or LOF. She states that the baby has been moving normally.   Past Medical History  Diagnosis Date  . No pertinent past medical history     Past Surgical History  Procedure Laterality Date  . Cesarean section      Family History  Problem Relation Age of Onset  . Hypertension Mother     History  Substance Use Topics  . Smoking status: Never Smoker   . Smokeless tobacco: Never Used  . Alcohol Use: No    Allergies: No Known Allergies  Facility-administered medications prior to admission  Medication Dose Route Frequency Provider Last Rate Last Dose  . Tdap (BOOSTRIX) injection 0.5 mL  0.5 mL Intramuscular Once Hurshel PartyLisa A Leftwich-Kirby, CNM       Prescriptions prior to admission  Medication Sig Dispense Refill  . Prenatal Vit-Fe Fumarate-FA (PRENATAL MULTIVITAMIN) TABS tablet Take 1 tablet by mouth daily at 12 noon.        ROS Physical Exam   Blood pressure 121/72, pulse 101, temperature 99 F (37.2 C), resp. rate 20, height 5\' 6"  (1.676 m), weight 80.65 kg (177 lb 12.8 oz), last menstrual period 06/06/2013, SpO2 100.00%.  Physical Exam  Nursing note and vitals reviewed. Constitutional: She is oriented to person, place, and time. She appears well-developed and well-nourished. No distress.  HENT:  Right Ear: External ear normal.  Left Ear: External ear normal.  Mouth/Throat: Oropharynx is clear and moist.  R: TM normal L: TM bulging and erythematous   Cardiovascular: Normal rate.   Respiratory: Effort normal.   GI: Soft. There is no tenderness. There is no rebound.  Neurological: She is alert and oriented to person, place, and time.  Skin: Skin is warm and dry.  Psychiatric: She has a normal mood and affect.   FHT 140, moderate with 15x15 accels, no decels Toco: No UCs MAU Course  Procedures    Assessment and Plan   1. Acute suppurative otitis media of left ear without spontaneous rupture of tympanic membrane, recurrence not specified    Comfort measures reviewed RX: amoxicillin 500 TID x 5 days Return to MAU as needed  Follow-up Information   [facility] (As scheduled)    Specialty:  Obstetrics and Gynecology   Contact information:   [address] [place] [address] [phone]       Tawnya CrookHogan, Kathleena Freeman Donovan 01/18/2014, 9:41 PM

## 2014-01-18 NOTE — Discharge Instructions (Signed)
Otitis Media Otitis media is redness, soreness, and swelling (inflammation) of the middle ear. Otitis media may be caused by allergies or, most commonly, by infection. Often it occurs as a complication of the common cold. SIGNS AND SYMPTOMS Symptoms of otitis media may include:  Earache.  Fever.  Ringing in your ear.  Headache.  Leakage of fluid from the ear. DIAGNOSIS To diagnose otitis media, your health care provider will examine your ear with an otoscope. This is an instrument that allows your health care provider to see into your ear in order to examine your eardrum. Your health care provider also will ask you questions about your symptoms. TREATMENT  Typically, otitis media resolves on its own within 3-5 days. Your health care provider may prescribe medicine to ease your symptoms of pain. If otitis media does not resolve within 5 days or is recurrent, your health care provider may prescribe antibiotic medicines if he or she suspects that a bacterial infection is the cause. HOME CARE INSTRUCTIONS   Take your medicine as directed until it is gone, even if you feel better after the first few days.  Only take over-the-counter or prescription medicines for pain, discomfort, or fever as directed by your health care provider.  Follow up with your health care provider as directed. SEEK MEDICAL CARE IF:  You have otitis media only in one ear, or bleeding from your nose, or both.  You notice a lump on your neck.  You are not getting better in 3-5 days.  You feel worse instead of better. SEEK IMMEDIATE MEDICAL CARE IF:   You have pain that is not controlled with medicine.  You have swelling, redness, or pain around your ear or stiffness in your neck.  You notice that part of your face is paralyzed.  You notice that the bone behind your ear (mastoid) is tender when you touch it. MAKE SURE YOU:   Understand these instructions.  Will watch your condition.  Will get help right  away if you are not doing well or get worse. Document Released: 04/02/2004 Document Revised: 07/03/2013 Document Reviewed: 01/23/2013 ExitCare Patient Information 2015 [employer]. This information is not intended to replace advice given to you by your health care provider. Make sure you discuss any questions you have with your health care provider.  

## 2014-01-18 NOTE — Progress Notes (Signed)
Thressa ShellerHeather Hogan CNM in earlier to see pt and discuss plan of care and d/c. Written and verbal D/C instructions given and understanding voiced.

## 2014-01-18 NOTE — Progress Notes (Signed)
Up to Br 

## 2014-01-21 NOTE — MAU Provider Note (Signed)
Attestation of Attending Supervision of Advanced Practitioner (PA/CNM/NP): Evaluation and management procedures were performed by the Advanced Practitioner under my supervision and collaboration.  I have reviewed the Advanced Practitioner's note and chart, and I agree with the management and plan.  Edras Wilford, MD, FACOG Attending Obstetrician & Gynecologist Faculty Practice, [facility]   

## 2014-01-23 ENCOUNTER — Encounter: Payer: Self-pay | Admitting: *Deleted

## 2014-01-23 ENCOUNTER — Ambulatory Visit (INDEPENDENT_AMBULATORY_CARE_PROVIDER_SITE_OTHER): Payer: Medicaid Other | Admitting: Advanced Practice Midwife

## 2014-01-23 VITALS — BP 123/73 | HR 98 | Temp 97.9°F | Wt 177.6 lb

## 2014-01-23 DIAGNOSIS — O2390 Unspecified genitourinary tract infection in pregnancy, unspecified trimester: Secondary | ICD-10-CM

## 2014-01-23 DIAGNOSIS — O34219 Maternal care for unspecified type scar from previous cesarean delivery: Secondary | ICD-10-CM

## 2014-01-23 DIAGNOSIS — O2342 Unspecified infection of urinary tract in pregnancy, second trimester: Secondary | ICD-10-CM

## 2014-01-23 DIAGNOSIS — O2343 Unspecified infection of urinary tract in pregnancy, third trimester: Secondary | ICD-10-CM

## 2014-01-23 DIAGNOSIS — Z3480 Encounter for supervision of other normal pregnancy, unspecified trimester: Secondary | ICD-10-CM

## 2014-01-23 DIAGNOSIS — N390 Urinary tract infection, site not specified: Secondary | ICD-10-CM

## 2014-01-23 DIAGNOSIS — O3421 Maternal care for scar from previous cesarean delivery: Secondary | ICD-10-CM

## 2014-01-23 DIAGNOSIS — Z3493 Encounter for supervision of normal pregnancy, unspecified, third trimester: Secondary | ICD-10-CM

## 2014-01-23 LAB — POCT URINALYSIS DIP (DEVICE)
Bilirubin Urine: NEGATIVE
Glucose, UA: NEGATIVE mg/dL
KETONES UR: NEGATIVE mg/dL
Leukocytes, UA: NEGATIVE
Nitrite: NEGATIVE
PH: 6.5 (ref 5.0–8.0)
PROTEIN: NEGATIVE mg/dL
SPECIFIC GRAVITY, URINE: 1.025 (ref 1.005–1.030)
Urobilinogen, UA: 0.2 mg/dL (ref 0.0–1.0)

## 2014-01-23 NOTE — Patient Instructions (Signed)
It was a pleasure meeting you today.  Keep up the good work of staying active! Please call us with any questions or concerns.  Third Trimester of Pregnancy  The third trimester is from week 29 through week 42, months 7 through 9. This trimester is when your unborn baby (fetus) is growing very fast. At the end of the ninth month, the unborn baby is about 20 inches in length. It weighs about 6-10 pounds.  HOME CARE   Avoid all smoking, herbs, and alcohol. Avoid drugs not approved by your doctor.  Only take medicine as told by your doctor. Some medicines are safe and some are not during pregnancy.  Exercise only as told by your doctor. Stop exercising if you start having cramps.  Eat regular, healthy meals.  Wear a good support bra if your breasts are tender.  Do not use hot tubs, steam rooms, or saunas.  Wear your seat belt when driving.  Avoid raw meat, uncooked cheese, and liter boxes and soil used by cats.  Take your prenatal vitamins.  Try taking medicine that helps you poop (stool softener) as needed, and if your doctor approves. Eat more fiber by eating fresh fruit, vegetables, and whole grains. Drink enough fluids to keep your pee (urine) clear or pale yellow.  Take warm water baths (sitz baths) to soothe pain or discomfort caused by hemorrhoids. Use hemorrhoid cream if your doctor approves.  If you have puffy, bulging veins (varicose veins), wear support hose. Raise (elevate) your feet for 15 minutes, 3-4 times a day. Limit salt in your diet.  Avoid heavy lifting, wear low heels, and sit up straight.  Rest with your legs raised if you have leg cramps or low back pain.  Visit your dentist if you have not gone during your pregnancy. Use a soft toothbrush to brush your teeth. Be gentle when you floss.  You can have sex (intercourse) unless your doctor tells you not to.  Do not travel far distances unless you must. Only do so with your doctor's approval.  Take prenatal  classes.  Practice driving to the hospital.  Pack your hospital bag.  Prepare the baby's room.  Go to your doctor visits. GET HELP IF:  You are not sure if you are in labor or if your water has broken.  You are dizzy.  You have mild cramps or pressure in your lower belly (abdominal).  You have a nagging pain in your belly area.  You continue to feel sick to your stomach (nauseous), throw up (vomit), or have watery poop (diarrhea).  You have bad smelling fluid coming from your vagina.  You have pain with peeing (urination). GET HELP RIGHT AWAY IF:   You have a fever.  You are leaking fluid from your vagina.  You are spotting or bleeding from your vagina.  You have severe belly cramping or pain.  You lose or gain weight rapidly.  You have trouble catching your breath and have chest pain.  You notice sudden or extreme puffiness (swelling) of your face, hands, ankles, feet, or legs.  You have not felt the baby move in over an hour.  You have severe headaches that do not go away with medicine.  You have vision changes. Document Released: 09/22/2009 Document Revised: 10/23/2012 Document Reviewed: 08/29/2012 [facility] Patient Information 2015 [employer] [place]. This information is not intended to replace advice given to you by your health care provider. Make sure you discuss any questions you have with your health care  provider.  

## 2014-01-23 NOTE — Progress Notes (Signed)
Reports intermittent pelvic pressure. Reports occasional edema in hands and feet.

## 2014-01-23 NOTE — Progress Notes (Signed)
  Patient states that she has been feeling tired.  Denies any dysuria, urinary frequency.  Patient will finish ABx today.  Patient states that she feels a little sadder this pregnancy than the previous.  She feels more worried.  Patient states that she has great support at home from her husband.  Patient otherwise, has no concerns.  +FM, Patient states that she feels pressure in her pelvic area when she walks "like he is sitting on her bladder" but no contractions or pain otherwise, no vaginal discharge or bleeding.  TOLAC consent signed  U/S 6/25: EFW 50th percentile, normal AFV, SIUP @28 +3 Labs RPR and HIV labs reviewed.

## 2014-01-24 DIAGNOSIS — O2342 Unspecified infection of urinary tract in pregnancy, second trimester: Secondary | ICD-10-CM | POA: Insufficient documentation

## 2014-01-24 NOTE — Progress Notes (Signed)
1-2 UC's per day. Declined pelvic exam. PTL precautions. Urine TOC at [place].

## 2014-01-30 ENCOUNTER — Telehealth: Payer: Self-pay | Admitting: *Deleted

## 2014-01-30 ENCOUNTER — Telehealth: Payer: Self-pay | Admitting: General Practice

## 2014-01-30 ENCOUNTER — Encounter: Payer: Self-pay | Admitting: General Practice

## 2014-01-30 NOTE — Telephone Encounter (Signed)
Miranda Bowen left a message stating she has had  an ear infection for over a week and wants to know what is safe for her to take.

## 2014-01-30 NOTE — Telephone Encounter (Signed)
Patient called and left message stating she is 7.5 months pregnant and works 8:30-5:30 everyday and her due date is September 2 and she is trying to work less currently. 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 Called patient back and she states that her employer wanted a letter from us stating her due date. She states she still wants to work just a little less. Told patient I could provide a letter stating her due date but if she needed something stating taking her out of work or working less hours she would need to address that at her next appt. Patient verbalized understanding and stated she just needed a note with her due date. Patient had no other questions.

## 2014-01-30 NOTE — Telephone Encounter (Signed)
Called Miranda Bowen to further discuss her complaints- left a message I was returning her call and we will try her again at another time or she can call clinic .  Per chart review was seen in MAU 01/18/14 for sore throat/ear ache/cough and prescribed amoxicilin tid x 5 days. Then seen 01/24/14 for prenatal visit- no notes re: ear.

## 2014-01-31 NOTE — Telephone Encounter (Addendum)
Called pt and pt informed me that she is still having "feeling of being clogged on left side of ear with no pain" .  I advised pt that per Dr. Erin FullingHarraway-Smith she should try and take sudafed otc and take as prescribed and that we f/u with on her ob appt scheduled on 02/06/14.  Pt agreed.

## 2014-02-06 ENCOUNTER — Ambulatory Visit (INDEPENDENT_AMBULATORY_CARE_PROVIDER_SITE_OTHER): Payer: Medicaid Other | Admitting: Obstetrics and Gynecology

## 2014-02-06 ENCOUNTER — Encounter: Payer: Self-pay | Admitting: Advanced Practice Midwife

## 2014-02-06 ENCOUNTER — Encounter: Payer: Self-pay | Admitting: Obstetrics and Gynecology

## 2014-02-06 VITALS — BP 122/72 | HR 99 | Temp 97.3°F | Wt 177.9 lb

## 2014-02-06 DIAGNOSIS — O3421 Maternal care for scar from previous cesarean delivery: Secondary | ICD-10-CM

## 2014-02-06 DIAGNOSIS — O34219 Maternal care for unspecified type scar from previous cesarean delivery: Secondary | ICD-10-CM

## 2014-02-06 LAB — POCT URINALYSIS DIP (DEVICE)
Bilirubin Urine: NEGATIVE
GLUCOSE, UA: NEGATIVE mg/dL
Ketones, ur: NEGATIVE mg/dL
Leukocytes, UA: NEGATIVE
Nitrite: NEGATIVE
PROTEIN: NEGATIVE mg/dL
Specific Gravity, Urine: 1.02 (ref 1.005–1.030)
UROBILINOGEN UA: 0.2 mg/dL (ref 0.0–1.0)
pH: 6.5 (ref 5.0–8.0)

## 2014-02-06 NOTE — Patient Instructions (Signed)
Third Trimester of Pregnancy The third trimester is from week 29 through week 42, months 7 through 9. The third trimester is a time when the fetus is growing rapidly. At the end of the ninth month, the fetus is about 20 inches in length and weighs 6-10 pounds.  BODY CHANGES Your body goes through many changes during pregnancy. The changes vary from woman to woman.   Your weight will continue to increase. You can expect to gain 25-35 pounds (11-16 kg) by the end of the pregnancy.  You may begin to get stretch marks on your hips, abdomen, and breasts.  You may urinate more often because the fetus is moving lower into your pelvis and pressing on your bladder.  You may develop or continue to have heartburn as a result of your pregnancy.  You may develop constipation because certain hormones are causing the muscles that push waste through your intestines to slow down.  You may develop hemorrhoids or swollen, bulging veins (varicose veins).  You may have pelvic pain because of the weight gain and pregnancy hormones relaxing your joints between the bones in your pelvis. Backaches may result from overexertion of the muscles supporting your posture.  You may have changes in your hair. These can include thickening of your hair, rapid growth, and changes in texture. Some women also have hair loss during or after pregnancy, or hair that feels dry or thin. Your hair will most likely return to normal after your baby is born.  Your breasts will continue to grow and be tender. A yellow discharge may leak from your breasts called colostrum.  Your belly button may stick out.  You may feel short of breath because of your expanding uterus.  You may notice the fetus "dropping," or moving lower in your abdomen.  You may have a bloody mucus discharge. This usually occurs a few days to a week before labor begins.  Your cervix becomes thin and soft (effaced) near your due date. WHAT TO EXPECT AT YOUR PRENATAL  EXAMS  You will have prenatal exams every 2 weeks until week 36. Then, you will have weekly prenatal exams. During a routine prenatal visit:  You will be weighed to make sure you and the fetus are growing normally.  Your blood pressure is taken.  Your abdomen will be measured to track your baby's growth.  The fetal heartbeat will be listened to.  Any test results from the previous visit will be discussed.  You may have a cervical check near your due date to see if you have effaced. At around 36 weeks, your caregiver will check your cervix. At the same time, your caregiver will also perform a test on the secretions of the vaginal tissue. This test is to determine if a type of bacteria, Group B streptococcus, is present. Your caregiver will explain this further. Your caregiver may ask you:  What your birth plan is.  How you are feeling.  If you are feeling the baby move.  If you have had any abnormal symptoms, such as leaking fluid, bleeding, severe headaches, or abdominal cramping.  If you have any questions. Other tests or screenings that may be performed during your third trimester include:  Blood tests that check for low iron levels (anemia).  Fetal testing to check the health, activity level, and growth of the fetus. Testing is done if you have certain medical conditions or if there are problems during the pregnancy. FALSE LABOR You may feel small, irregular contractions that   eventually go away. These are called Braxton Hicks contractions, or false labor. Contractions may last for hours, days, or even weeks before true labor sets in. If contractions come at regular intervals, intensify, or become painful, it is best to be seen by your caregiver.  SIGNS OF LABOR   Menstrual-like cramps.  Contractions that are 5 minutes apart or less.  Contractions that start on the top of the uterus and spread down to the lower abdomen and back.  A sense of increased pelvic pressure or back  pain.  A watery or bloody mucus discharge that comes from the vagina. If you have any of these signs before the 37th week of pregnancy, call your caregiver right away. You need to go to the hospital to get checked immediately. HOME CARE INSTRUCTIONS   Avoid all smoking, herbs, alcohol, and unprescribed drugs. These chemicals affect the formation and growth of the baby.  Follow your caregiver's instructions regarding medicine use. There are medicines that are either safe or unsafe to take during pregnancy.  Exercise only as directed by your caregiver. Experiencing uterine cramps is a good sign to stop exercising.  Continue to eat regular, healthy meals.  Wear a good support bra for breast tenderness.  Do not use hot tubs, steam rooms, or saunas.  Wear your seat belt at all times when driving.  Avoid raw meat, uncooked cheese, cat litter boxes, and soil used by cats. These carry germs that can cause birth defects in the baby.  Take your prenatal vitamins.  Try taking a stool softener (if your caregiver approves) if you develop constipation. Eat more high-fiber foods, such as fresh vegetables or fruit and whole grains. Drink plenty of fluids to keep your urine clear or pale yellow.  Take warm sitz baths to soothe any pain or discomfort caused by hemorrhoids. Use hemorrhoid cream if your caregiver approves.  If you develop varicose veins, wear support hose. Elevate your feet for 15 minutes, 3-4 times a day. Limit salt in your diet.  Avoid heavy lifting, wear low heal shoes, and practice good posture.  Rest a lot with your legs elevated if you have leg cramps or low back pain.  Visit your dentist if you have not gone during your pregnancy. Use a soft toothbrush to brush your teeth and be gentle when you floss.  A sexual relationship may be continued unless your caregiver directs you otherwise.  Do not travel far distances unless it is absolutely necessary and only with the approval  of your caregiver.  Take prenatal classes to understand, practice, and ask questions about the labor and delivery.  Make a trial run to the hospital.  Pack your hospital bag.  Prepare the baby's nursery.  Continue to go to all your prenatal visits as directed by your caregiver. SEEK MEDICAL CARE IF:  You are unsure if you are in labor or if your water has broken.  You have dizziness.  You have mild pelvic cramps, pelvic pressure, or nagging pain in your abdominal area.  You have persistent nausea, vomiting, or diarrhea.  You have a bad smelling vaginal discharge.  You have pain with urination. SEEK IMMEDIATE MEDICAL CARE IF:   You have a fever.  You are leaking fluid from your vagina.  You have spotting or bleeding from your vagina.  You have severe abdominal cramping or pain.  You have rapid weight loss or gain.  You have shortness of breath with chest pain.  You notice sudden or extreme swelling   of your face, hands, ankles, feet, or legs.  You have not felt your baby move in over an hour.  You have severe headaches that do not go away with medicine.  You have vision changes. Document Released: 06/22/2001 Document Revised: 07/03/2013 Document Reviewed: 08/29/2012 [employer] Patient Information 2015 [employer]. This information is not intended to replace advice given to you by your health care provider. Make sure you discuss any questions you have with your health care provider.  

## 2014-02-06 NOTE — Progress Notes (Signed)
No Complaints today. 

## 2014-02-06 NOTE — Progress Notes (Signed)
Doing well. Still wants TOLAC. Considering PPS>sign consent next if desires.

## 2014-02-15 ENCOUNTER — Telehealth: Payer: Self-pay | Admitting: General Practice

## 2014-02-15 NOTE — Telephone Encounter (Signed)
Patient called and left message stating she is on her feet all day walking around and she is feeling a lot of pressure now and doesn't know if she is doing too much or not or if she needs to go ahead and be out of work and would like a callback. Called patient back and told her that it is very normal to feel pressure at this point in her pregnancy especially the closer she gets to the end. Patient verbalized understanding and asked how many contractions should she have before going to the hospital. Told patient she can come into the hospital if she is having contractions 4-5 minutes apart for longer than an hour. Patient verbalized understanding and asked how to time contractions. Told patient it is from the start of one to the beginning of another. Patient verbalized understanding and had no other questions

## 2014-02-20 ENCOUNTER — Encounter: Payer: Self-pay | Admitting: Advanced Practice Midwife

## 2014-02-20 ENCOUNTER — Ambulatory Visit (INDEPENDENT_AMBULATORY_CARE_PROVIDER_SITE_OTHER): Payer: Medicaid Other | Admitting: Advanced Practice Midwife

## 2014-02-20 VITALS — BP 100/80 | HR 98 | Temp 99.0°F | Wt 180.1 lb

## 2014-02-20 DIAGNOSIS — Z3493 Encounter for supervision of normal pregnancy, unspecified, third trimester: Secondary | ICD-10-CM

## 2014-02-20 DIAGNOSIS — Z98891 History of uterine scar from previous surgery: Secondary | ICD-10-CM

## 2014-02-20 DIAGNOSIS — Z9889 Other specified postprocedural states: Secondary | ICD-10-CM

## 2014-02-20 DIAGNOSIS — Z3480 Encounter for supervision of other normal pregnancy, unspecified trimester: Secondary | ICD-10-CM

## 2014-02-20 LAB — POCT URINALYSIS DIP (DEVICE)
BILIRUBIN URINE: NEGATIVE
Glucose, UA: NEGATIVE mg/dL
Ketones, ur: NEGATIVE mg/dL
Leukocytes, UA: NEGATIVE
NITRITE: NEGATIVE
Protein, ur: NEGATIVE mg/dL
Specific Gravity, Urine: 1.025 (ref 1.005–1.030)
Urobilinogen, UA: 0.2 mg/dL (ref 0.0–1.0)
pH: 6 (ref 5.0–8.0)

## 2014-02-20 NOTE — Progress Notes (Signed)
Patient states that she is very uncomfortable, and feeling a lot of pressure. She states that it is worse while she is at work. She would like to be taken out of work starting on 03/11/14. She is feeling some contractions, but she cannot time any contractions.

## 2014-02-20 NOTE — Progress Notes (Signed)
C/o of constant pelvic pressure and intermittent contractions.  Reports occasional edema in hands and feet.

## 2014-02-20 NOTE — Patient Instructions (Signed)
To whom it may concern,  [facility]. She will be unable to work at 03/11/2014.  Thank you,   Tawnya CrookHogan, Kissa Campoy Donovan

## 2014-03-07 ENCOUNTER — Encounter: Payer: Medicaid Other | Admitting: Advanced Practice Midwife

## 2014-03-08 ENCOUNTER — Ambulatory Visit (INDEPENDENT_AMBULATORY_CARE_PROVIDER_SITE_OTHER): Payer: Medicaid Other | Admitting: Obstetrics and Gynecology

## 2014-03-08 ENCOUNTER — Encounter: Payer: Self-pay | Admitting: Obstetrics and Gynecology

## 2014-03-08 VITALS — BP 120/81 | HR 108 | Temp 98.2°F | Wt 185.9 lb

## 2014-03-08 DIAGNOSIS — O3421 Maternal care for scar from previous cesarean delivery: Secondary | ICD-10-CM

## 2014-03-08 DIAGNOSIS — O34219 Maternal care for unspecified type scar from previous cesarean delivery: Secondary | ICD-10-CM

## 2014-03-08 LAB — POCT URINALYSIS DIP (DEVICE)
BILIRUBIN URINE: NEGATIVE
Glucose, UA: NEGATIVE mg/dL
Hgb urine dipstick: NEGATIVE
Ketones, ur: NEGATIVE mg/dL
Leukocytes, UA: NEGATIVE
Nitrite: NEGATIVE
Protein, ur: NEGATIVE mg/dL
Specific Gravity, Urine: 1.01 (ref 1.005–1.030)
UROBILINOGEN UA: 0.2 mg/dL (ref 0.0–1.0)
pH: 6.5 (ref 5.0–8.0)

## 2014-03-08 LAB — OB RESULTS CONSOLE GBS: STREP GROUP B AG: NEGATIVE

## 2014-03-08 NOTE — Progress Notes (Signed)
Good FM. CTS sx.> relief measures discussed. Try splints, Tylenol. Cultures done. S/sx labor rviewed. No labor with P1.

## 2014-03-08 NOTE — Progress Notes (Signed)
C/o of pelvic pressure and painful, irregular contractions.  Edema in hands and feet-- reports feeling numbness/tingling from elbows down to hands bilaterally.

## 2014-03-08 NOTE — Patient Instructions (Signed)

## 2014-03-09 LAB — GC/CHLAMYDIA PROBE AMP
CT Probe RNA: NEGATIVE
GC Probe RNA: NEGATIVE

## 2014-03-10 LAB — CULTURE, BETA STREP (GROUP B ONLY)

## 2014-03-11 ENCOUNTER — Encounter (HOSPITAL_COMMUNITY): Payer: Self-pay | Admitting: *Deleted

## 2014-03-11 ENCOUNTER — Inpatient Hospital Stay (HOSPITAL_COMMUNITY)
Admission: AD | Admit: 2014-03-11 | Discharge: 2014-03-11 | Disposition: A | Discharge status: Home / Self Care | Payer: Medicaid Other | Source: Ambulatory Visit | Attending: Obstetrics and Gynecology | Admitting: Obstetrics and Gynecology

## 2014-03-11 DIAGNOSIS — O479 False labor, unspecified: Secondary | ICD-10-CM | POA: Insufficient documentation

## 2014-03-11 NOTE — MAU Provider Note (Signed)
  History     CSN: [id number]  Arrival date and time: 03/11/14 4098   First Provider Initiated Contact with Patient 03/11/14 1052      Chief Complaint  Patient presents with  . Labor Eval   HPI   Patient is 28 y.o. [id number] [redacted]w[redacted]d.  +FM, denies LOF, VB, vaginal discharge.  Here with contractions, sleeping in room.  RN unable to complete cervical exam, examined by me.   Past Medical History  Diagnosis Date  . No pertinent past medical history     Past Surgical History  Procedure Laterality Date  . Cesarean section      Family History  Problem Relation Age of Onset  . Hypertension Mother     History  Substance Use Topics  . Smoking status: Never Smoker   . Smokeless tobacco: Never Used  . Alcohol Use: No    Allergies: No Known Allergies  Facility-administered medications prior to admission  Medication Dose Route Frequency Provider Last Rate Last Dose  . Tdap (BOOSTRIX) injection 0.5 mL  0.5 mL Intramuscular Once Hurshel Party, CNM       Prescriptions prior to admission  Medication Sig Dispense Refill  . Prenatal Vit-Fe Fumarate-FA (PRENATAL MULTIVITAMIN) TABS tablet Take 1 tablet by mouth daily at 12 noon.        Review of Systems  Constitutional: Negative for fever and chills.  Cardiovascular: Negative for chest pain and leg swelling.  Gastrointestinal: Negative for nausea, vomiting, diarrhea and constipation.  Genitourinary: Negative for dysuria, urgency and frequency.   Physical Exam   Blood pressure 122/79, pulse 88, temperature 98.7 F (37.1 C), temperature source Oral, resp. rate 20, last menstrual period 06/06/2013.  Physical Exam  Constitutional: She is oriented to person, place, and time. She appears well-developed and well-nourished.  HENT:  Head: Normocephalic and atraumatic.  Eyes: Conjunctivae and EOM are normal.  Neck: Normal range of motion.  Cardiovascular: Normal rate.   Respiratory: Effort normal. No respiratory distress.   GI: Soft. She exhibits no distension. There is no tenderness.  Musculoskeletal: Normal range of motion. She exhibits no edema.  Neurological: She is alert and oriented to person, place, and time.  Skin: Skin is warm and dry. No erythema.   Dilation: Closed Exam by:: Dr. Loreta Bowen  MAU Course  Procedures  MDM NST reactive  Assessment and Plan  PO hydration, labor precautions, likely early latent labor vs braxton hicks, cervix currently closed.  Miranda Bowen Miranda Bowen 03/11/2014, 11:03 AM

## 2014-03-11 NOTE — Discharge Instructions (Signed)
Braxton Hicks Contractions °Contractions of the uterus can occur throughout pregnancy. Contractions are not always a sign that you are in labor.  °WHAT ARE BRAXTON HICKS CONTRACTIONS?  °Contractions that occur before labor are called Braxton Hicks contractions, or false labor. Toward the end of pregnancy (32-34 weeks), these contractions can develop more often and may become more forceful. This is not true labor because these contractions do not result in opening (dilatation) and thinning of the cervix. They are sometimes difficult to tell apart from true labor because these contractions can be forceful and people have different pain tolerances. You should not feel embarrassed if you go to the hospital with false labor. Sometimes, the only way to tell if you are in true labor is for your health care provider to look for changes in the cervix. °If there are no prenatal problems or other health problems associated with the pregnancy, it is completely safe to be sent home with false labor and await the onset of true labor. °HOW CAN YOU TELL THE DIFFERENCE BETWEEN TRUE AND FALSE LABOR? °False Labor °· The contractions of false labor are usually shorter and not as hard as those of true labor.   °· The contractions are usually irregular.   °· The contractions are often felt in the front of the lower abdomen and in the groin.   °· The contractions may go away when you walk around or change positions while lying down.   °· The contractions get weaker and are shorter lasting as time goes on.   °· The contractions do not usually become progressively stronger, regular, and closer together as with true labor.   °True Labor °· Contractions in true labor last 30-70 seconds, become very regular, usually become more intense, and increase in frequency.   °· The contractions do not go away with walking.   °· The discomfort is usually felt in the top of the uterus and spreads to the lower abdomen and low back.   °· True labor can be  determined by your health care provider with an exam. This will show that the cervix is dilating and getting thinner.   °WHAT TO REMEMBER °· Keep up with your usual exercises and follow other instructions given by your health care provider.   °· Take medicines as directed by your health care provider.   °· Keep your regular prenatal appointments.   °· Eat and drink lightly if you think you are going into labor.   °· If Braxton Hicks contractions are making you uncomfortable:   °¨ Change your position from lying down or resting to walking, or from walking to resting.   °¨ Sit and rest in a tub of warm water.   °¨ Drink 2-3 glasses of water. Dehydration may cause these contractions.   °¨ Do slow and deep breathing several times an hour.   °WHEN SHOULD I SEEK IMMEDIATE MEDICAL CARE? °Seek immediate medical care if: °· Your contractions become stronger, more regular, and closer together.   °· You have fluid leaking or gushing from your vagina.   °· You have a fever.   °· You pass blood-tinged mucus.   °· You have vaginal bleeding.   °· You have continuous abdominal pain.   °· You have low back pain that you never had before.   °· You feel your baby's head pushing down and causing pelvic pressure.   °· Your baby is not moving as much as it used to.   °Document Released: 06/28/2005 Document Revised: 07/03/2013 Document Reviewed: 04/09/2013 °[employer]® Patient Information ©2015 [employer]. This information is not intended to replace advice given to you by your health care   provider. Make sure you discuss any questions you have with your health care provider. Third Trimester of Pregnancy The third trimester is from week 29 through week 42, months 7 through 9. The third trimester is a time when the fetus is growing rapidly. At the end of the ninth month, the fetus is about 20 inches in length and weighs 6-10 pounds.  BODY CHANGES Your body goes through many changes during pregnancy. The changes vary from woman to woman.    Your weight will continue to increase. You can expect to gain 25-35 pounds (11-16 kg) by the end of the pregnancy.  You may begin to get stretch marks on your hips, abdomen, and breasts.  You may urinate more often because the fetus is moving lower into your pelvis and pressing on your bladder.  You may develop or continue to have heartburn as a result of your pregnancy.  You may develop constipation because certain hormones are causing the muscles that push waste through your intestines to slow down.  You may develop hemorrhoids or swollen, bulging veins (varicose veins).  You may have pelvic pain because of the weight gain and pregnancy hormones relaxing your joints between the bones in your pelvis. Backaches may result from overexertion of the muscles supporting your posture.  You may have changes in your hair. These can include thickening of your hair, rapid growth, and changes in texture. Some women also have hair loss during or after pregnancy, or hair that feels dry or thin. Your hair will most likely return to normal after your baby is born.  Your breasts will continue to grow and be tender. A yellow discharge may leak from your breasts called colostrum.  Your belly button may stick out.  You may feel short of breath because of your expanding uterus.  You may notice the fetus "dropping," or moving lower in your abdomen.  You may have a bloody mucus discharge. This usually occurs a few days to a week before labor begins.  Your cervix becomes thin and soft (effaced) near your due date. WHAT TO EXPECT AT YOUR PRENATAL EXAMS  You will have prenatal exams every 2 weeks until week 36. Then, you will have weekly prenatal exams. During a routine prenatal visit:  You will be weighed to make sure you and the fetus are growing normally.  Your blood pressure is taken.  Your abdomen will be measured to track your baby's growth.  The fetal heartbeat will be listened to.  Any test  results from the previous visit will be discussed.  You may have a cervical check near your due date to see if you have effaced. At around 36 weeks, your caregiver will check your cervix. At the same time, your caregiver will also perform a test on the secretions of the vaginal tissue. This test is to determine if a type of bacteria, Group B streptococcus, is present. Your caregiver will explain this further. Your caregiver may ask you:  What your birth plan is.  How you are feeling.  If you are feeling the baby move.  If you have had any abnormal symptoms, such as leaking fluid, bleeding, severe headaches, or abdominal cramping.  If you have any questions. Other tests or screenings that may be performed during your third trimester include:  Blood tests that check for low iron levels (anemia).  Fetal testing to check the health, activity level, and growth of the fetus. Testing is done if you have certain medical conditions or if  there are problems during the pregnancy. FALSE LABOR You may feel small, irregular contractions that eventually go away. These are called Braxton Hicks contractions, or false labor. Contractions may last for hours, days, or even weeks before true labor sets in. If contractions come at regular intervals, intensify, or become painful, it is best to be seen by your caregiver.  SIGNS OF LABOR   Menstrual-like cramps.  Contractions that are 5 minutes apart or less.  Contractions that start on the top of the uterus and spread down to the lower abdomen and back.  A sense of increased pelvic pressure or back pain.  A watery or bloody mucus discharge that comes from the vagina. If you have any of these signs before the 37th week of pregnancy, call your caregiver right away. You need to go to the hospital to get checked immediately. HOME CARE INSTRUCTIONS   Avoid all smoking, herbs, alcohol, and unprescribed drugs. These chemicals affect the formation and growth of  the baby.  Follow your caregiver's instructions regarding medicine use. There are medicines that are either safe or unsafe to take during pregnancy.  Exercise only as directed by your caregiver. Experiencing uterine cramps is a good sign to stop exercising.  Continue to eat regular, healthy meals.  Wear a good support bra for breast tenderness.  Do not use hot tubs, steam rooms, or saunas.  Wear your seat belt at all times when driving.  Avoid raw meat, uncooked cheese, cat litter boxes, and soil used by cats. These carry germs that can cause birth defects in the baby.  Take your prenatal vitamins.  Try taking a stool softener (if your caregiver approves) if you develop constipation. Eat more high-fiber foods, such as fresh vegetables or fruit and whole grains. Drink plenty of fluids to keep your urine clear or pale yellow.  Take warm sitz baths to soothe any pain or discomfort caused by hemorrhoids. Use hemorrhoid cream if your caregiver approves.  If you develop varicose veins, wear support hose. Elevate your feet for 15 minutes, 3-4 times a day. Limit salt in your diet.  Avoid heavy lifting, wear low heal shoes, and practice good posture.  Rest a lot with your legs elevated if you have leg cramps or low back pain.  Visit your dentist if you have not gone during your pregnancy. Use a soft toothbrush to brush your teeth and be gentle when you floss.  A sexual relationship may be continued unless your caregiver directs you otherwise.  Do not travel far distances unless it is absolutely necessary and only with the approval of your caregiver.  Take prenatal classes to understand, practice, and ask questions about the labor and delivery.  Make a trial run to the hospital.  Pack your hospital bag.  Prepare the baby's nursery.  Continue to go to all your prenatal visits as directed by your caregiver. SEEK MEDICAL CARE IF:  You are unsure if you are in labor or if your water  has broken.  You have dizziness.  You have mild pelvic cramps, pelvic pressure, or nagging pain in your abdominal area.  You have persistent nausea, vomiting, or diarrhea.  You have a bad smelling vaginal discharge.  You have pain with urination. SEEK IMMEDIATE MEDICAL CARE IF:   You have a fever.  You are leaking fluid from your vagina.  You have spotting or bleeding from your vagina.  You have severe abdominal cramping or pain.  You have rapid weight loss or gain.  You have shortness of breath with chest pain.  You notice sudden or extreme swelling of your face, hands, ankles, feet, or legs.  You have not felt your baby move in over an hour.  You have severe headaches that do not go away with medicine.  You have vision changes. Document Released: 06/22/2001 Document Revised: 07/03/2013 Document Reviewed: 08/29/2012 [facility] Patient Information 2015 [place], [place]. This information is not intended to replace advice given to you by your health care provider. Make sure you discuss any questions you have with your health care provider.

## 2014-03-11 NOTE — MAU Note (Signed)
uc's since 0830, severe pelvic pressure.  Denies bleeding or LOF.  SVE 2 weeks ago @ last office visit, 2 cm's.

## 2014-03-12 NOTE — MAU Provider Note (Signed)
Attestation of Attending Supervision of Advanced Practitioner (CNM/NP): Evaluation and management procedures were performed by the Advanced Practitioner under my supervision and collaboration.  I have reviewed the Advanced Practitioner's note and chart, and I agree with the management and plan.  Ceejay Kegley 03/12/2014 7:29 AM   

## 2014-03-15 ENCOUNTER — Encounter (HOSPITAL_COMMUNITY): Payer: Self-pay | Admitting: *Deleted

## 2014-03-15 ENCOUNTER — Inpatient Hospital Stay (HOSPITAL_COMMUNITY)
Admission: AD | Admit: 2014-03-15 | Discharge: 2014-03-15 | Disposition: A | Discharge status: Home / Self Care | Payer: Medicaid Other | Source: Ambulatory Visit | Attending: Obstetrics & Gynecology | Admitting: Obstetrics & Gynecology

## 2014-03-15 DIAGNOSIS — O99891 Other specified diseases and conditions complicating pregnancy: Secondary | ICD-10-CM | POA: Insufficient documentation

## 2014-03-15 DIAGNOSIS — R109 Unspecified abdominal pain: Secondary | ICD-10-CM | POA: Diagnosis present

## 2014-03-15 DIAGNOSIS — O9989 Other specified diseases and conditions complicating pregnancy, childbirth and the puerperium: Principal | ICD-10-CM

## 2014-03-15 DIAGNOSIS — Z3493 Encounter for supervision of normal pregnancy, unspecified, third trimester: Secondary | ICD-10-CM

## 2014-03-15 DIAGNOSIS — O2342 Unspecified infection of urinary tract in pregnancy, second trimester: Secondary | ICD-10-CM

## 2014-03-15 DIAGNOSIS — O34219 Maternal care for unspecified type scar from previous cesarean delivery: Secondary | ICD-10-CM

## 2014-03-15 NOTE — MAU Note (Signed)
Pt reports she is having lower abd pain  On and off all day. Good fetal movement reported. Denies SROM or bleeding at this time

## 2014-03-20 ENCOUNTER — Ambulatory Visit (INDEPENDENT_AMBULATORY_CARE_PROVIDER_SITE_OTHER): Payer: Medicaid Other | Admitting: Advanced Practice Midwife

## 2014-03-20 VITALS — BP 122/80 | HR 82 | Wt 181.8 lb

## 2014-03-20 DIAGNOSIS — Z3480 Encounter for supervision of other normal pregnancy, unspecified trimester: Secondary | ICD-10-CM

## 2014-03-20 DIAGNOSIS — O34219 Maternal care for unspecified type scar from previous cesarean delivery: Secondary | ICD-10-CM

## 2014-03-20 DIAGNOSIS — Z3493 Encounter for supervision of normal pregnancy, unspecified, third trimester: Secondary | ICD-10-CM

## 2014-03-20 DIAGNOSIS — Z23 Encounter for immunization: Secondary | ICD-10-CM

## 2014-03-20 DIAGNOSIS — O3421 Maternal care for scar from previous cesarean delivery: Secondary | ICD-10-CM

## 2014-03-20 DIAGNOSIS — Z3490 Encounter for supervision of normal pregnancy, unspecified, unspecified trimester: Secondary | ICD-10-CM | POA: Insufficient documentation

## 2014-03-20 LAB — POCT URINALYSIS DIP (DEVICE)
Bilirubin Urine: NEGATIVE
GLUCOSE, UA: NEGATIVE mg/dL
HGB URINE DIPSTICK: NEGATIVE
KETONES UR: NEGATIVE mg/dL
Nitrite: NEGATIVE
Protein, ur: NEGATIVE mg/dL
SPECIFIC GRAVITY, URINE: 1.02 (ref 1.005–1.030)
Urobilinogen, UA: 1 mg/dL (ref 0.0–1.0)
pH: 6 (ref 5.0–8.0)

## 2014-03-20 NOTE — Progress Notes (Signed)
Very uncomfortable w/ BH, pressure. Requesting IOL. Discussed importance of SOL, especially w/ TOLAC. Pt miserable, tearful. Broke up w/ boyfriend last month ago. Worried about having 28 year-old and baby w/ no help. Mother supportive. Will try foley bulb induction at 40 weeks, but cannot induce if foley cannot be placed.

## 2014-03-20 NOTE — Progress Notes (Signed)
Declined flu shot

## 2014-03-20 NOTE — Patient Instructions (Signed)
Braxton Hicks Contractions Contractions of the uterus can occur throughout pregnancy. Contractions are not always a sign that you are in labor.  WHAT ARE BRAXTON HICKS CONTRACTIONS?  Contractions that occur before labor are called Braxton Hicks contractions, or false labor. Toward the end of pregnancy (32-34 weeks), these contractions can develop more often and may become more forceful. This is not true labor because these contractions do not result in opening (dilatation) and thinning of the cervix. They are sometimes difficult to tell apart from true labor because these contractions can be forceful and people have different pain tolerances. You should not feel embarrassed if you go to the hospital with false labor. Sometimes, the only way to tell if you are in true labor is for your health care provider to look for changes in the cervix. If there are no prenatal problems or other health problems associated with the pregnancy, it is completely safe to be sent home with false labor and await the onset of true labor. HOW CAN YOU TELL THE DIFFERENCE BETWEEN TRUE AND FALSE LABOR? False Labor  The contractions of false labor are usually shorter and not as hard as those of true labor.   The contractions are usually irregular.   The contractions are often felt in the front of the lower abdomen and in the groin.   The contractions may go away when you walk around or change positions while lying down.   The contractions get weaker and are shorter lasting as time goes on.   The contractions do not usually become progressively stronger, regular, and closer together as with true labor.  True Labor  Contractions in true labor last 30-70 seconds, become very regular, usually become more intense, and increase in frequency.   The contractions do not go away with walking.   The discomfort is usually felt in the top of the uterus and spreads to the lower abdomen and low back.   True labor can be  determined by your health care provider with an exam. This will show that the cervix is dilating and getting thinner.  WHAT TO REMEMBER  Keep up with your usual exercises and follow other instructions given by your health care provider.   Take medicines as directed by your health care provider.   Keep your regular prenatal appointments.   Eat and drink lightly if you think you are going into labor.   If Braxton Hicks contractions are making you uncomfortable:   Change your position from lying down or resting to walking, or from walking to resting.   Sit and rest in a tub of warm water.   Drink 2-3 glasses of water. Dehydration may cause these contractions.   Do slow and deep breathing several times an hour.  WHEN SHOULD I SEEK IMMEDIATE MEDICAL CARE? Seek immediate medical care if:  Your contractions become stronger, more regular, and closer together.   You have fluid leaking or gushing from your vagina.   You have a fever.   You pass blood-tinged mucus.   You have vaginal bleeding.   You have continuous abdominal pain.   You have low back pain that you never had before.   You feel your baby's head pushing down and causing pelvic pressure.   Your baby is not moving as much as it used to.  Document Released: 06/28/2005 Document Revised: 07/03/2013 Document Reviewed: 04/09/2013 Exit[employer] Patient Information 2015 [employer]. This information is not intended to replace advice given to you by your health care   provider. Make sure you discuss any questions you have with your health care provider.  Breastfeeding Deciding to breastfeed is one of the best choices you can make for you and your baby. A change in hormones during pregnancy causes your breast tissue to grow and increases the number and size of your milk ducts. These hormones also allow proteins, sugars, and fats from your blood supply to make breast milk in your milk-producing glands. Hormones  prevent breast milk from being released before your baby is born as well as prompt milk flow after birth. Once breastfeeding has begun, thoughts of your baby, as well as his or her sucking or crying, can stimulate the release of milk from your milk-producing glands.  BENEFITS OF BREASTFEEDING For Your Baby  Your first milk (colostrum) helps your baby's digestive system function better.   There are antibodies in your milk that help your baby fight off infections.   Your baby has a lower incidence of asthma, allergies, and sudden infant death syndrome.   The nutrients in breast milk are better for your baby than infant formulas and are designed uniquely for your baby's needs.   Breast milk improves your baby's brain development.   Your baby is less likely to develop other conditions, such as childhood obesity, asthma, or type 2 diabetes mellitus.  For You   Breastfeeding helps to create a very special bond between you and your baby.   Breastfeeding is convenient. Breast milk is always available at the correct temperature and costs nothing.   Breastfeeding helps to burn calories and helps you lose the weight gained during pregnancy.   Breastfeeding makes your uterus contract to its prepregnancy size faster and slows bleeding (lochia) after you give birth.   Breastfeeding helps to lower your risk of developing type 2 diabetes mellitus, osteoporosis, and breast or ovarian cancer later in life. SIGNS THAT YOUR BABY IS HUNGRY Early Signs of Hunger  Increased alertness or activity.  Stretching.  Movement of the head from side to side.  Movement of the head and opening of the mouth when the corner of the mouth or cheek is stroked (rooting).  Increased sucking sounds, smacking lips, cooing, sighing, or squeaking.  Hand-to-mouth movements.  Increased sucking of fingers or hands. Late Signs of Hunger  Fussing.  Intermittent crying. Extreme Signs of Hunger Signs of  extreme hunger will require calming and consoling before your baby will be able to breastfeed successfully. Do not wait for the following signs of extreme hunger to occur before you initiate breastfeeding:   Restlessness.  A loud, strong cry.   Screaming. BREASTFEEDING BASICS Breastfeeding Initiation  Find a comfortable place to sit or lie down, with your neck and back well supported.  Place a pillow or rolled up blanket under your baby to bring him or her to the level of your breast (if you are seated). Nursing pillows are specially designed to help support your arms and your baby while you breastfeed.  Make sure that your baby's abdomen is facing your abdomen.   Gently massage your breast. With your fingertips, massage from your chest wall toward your nipple in a circular motion. This encourages milk flow. You may need to continue this action during the feeding if your milk flows slowly.  Support your breast with 4 fingers underneath and your thumb above your nipple. Make sure your fingers are well away from your nipple and your baby's mouth.   Stroke your baby's lips gently with your finger or nipple.     When your baby's mouth is open wide enough, quickly bring your baby to your breast, placing your entire nipple and as much of the colored area around your nipple (areola) as possible into your baby's mouth.   More areola should be visible above your baby's upper lip than below the lower lip.   Your baby's tongue should be between his or her lower gum and your breast.   Ensure that your baby's mouth is correctly positioned around your nipple (latched). Your baby's lips should create a seal on your breast and be turned out (everted).  It is common for your baby to suck about 2-3 minutes in order to start the flow of breast milk. Latching Teaching your baby how to latch on to your breast properly is very important. An improper latch can cause nipple pain and decreased milk  supply for you and poor weight gain in your baby. Also, if your baby is not latched onto your nipple properly, he or she may swallow some air during feeding. This can make your baby fussy. Burping your baby when you switch breasts during the feeding can help to get rid of the air. However, teaching your baby to latch on properly is still the best way to prevent fussiness from swallowing air while breastfeeding. Signs that your baby has successfully latched on to your nipple:    Silent tugging or silent sucking, without causing you pain.   Swallowing heard between every 3-4 sucks.    Muscle movement above and in front of his or her ears while sucking.  Signs that your baby has not successfully latched on to nipple:   Sucking sounds or smacking sounds from your baby while breastfeeding.  Nipple pain. If you think your baby has not latched on correctly, slip your finger into the corner of your baby's mouth to break the suction and place it between your baby's gums. Attempt breastfeeding initiation again. Signs of Successful Breastfeeding Signs from your baby:   A gradual decrease in the number of sucks or complete cessation of sucking.   Falling asleep.   Relaxation of his or her body.   Retention of a small amount of milk in his or her mouth.   Letting go of your breast by himself or herself. Signs from you:  Breasts that have increased in firmness, weight, and size 1-3 hours after feeding.   Breasts that are softer immediately after breastfeeding.  Increased milk volume, as well as a change in milk consistency and color by the fifth day of breastfeeding.   Nipples that are not sore, cracked, or bleeding. Signs That Your Baby is Getting Enough Milk  Wetting at least 3 diapers in a 24-hour period. The urine should be clear and pale yellow by age 5 days.  At least 3 stools in a 24-hour period by age 5 days. The stool should be soft and yellow.  At least 3 stools in a  24-hour period by age 7 days. The stool should be seedy and yellow.  No loss of weight greater than 10% of birth weight during the first 3 days of age.  Average weight gain of 4-7 ounces (113-198 g) per week after age 4 days.  Consistent daily weight gain by age 5 days, without weight loss after the age of 2 weeks. After a feeding, your baby may spit up a small amount. This is common. BREASTFEEDING FREQUENCY AND DURATION Frequent feeding will help you make more milk and can prevent sore nipples and breast   engorgement. Breastfeed when you feel the need to reduce the fullness of your breasts or when your baby shows signs of hunger. This is called "breastfeeding on demand." Avoid introducing a pacifier to your baby while you are working to establish breastfeeding (the first 4-6 weeks after your baby is born). After this time you may choose to use a pacifier. Research has shown that pacifier use during the first year of a baby's life decreases the risk of sudden infant death syndrome (SIDS). Allow your baby to feed on each breast as long as he or she wants. Breastfeed until your baby is finished feeding. When your baby unlatches or falls asleep while feeding from the first breast, offer the second breast. Because newborns are often sleepy in the first few weeks of life, you may need to awaken your baby to get him or her to feed. Breastfeeding times will vary from baby to baby. However, the following rules can serve as a guide to help you ensure that your baby is properly fed:  Newborns (babies 4 weeks of age or younger) may breastfeed every 1-3 hours.  Newborns should not go longer than 3 hours during the day or 5 hours during the night without breastfeeding.  You should breastfeed your baby a minimum of 8 times in a 24-hour period until you begin to introduce solid foods to your baby at around 6 months of age. BREAST MILK PUMPING Pumping and storing breast milk allows you to ensure that your baby is  exclusively fed your breast milk, even at times when you are unable to breastfeed. This is especially important if you are going back to work while you are still breastfeeding or when you are not able to be present during feedings. Your lactation consultant can give you guidelines on how long it is safe to store breast milk.  A breast pump is a machine that allows you to pump milk from your breast into a sterile bottle. The pumped breast milk can then be stored in a refrigerator or freezer. Some breast pumps are operated by hand, while others use electricity. Ask your lactation consultant which type will work best for you. Breast pumps can be purchased, but some hospitals and breastfeeding support groups lease breast pumps on a monthly basis. A lactation consultant can teach you how to hand express breast milk, if you prefer not to use a pump.  CARING FOR YOUR BREASTS WHILE YOU BREASTFEED Nipples can become dry, cracked, and sore while breastfeeding. The following recommendations can help keep your breasts moisturized and healthy:  Avoid using soap on your nipples.   Wear a supportive bra. Although not required, special nursing bras and tank tops are designed to allow access to your breasts for breastfeeding without taking off your entire bra or top. Avoid wearing underwire-style bras or extremely tight bras.  Air dry your nipples for 3-4minutes after each feeding.   Use only cotton bra pads to absorb leaked breast milk. Leaking of breast milk between feedings is normal.   Use lanolin on your nipples after breastfeeding. Lanolin helps to maintain your skin's normal moisture barrier. If you use pure lanolin, you do not need to wash it off before feeding your baby again. Pure lanolin is not toxic to your baby. You may also hand express a few drops of breast milk and gently massage that milk into your nipples and allow the milk to air dry. In the first few weeks after giving birth, some women  experience extremely full   breasts (engorgement). Engorgement can make your breasts feel heavy, warm, and tender to the touch. Engorgement peaks within 3-5 days after you give birth. The following recommendations can help ease engorgement:  Completely empty your breasts while breastfeeding or pumping. You may want to start by applying warm, moist heat (in the shower or with warm water-soaked hand towels) just before feeding or pumping. This increases circulation and helps the milk flow. If your baby does not completely empty your breasts while breastfeeding, pump any extra milk after he or she is finished.  Wear a snug bra (nursing or regular) or tank top for 1-2 days to signal your body to slightly decrease milk production.  Apply ice packs to your breasts, unless this is too uncomfortable for you.  Make sure that your baby is latched on and positioned properly while breastfeeding. If engorgement persists after 48 hours of following these recommendations, contact your health care provider or a lactation consultant. OVERALL HEALTH CARE RECOMMENDATIONS WHILE BREASTFEEDING  Eat healthy foods. Alternate between meals and snacks, eating 3 of each per day. Because what you eat affects your breast milk, some of the foods may make your baby more irritable than usual. Avoid eating these foods if you are sure that they are negatively affecting your baby.  Drink milk, fruit juice, and water to satisfy your thirst (about 10 glasses a day).   Rest often, relax, and continue to take your prenatal vitamins to prevent fatigue, stress, and anemia.  Continue breast self-awareness checks.  Avoid chewing and smoking tobacco.  Avoid alcohol and drug use. Some medicines that may be harmful to your baby can pass through breast milk. It is important to ask your health care provider before taking any medicine, including all over-the-counter and prescription medicine as well as vitamin and herbal supplements. It is  possible to become pregnant while breastfeeding. If birth control is desired, ask your health care provider about options that will be safe for your baby. SEEK MEDICAL CARE IF:   You feel like you want to stop breastfeeding or have become frustrated with breastfeeding.  You have painful breasts or nipples.  Your nipples are cracked or bleeding.  Your breasts are red, tender, or warm.  You have a swollen area on either breast.  You have a fever or chills.  You have nausea or vomiting.  You have drainage other than breast milk from your nipples.  Your breasts do not become full before feedings by the fifth day after you give birth.  You feel sad and depressed.  Your baby is too sleepy to eat well.  Your baby is having trouble sleeping.   Your baby is wetting less than 3 diapers in a 24-hour period.  Your baby has less than 3 stools in a 24-hour period.  Your baby's skin or the white part of his or her eyes becomes yellow.   Your baby is not gaining weight by 5 days of age. SEEK IMMEDIATE MEDICAL CARE IF:   Your baby is overly tired (lethargic) and does not want to wake up and feed.  Your baby develops an unexplained fever. Document Released: 06/28/2005 Document Revised: 07/03/2013 Document Reviewed: 12/20/2012 Exit[employer] Patient Information 2015 [employer]. This information is not intended to replace advice given to you by your health care provider. Make sure you discuss any questions you have with your health care provider.  

## 2014-03-21 ENCOUNTER — Telehealth (HOSPITAL_COMMUNITY): Payer: Self-pay | Admitting: *Deleted

## 2014-03-21 ENCOUNTER — Encounter (HOSPITAL_COMMUNITY): Payer: Self-pay | Admitting: *Deleted

## 2014-03-21 NOTE — Telephone Encounter (Signed)
Preadmission screen  

## 2014-03-25 ENCOUNTER — Encounter (HOSPITAL_COMMUNITY): Payer: Self-pay

## 2014-03-25 ENCOUNTER — Inpatient Hospital Stay (HOSPITAL_COMMUNITY)
Admission: RE | Admit: 2014-03-25 | Discharge: 2014-03-29 | DRG: 766 | Disposition: A | Discharge status: Home / Self Care | Payer: Medicaid Other | Source: Ambulatory Visit | Attending: Family Medicine | Admitting: Family Medicine

## 2014-03-25 VITALS — BP 106/77 | HR 72 | Temp 98.5°F | Resp 16 | Ht 67.0 in | Wt 180.0 lb

## 2014-03-25 DIAGNOSIS — Z8249 Family history of ischemic heart disease and other diseases of the circulatory system: Secondary | ICD-10-CM

## 2014-03-25 DIAGNOSIS — O99891 Other specified diseases and conditions complicating pregnancy: Secondary | ICD-10-CM | POA: Diagnosis present

## 2014-03-25 DIAGNOSIS — O34219 Maternal care for unspecified type scar from previous cesarean delivery: Secondary | ICD-10-CM

## 2014-03-25 DIAGNOSIS — O411090 Infection of amniotic sac and membranes, unspecified, unspecified trimester, not applicable or unspecified: Secondary | ICD-10-CM | POA: Diagnosis present

## 2014-03-25 DIAGNOSIS — O480 Post-term pregnancy: Secondary | ICD-10-CM | POA: Diagnosis present

## 2014-03-25 DIAGNOSIS — Z3493 Encounter for supervision of normal pregnancy, unspecified, third trimester: Secondary | ICD-10-CM

## 2014-03-25 DIAGNOSIS — O2342 Unspecified infection of urinary tract in pregnancy, second trimester: Secondary | ICD-10-CM

## 2014-03-25 HISTORY — DX: Other specified health status: Z78.9

## 2014-03-25 LAB — CBC
HCT: 32.9 % — ABNORMAL LOW (ref 36.0–46.0)
Hemoglobin: 10.5 g/dL — ABNORMAL LOW (ref 12.0–15.0)
MCH: 27.3 pg (ref 26.0–34.0)
MCHC: 31.9 g/dL (ref 30.0–36.0)
MCV: 85.7 fL (ref 78.0–100.0)
PLATELETS: 324 10*3/uL (ref 150–400)
RBC: 3.84 MIL/uL — AB (ref 3.87–5.11)
RDW: 14.4 % (ref 11.5–15.5)
WBC: 10.3 10*3/uL (ref 4.0–10.5)

## 2014-03-25 LAB — RPR

## 2014-03-25 LAB — TYPE AND SCREEN
ABO/RH(D): A POS
ANTIBODY SCREEN: NEGATIVE

## 2014-03-25 MED ORDER — LACTATED RINGERS IV SOLN
INTRAVENOUS | Status: DC
  Administered 2014-03-25 – 2014-03-26 (×3): via INTRAVENOUS

## 2014-03-25 MED ORDER — PROMETHAZINE HCL 25 MG/ML IJ SOLN
25.0000 mg | Freq: Four times a day (QID) | INTRAMUSCULAR | Status: DC | PRN
  Administered 2014-03-25 (×2): 25 mg via INTRAVENOUS
  Filled 2014-03-25 (×2): qty 1

## 2014-03-25 MED ORDER — ACETAMINOPHEN 325 MG PO TABS
650.0000 mg | ORAL_TABLET | ORAL | Status: DC | PRN
  Administered 2014-03-25 (×2): 650 mg via ORAL
  Filled 2014-03-25 (×2): qty 2

## 2014-03-25 MED ORDER — ONDANSETRON HCL 4 MG/2ML IJ SOLN: 4.0000 mg | Freq: Four times a day (QID) | INTRAMUSCULAR | Status: DC | PRN

## 2014-03-25 MED ORDER — NALBUPHINE HCL 10 MG/ML IJ SOLN
10.0000 mg | INTRAMUSCULAR | Status: DC | PRN
  Administered 2014-03-25 (×2): 10 mg via INTRAVENOUS
  Filled 2014-03-25 (×2): qty 1

## 2014-03-25 MED ORDER — OXYTOCIN 40 UNITS IN LACTATED RINGERS INFUSION - SIMPLE MED
62.5000 mL/h | INTRAVENOUS | Status: DC
  Filled 2014-03-25: qty 1000

## 2014-03-25 MED ORDER — LIDOCAINE HCL (PF) 1 % IJ SOLN
30.0000 mL | INTRAMUSCULAR | Status: AC | PRN
  Administered 2014-03-26 (×2): 5 mL via SUBCUTANEOUS

## 2014-03-25 MED ORDER — CITRIC ACID-SODIUM CITRATE 334-500 MG/5ML PO SOLN
30.0000 mL | ORAL | Status: DC | PRN
  Administered 2014-03-26: 30 mL via ORAL
  Filled 2014-03-25: qty 15

## 2014-03-25 MED ORDER — TERBUTALINE SULFATE 1 MG/ML IJ SOLN: 0.2500 mg | Freq: Once | INTRAMUSCULAR | Status: AC | PRN

## 2014-03-25 MED ORDER — OXYTOCIN BOLUS FROM INFUSION: 500.0000 mL | INTRAVENOUS | Status: DC

## 2014-03-25 MED ORDER — OXYTOCIN 40 UNITS IN LACTATED RINGERS INFUSION - SIMPLE MED
1.0000 m[IU]/min | INTRAVENOUS | Status: DC
  Administered 2014-03-25: 1 m[IU]/min via INTRAVENOUS

## 2014-03-25 MED ORDER — FENTANYL CITRATE 0.05 MG/ML IJ SOLN
100.0000 ug | INTRAMUSCULAR | Status: DC | PRN
  Administered 2014-03-25 (×2): 100 ug via INTRAVENOUS
  Filled 2014-03-25 (×2): qty 2

## 2014-03-25 MED ORDER — LACTATED RINGERS IV SOLN: 500.0000 mL | INTRAVENOUS | Status: DC | PRN

## 2014-03-25 NOTE — H&P (Signed)
Attestation of Attending Supervision of Advanced Practitioner (PA/CNM/NP): Evaluation and management procedures were performed by the Advanced Practitioner under my supervision and collaboration.  I have reviewed the Advanced Practitioner's note and chart, and I agree with the management and plan.  Jacob Stinson, DO Attending Physician Faculty Practice, [facility]  

## 2014-03-25 NOTE — H&P (Signed)
HPI: Miranda Bowen is a 28 y.o. year old G93P1001 female at [redacted]w[redacted]d weeks gestation who presents to [facility] for IOL for social issues. FOB left her and she has limited social support. Lives alone w/ her young child. Pt was strongly urged at her last office visit to await SOL, but pt very strongly desires IOL. Explained the it can only be done if a foley bulb can be placed successfully.   Patient Active Problem List   Diagnosis Date Noted  . Post-dates pregnancy 03/25/2014  . Supervision of normal pregnancy 03/20/2014  . UTI (urinary tract infection) in pregnancy in second trimester 01/24/2014  . H/O cesarean section complicating pregnancy-[place] 08/28/2013   Clinic  Low Risk  Dating LMP:  [facility] 03/13/14   Ultrasound: 5wks (yolk sac)       Ultrasound consistent with LMP: No   EDC 03/25/14 by 5 wk [place]  Genetic Screen 1 Screen:  Declined       AFP:    Quad:  Neg        Anatomic [place] normal  GTT Early:               Third trimester: 135  TDaP vaccine  12/2013  Flu vaccine  Given 08/28/13  GBS neg  Baby Food  Both  Contraception  Undecided  Circumcision   Yes  Pediatrician  [facility]  Support Person Mother   Maternal Medical History:  Reason for admission: Nausea.    OB History   Grav Para Term Preterm Abortions TAB SAB Ect Mult Living   0  0   1     Past Medical History  Diagnosis Date  . No pertinent past medical history   . Medical history non-contributory    Past Surgical History  Procedure Laterality Date  . Cesarean section     Family History: family history includes Hypertension in her mother. Social History:  reports that she has never smoked. She has never used smokeless tobacco. She reports that she does not drink alcohol or use illicit drugs.   Prenatal Transfer Tool  Maternal Diabetes: No Genetic Screening: Normal Maternal Ultrasounds/Referrals: Normal Fetal Ultrasounds or other Referrals:  None Maternal Substance Abuse:  No Significant  Maternal Medications:  None Significant Maternal Lab Results:  Lab values include: Group B Strep negative Other Comments:  None  Review of Systems  Constitutional: Negative for fever and chills.  Eyes: Negative for blurred vision.  Gastrointestinal: Negative for nausea, vomiting and abdominal pain.  Neurological: Negative for headaches.    Dilation: 1 Effacement (%): 60 Station: -3 Exam by:: Diyan Dave cnm Blood pressure 119/90, pulse 80, temperature 97.9 F (36.6 C), temperature source Oral, resp. rate 18, height  (1.702 m), weight 81.647 kg (180 lb), last menstrual period 06/06/2013. Maternal Exam:  Uterine Assessment: Contraction strength is mild.  Contraction frequency is irregular.   Abdomen: Estimated fetal weight is 7-6.   Fetal presentation: vertex  Introitus: Normal vulva. Normal vagina.  Vagina is negative for discharge.  Pelvis: adequate for delivery.   Cervix: Cervix evaluated by sterile speculum exam and digital exam.     Fetal Exam Fetal Monitor Review: Mode: ultrasound.   Baseline rate: 145.  Variability: moderate (6-25 bpm).   Pattern: accelerations present and no decelerations.    Fetal State Assessment: Category I - tracings are normal.     Physical Exam  Nursing note and vitals reviewed. Constitutional: She is oriented to person, place, and  time. She appears well-developed and well-nourished. She appears distressed.  HENT:  Head: Normocephalic.  Eyes: Conjunctivae are normal.  Cardiovascular: Normal rate, regular rhythm and normal heart sounds.   Respiratory: Effort normal and breath sounds normal.  GI: Soft. There is no tenderness.  Genitourinary: Vagina normal and uterus normal. No vaginal discharge found.  Musculoskeletal: She exhibits no edema and no tenderness.  Neurological: She is alert and oriented to person, place, and time. She has normal reflexes.  Skin: Skin is warm and dry.  Psychiatric: She has a normal mood and affect.     Prenatal labs: ABO, Rh: --/--/A POS (09/14 0810) Antibody: NEG (09/14 0810) Rubella: 1.33 (02/17 1453) RPR: NON REAC (06/24 0937)  HBsAg: NEGATIVE (02/17 1453)  HIV: NONREACTIVE (06/24 0454)  GBS: Negative (08/28 0000)   Assessment: 1. Labor: IOL/TOLAC consent under media tab 2. Fetal Wellbeing: Category I  3. Pain Control: IV fentanyl 4. GBS: Neg 5. 40.0 week IUP  Plan:  1. Admit to BS per consult with MD 2. Routine L&D orders 3. Analgesia/anesthesia PRN  4. Foley bulb placed   [place], Abeer Deskins 03/25/2014, 10:38 AM

## 2014-03-25 NOTE — Progress Notes (Signed)
Patient ID: ZYLA DASCENZO, female   DOB: 1986-01-02, 28 y.o.   MRN: [id number] Labor Progress Note  ASSESSMENT:   JENICE LEINER 28 y.o. G2P1001 at [redacted]w[redacted]d in induced labor   PLAN:  1) Labor curve reviewed.       Progress: Latent             Plan: FB placed at 9am. Still in place, therefore will start low dose pitocin 1x 1 given TOLAC  2) Fetal heart tracing reviewed.  Cat I, reassuring  - No intervention indicated  3) GBS Status - Neg GBS  4) Other Problems Active Problems:   Post-dates pregnancy   SUBJECTIVE:  Endorses painful contractions   OBJECTIVE:  Vital Signs:  Patient Vitals for the past 2 hrs:  BP Temp Pulse Resp  03/25/14 2231 108/64 mmHg 98.6 F (37 C) 93 18  03/25/14 2222 123/76 mmHg - 103 18  03/25/14 2135 127/81 mmHg - 96 18   SVE: Dilation: 2, Effacement (%): 60, Station: -3  FHR Monitoring Baseline Rate (A): 145 bpm   Accelerations: 15 x 15 Contraction Frequency (min): 2-6

## 2014-03-25 NOTE — Progress Notes (Signed)
Patient ID: Miranda Bowen, female   DOB: 02-14-1986, 28 y.o.   MRN: [id number] Labor Progress Note  ASSESSMENT:   Miranda Bowen 28 y.o. G2P1001 at [redacted]w[redacted]d in induced labor   PLAN:  1) Labor curve reviewed.       Progress: Latent             Plan: FB placed at 9am. If still in place at next check, will start pitocin at 1x1  2) Fetal heart tracing reviewed.  Cat I, reassuring  - No intervention indicated  3) GBS Status - Neg GBS  4) Other Problems Active Problems:   Post-dates pregnancy   SUBJECTIVE:  Sleeping after nubane and phenegren.    OBJECTIVE:  Vital Signs: Patient Vitals for the past 2 hrs:  BP Pulse Resp  03/25/14 1706 117/87 mmHg 93 18  03/25/14 1600 - - 18   SVE: Dilation: 1, Effacement (%): 60, Station: -3  FHR Monitoring Baseline Rate (A): 130 bpm   Accelerations: 15 x 15 Contraction Frequency (min): 1-6

## 2014-03-26 ENCOUNTER — Encounter (HOSPITAL_COMMUNITY): Payer: Medicaid Other | Admitting: Anesthesiology

## 2014-03-26 ENCOUNTER — Inpatient Hospital Stay (HOSPITAL_COMMUNITY): Payer: Medicaid Other | Admitting: Anesthesiology

## 2014-03-26 ENCOUNTER — Encounter (HOSPITAL_COMMUNITY): Payer: Self-pay

## 2014-03-26 ENCOUNTER — Encounter (HOSPITAL_COMMUNITY)
Admission: RE | Disposition: A | Discharge status: Home / Self Care | Payer: Self-pay | Source: Ambulatory Visit | Attending: Family Medicine

## 2014-03-26 DIAGNOSIS — O34219 Maternal care for unspecified type scar from previous cesarean delivery: Secondary | ICD-10-CM

## 2014-03-26 LAB — CREATININE, SERUM
Creatinine, Ser: 0.9 mg/dL (ref 0.50–1.10)
GFR calc Af Amer: >90 mL/min (ref >90)
GFR calc non Af Amer: 85 mL/min — ABNORMAL LOW (ref >90)

## 2014-03-26 SURGERY — Surgical Case
Anesthesia: Regional

## 2014-03-26 MED ORDER — BUPIVACAINE HCL (PF) 0.5 % IJ SOLN
INTRAMUSCULAR | Status: AC
  Filled 2014-03-26: qty 30

## 2014-03-26 MED ORDER — KETOROLAC TROMETHAMINE 30 MG/ML IJ SOLN
30.0000 mg | Freq: Four times a day (QID) | INTRAMUSCULAR | Status: AC | PRN
  Administered 2014-03-26: 30 mg via INTRAVENOUS

## 2014-03-26 MED ORDER — NALBUPHINE HCL 10 MG/ML IJ SOLN
5.0000 mg | INTRAMUSCULAR | Status: DC | PRN
  Administered 2014-03-27 (×3): 10 mg via SUBCUTANEOUS
  Filled 2014-03-26 (×3): qty 1

## 2014-03-26 MED ORDER — CLINDAMYCIN PHOSPHATE 900 MG/50ML IV SOLN
900.0000 mg | Freq: Three times a day (TID) | INTRAVENOUS | Status: AC
  Administered 2014-03-26 – 2014-03-27 (×3): 900 mg via INTRAVENOUS
  Filled 2014-03-26 (×3): qty 50

## 2014-03-26 MED ORDER — ZOLPIDEM TARTRATE 5 MG PO TABS: 5.0000 mg | ORAL_TABLET | Freq: Every evening | ORAL | Status: DC | PRN

## 2014-03-26 MED ORDER — SIMETHICONE 80 MG PO CHEW: 80.0000 mg | CHEWABLE_TABLET | ORAL | Status: DC | PRN

## 2014-03-26 MED ORDER — SENNOSIDES-DOCUSATE SODIUM 8.6-50 MG PO TABS
2.0000 | ORAL_TABLET | ORAL | Status: DC
  Administered 2014-03-27 – 2014-03-28 (×3): 2 via ORAL
  Filled 2014-03-26 (×3): qty 2

## 2014-03-26 MED ORDER — NALOXONE HCL 1 MG/ML IJ SOLN: 1.0000 ug/kg/h | INTRAVENOUS | Status: DC | PRN

## 2014-03-26 MED ORDER — ONDANSETRON HCL 4 MG/2ML IJ SOLN: 4.0000 mg | INTRAMUSCULAR | Status: DC | PRN

## 2014-03-26 MED ORDER — FENTANYL CITRATE 0.05 MG/ML IJ SOLN
INTRAMUSCULAR | Status: AC
  Filled 2014-03-26: qty 2

## 2014-03-26 MED ORDER — NALBUPHINE HCL 10 MG/ML IJ SOLN: 5.0000 mg | INTRAMUSCULAR | Status: DC | PRN

## 2014-03-26 MED ORDER — FENTANYL 2.5 MCG/ML BUPIVACAINE 1/10 % EPIDURAL INFUSION (WH - ANES)
14.0000 mL/h | INTRAMUSCULAR | Status: DC | PRN
  Administered 2014-03-26 (×3): 14 mL/h via EPIDURAL
  Filled 2014-03-26 (×2): qty 125

## 2014-03-26 MED ORDER — ONDANSETRON HCL 4 MG/2ML IJ SOLN
INTRAMUSCULAR | Status: AC
  Filled 2014-03-26: qty 2

## 2014-03-26 MED ORDER — OXYTOCIN 10 UNIT/ML IJ SOLN
40.0000 [IU] | INTRAVENOUS | Status: DC | PRN
  Administered 2014-03-26: 40 [IU] via INTRAVENOUS

## 2014-03-26 MED ORDER — LACTATED RINGERS IV SOLN: 500.0000 mL | Freq: Once | INTRAVENOUS | Status: DC

## 2014-03-26 MED ORDER — FENTANYL 2.5 MCG/ML BUPIVACAINE 1/10 % EPIDURAL INFUSION (WH - ANES)
INTRAMUSCULAR | Status: AC
  Filled 2014-03-26: qty 125

## 2014-03-26 MED ORDER — IBUPROFEN 600 MG PO TABS
600.0000 mg | ORAL_TABLET | Freq: Four times a day (QID) | ORAL | Status: DC
  Administered 2014-03-27 – 2014-03-29 (×9): 600 mg via ORAL
  Filled 2014-03-26 (×9): qty 1

## 2014-03-26 MED ORDER — SIMETHICONE 80 MG PO CHEW
80.0000 mg | CHEWABLE_TABLET | Freq: Three times a day (TID) | ORAL | Status: DC
  Administered 2014-03-27 – 2014-03-29 (×7): 80 mg via ORAL
  Filled 2014-03-26 (×7): qty 1

## 2014-03-26 MED ORDER — OXYCODONE-ACETAMINOPHEN 5-325 MG PO TABS
1.0000 | ORAL_TABLET | ORAL | Status: DC | PRN
  Administered 2014-03-28 – 2014-03-29 (×3): 1 via ORAL
  Filled 2014-03-26 (×4): qty 1

## 2014-03-26 MED ORDER — CEFAZOLIN SODIUM-DEXTROSE 2-3 GM-% IV SOLR
INTRAVENOUS | Status: AC
  Filled 2014-03-26: qty 50

## 2014-03-26 MED ORDER — DIPHENHYDRAMINE HCL 50 MG/ML IJ SOLN: 12.5000 mg | INTRAMUSCULAR | Status: DC | PRN

## 2014-03-26 MED ORDER — PHENYLEPHRINE 40 MCG/ML (10ML) SYRINGE FOR IV PUSH (FOR BLOOD PRESSURE SUPPORT): 80.0000 ug | PREFILLED_SYRINGE | INTRAVENOUS | Status: DC | PRN

## 2014-03-26 MED ORDER — KETOROLAC TROMETHAMINE 30 MG/ML IJ SOLN
INTRAMUSCULAR | Status: AC
  Filled 2014-03-26: qty 1

## 2014-03-26 MED ORDER — ONDANSETRON HCL 4 MG/2ML IJ SOLN: 4.0000 mg | Freq: Three times a day (TID) | INTRAMUSCULAR | Status: DC | PRN

## 2014-03-26 MED ORDER — FENTANYL CITRATE 0.05 MG/ML IJ SOLN
INTRAMUSCULAR | Status: DC | PRN
  Administered 2014-03-26: 50 ug via INTRAVENOUS

## 2014-03-26 MED ORDER — FENTANYL CITRATE 0.05 MG/ML IJ SOLN
25.0000 ug | INTRAMUSCULAR | Status: DC | PRN
  Administered 2014-03-26 (×2): 50 ug via INTRAVENOUS

## 2014-03-26 MED ORDER — SODIUM BICARBONATE 8.4 % IV SOLN
INTRAVENOUS | Status: DC | PRN
  Administered 2014-03-26 (×3): 5 mL via EPIDURAL

## 2014-03-26 MED ORDER — ONDANSETRON HCL 4 MG PO TABS: 4.0000 mg | ORAL_TABLET | ORAL | Status: DC | PRN

## 2014-03-26 MED ORDER — DIBUCAINE 1 % RE OINT: 1.0000 "application " | TOPICAL_OINTMENT | RECTAL | Status: DC | PRN

## 2014-03-26 MED ORDER — WITCH HAZEL-GLYCERIN EX PADS: 1.0000 "application " | MEDICATED_PAD | CUTANEOUS | Status: DC | PRN

## 2014-03-26 MED ORDER — DIPHENHYDRAMINE HCL 25 MG PO CAPS: 25.0000 mg | ORAL_CAPSULE | Freq: Four times a day (QID) | ORAL | Status: DC | PRN

## 2014-03-26 MED ORDER — NALOXONE HCL 0.4 MG/ML IJ SOLN: 0.4000 mg | INTRAMUSCULAR | Status: DC | PRN

## 2014-03-26 MED ORDER — OXYTOCIN 40 UNITS IN LACTATED RINGERS INFUSION - SIMPLE MED: 62.5000 mL/h | INTRAVENOUS | Status: AC

## 2014-03-26 MED ORDER — BUPIVACAINE HCL (PF) 0.5 % IJ SOLN
INTRAMUSCULAR | Status: DC | PRN
  Administered 2014-03-26: 30 mL

## 2014-03-26 MED ORDER — SODIUM CHLORIDE 0.9 % IV SOLN
2.0000 g | Freq: Four times a day (QID) | INTRAVENOUS | Status: DC
  Administered 2014-03-26 – 2014-03-27 (×6): 2 g via INTRAVENOUS
  Filled 2014-03-26 (×7): qty 2000

## 2014-03-26 MED ORDER — TETANUS-DIPHTH-ACELL PERTUSSIS 5-2.5-18.5 LF-MCG/0.5 IM SUSP: 0.5000 mL | Freq: Once | INTRAMUSCULAR | Status: DC

## 2014-03-26 MED ORDER — SCOPOLAMINE 1 MG/3DAYS TD PT72
1.0000 | MEDICATED_PATCH | Freq: Once | TRANSDERMAL | Status: DC
  Administered 2014-03-26: 1.5 mg via TRANSDERMAL

## 2014-03-26 MED ORDER — NITROGLYCERIN 0.4 MG/SPRAY TL SOLN
Status: DC | PRN
  Administered 2014-03-26: 3 via SUBLINGUAL

## 2014-03-26 MED ORDER — GENTAMICIN SULFATE 40 MG/ML IJ SOLN
160.0000 mg | Freq: Three times a day (TID) | INTRAVENOUS | Status: AC
  Administered 2014-03-26 – 2014-03-27 (×6): 160 mg via INTRAVENOUS
  Filled 2014-03-26 (×6): qty 4

## 2014-03-26 MED ORDER — PHENYLEPHRINE 40 MCG/ML (10ML) SYRINGE FOR IV PUSH (FOR BLOOD PRESSURE SUPPORT)
PREFILLED_SYRINGE | INTRAVENOUS | Status: AC
  Filled 2014-03-26: qty 10

## 2014-03-26 MED ORDER — BUPIVACAINE HCL (PF) 0.25 % IJ SOLN
INTRAMUSCULAR | Status: AC
  Filled 2014-03-26: qty 30

## 2014-03-26 MED ORDER — MORPHINE SULFATE 0.5 MG/ML IJ SOLN
INTRAMUSCULAR | Status: AC
  Filled 2014-03-26: qty 10

## 2014-03-26 MED ORDER — PHENYLEPHRINE 8 MG IN D5W 100 ML (0.08MG/ML) PREMIX OPTIME
INJECTION | INTRAVENOUS | Status: AC
  Filled 2014-03-26: qty 100

## 2014-03-26 MED ORDER — LACTATED RINGERS IV SOLN
INTRAVENOUS | Status: DC
  Administered 2014-03-27 (×2): via INTRAVENOUS

## 2014-03-26 MED ORDER — ONDANSETRON HCL 4 MG/2ML IJ SOLN
INTRAMUSCULAR | Status: DC | PRN
  Administered 2014-03-26: 4 mg via INTRAVENOUS

## 2014-03-26 MED ORDER — LACTATED RINGERS IV SOLN
INTRAVENOUS | Status: DC | PRN
  Administered 2014-03-26 (×3): via INTRAVENOUS

## 2014-03-26 MED ORDER — KETOROLAC TROMETHAMINE 30 MG/ML IJ SOLN: 30.0000 mg | Freq: Four times a day (QID) | INTRAMUSCULAR | Status: AC | PRN

## 2014-03-26 MED ORDER — MEPERIDINE HCL 25 MG/ML IJ SOLN
INTRAMUSCULAR | Status: AC
  Filled 2014-03-26: qty 1

## 2014-03-26 MED ORDER — MENTHOL 3 MG MT LOZG: 1.0000 | LOZENGE | OROMUCOSAL | Status: DC | PRN

## 2014-03-26 MED ORDER — SODIUM CHLORIDE 0.9 % IJ SOLN: 3.0000 mL | INTRAMUSCULAR | Status: DC | PRN

## 2014-03-26 MED ORDER — LANOLIN HYDROUS EX OINT: 1.0000 "application " | TOPICAL_OINTMENT | CUTANEOUS | Status: DC | PRN

## 2014-03-26 MED ORDER — OXYCODONE-ACETAMINOPHEN 5-325 MG PO TABS
2.0000 | ORAL_TABLET | ORAL | Status: DC | PRN
  Administered 2014-03-27: 2 via ORAL
  Filled 2014-03-26: qty 2

## 2014-03-26 MED ORDER — DIPHENHYDRAMINE HCL 50 MG/ML IJ SOLN: 25.0000 mg | INTRAMUSCULAR | Status: DC | PRN

## 2014-03-26 MED ORDER — DIPHENHYDRAMINE HCL 25 MG PO CAPS: 25.0000 mg | ORAL_CAPSULE | ORAL | Status: DC | PRN

## 2014-03-26 MED ORDER — NITROGLYCERIN 0.4 MG/SPRAY TL SOLN
Status: AC
  Filled 2014-03-26: qty 4.9

## 2014-03-26 MED ORDER — MEPERIDINE HCL 25 MG/ML IJ SOLN
6.2500 mg | INTRAMUSCULAR | Status: DC | PRN
  Administered 2014-03-26: 6.25 mg via INTRAVENOUS

## 2014-03-26 MED ORDER — LACTATED RINGERS IV SOLN
INTRAVENOUS | Status: DC | PRN
  Administered 2014-03-26: 19:00:00 via INTRAVENOUS

## 2014-03-26 MED ORDER — EPHEDRINE 5 MG/ML INJ: 10.0000 mg | INTRAVENOUS | Status: DC | PRN

## 2014-03-26 MED ORDER — SCOPOLAMINE 1 MG/3DAYS TD PT72
MEDICATED_PATCH | TRANSDERMAL | Status: AC
Start: 2014-03-26 — End: 2014-03-27
  Filled 2014-03-26: qty 1

## 2014-03-26 MED ORDER — MORPHINE SULFATE (PF) 0.5 MG/ML IJ SOLN
INTRAMUSCULAR | Status: DC | PRN
  Administered 2014-03-26: 4 mg via EPIDURAL
  Administered 2014-03-26: 1 mg via INTRAVENOUS

## 2014-03-26 MED ORDER — OXYTOCIN 10 UNIT/ML IJ SOLN
INTRAMUSCULAR | Status: AC
  Filled 2014-03-26: qty 4

## 2014-03-26 MED ORDER — SIMETHICONE 80 MG PO CHEW
80.0000 mg | CHEWABLE_TABLET | ORAL | Status: DC
  Administered 2014-03-27 – 2014-03-28 (×3): 80 mg via ORAL
  Filled 2014-03-26 (×3): qty 1

## 2014-03-26 MED ORDER — PRENATAL MULTIVITAMIN CH
1.0000 | ORAL_TABLET | Freq: Every day | ORAL | Status: DC
  Administered 2014-03-27 – 2014-03-28 (×2): 1 via ORAL
  Filled 2014-03-26 (×2): qty 1

## 2014-03-26 MED ORDER — METOCLOPRAMIDE HCL 5 MG/ML IJ SOLN: 10.0000 mg | Freq: Three times a day (TID) | INTRAMUSCULAR | Status: DC | PRN

## 2014-03-26 MED ORDER — CEFAZOLIN SODIUM-DEXTROSE 2-3 GM-% IV SOLR
INTRAVENOUS | Status: DC | PRN
  Administered 2014-03-26: 2 g via INTRAVENOUS

## 2014-03-26 MED ORDER — ACETAMINOPHEN 325 MG PO TABS
650.0000 mg | ORAL_TABLET | ORAL | Status: DC
  Administered 2014-03-26 (×2): 650 mg via ORAL
  Filled 2014-03-26 (×2): qty 2

## 2014-03-26 MED ORDER — PHENYLEPHRINE 40 MCG/ML (10ML) SYRINGE FOR IV PUSH (FOR BLOOD PRESSURE SUPPORT)
PREFILLED_SYRINGE | INTRAVENOUS | Status: AC
  Filled 2014-03-26: qty 5

## 2014-03-26 SURGICAL SUPPLY — 39 items
BARRIER ADHS 3X4 INTERCEED (GAUZE/BANDAGES/DRESSINGS) IMPLANT
BENZOIN TINCTURE PRP APPL 2/3 (GAUZE/BANDAGES/DRESSINGS) ×3 IMPLANT
BLADE SURG 10 STRL SS (BLADE) ×6 IMPLANT
CLAMP CORD UMBIL (MISCELLANEOUS) IMPLANT
CLOSURE STERI STRIP 1/2 X4 (GAUZE/BANDAGES/DRESSINGS) ×3 IMPLANT
CLOTH BEACON ORANGE TIMEOUT ST (SAFETY) ×3 IMPLANT
CONTAINER PREFILL 10% NBF 15ML (MISCELLANEOUS) IMPLANT
DRAPE LG THREE QUARTER DISP (DRAPES) IMPLANT
DRSG OPSITE POSTOP 4X10 (GAUZE/BANDAGES/DRESSINGS) ×3 IMPLANT
DRSG PAD ABDOMINAL 8X10 ST (GAUZE/BANDAGES/DRESSINGS) ×3 IMPLANT
DURAPREP 26ML APPLICATOR (WOUND CARE) ×3 IMPLANT
ELECT REM PT RETURN 9FT ADLT (ELECTROSURGICAL) ×3
ELECTRODE REM PT RTRN 9FT ADLT (ELECTROSURGICAL) ×1 IMPLANT
EXTRACTOR VACUUM KIWI (MISCELLANEOUS) IMPLANT
GLOVE BIO SURGEON STRL SZ 6.5 (GLOVE) ×2 IMPLANT
GLOVE BIO SURGEONS STRL SZ 6.5 (GLOVE) ×1
GOWN STRL REUS W/TWL LRG LVL3 (GOWN DISPOSABLE) ×6 IMPLANT
KIT ABG SYR 3ML LUER SLIP (SYRINGE) IMPLANT
NEEDLE HYPO 25X5/8 SAFETYGLIDE (NEEDLE) IMPLANT
NEEDLE SPNL 18GX3.5 QUINCKE PK (NEEDLE) ×3 IMPLANT
NS IRRIG 1000ML POUR BTL (IV SOLUTION) ×3 IMPLANT
PACK C SECTION WH (CUSTOM PROCEDURE TRAY) ×3 IMPLANT
PAD OB MATERNITY 4.3X12.25 (PERSONAL CARE ITEMS) ×3 IMPLANT
SUT PDS AB 0 CTX 60 (SUTURE) ×3 IMPLANT
SUT VIC AB 0 CT1 27 (SUTURE)
SUT VIC AB 0 CT1 27XBRD ANBCTR (SUTURE) IMPLANT
SUT VIC AB 0 CT1 36 (SUTURE) IMPLANT
SUT VIC AB 2-0 CT1 27 (SUTURE) ×2
SUT VIC AB 2-0 CT1 TAPERPNT 27 (SUTURE) ×1 IMPLANT
SUT VIC AB 2-0 CTX 36 (SUTURE) ×9 IMPLANT
SUT VIC AB 3-0 CT1 27 (SUTURE) ×4
SUT VIC AB 3-0 CT1 TAPERPNT 27 (SUTURE) ×2 IMPLANT
SUT VIC AB 3-0 SH 27 (SUTURE)
SUT VIC AB 3-0 SH 27X BRD (SUTURE) IMPLANT
SYR 30ML LL (SYRINGE) ×3 IMPLANT
TAPE CLOTH SURG 4X10 WHT LF (GAUZE/BANDAGES/DRESSINGS) ×3 IMPLANT
TOWEL OR 17X24 6PK STRL BLUE (TOWEL DISPOSABLE) ×3 IMPLANT
TRAY FOLEY CATH 14FR (SET/KITS/TRAYS/PACK) ×3 IMPLANT
WATER STERILE IRR 1000ML POUR (IV SOLUTION) ×3 IMPLANT

## 2014-03-26 NOTE — Progress Notes (Signed)
ANTIBIOTIC CONSULT NOTE - INITIAL  Pharmacy Consult for gentamicin Indication: maternal fever - R/O chorioamnionitis  No Known Allergies  Patient Measurements: Height:  (170.2 cm) Weight: 180 lb (81.647 kg) IBW/kg (Calculated) : 61.6 Adjusted Body Weight: 68 kg  Vital Signs: Temp: 100.9 F (38.3 C) (09/14 2338) Temp src: Axillary (09/14 2338) BP: 118/71 mmHg (09/14 2338) Pulse Rate: 90 (09/14 2338) Intake/Output from previous day:   Intake/Output from this shift:    Labs:  Recent Labs  03/25/14 0810  WBC 10.3  HGB 10.5*  PLT 324   Est CrCl for age & pregnancy should be approx 110-138ml/min.    No Scr done yet to verify assumption.    Microbiology: Recent Results (from the past 720 hour(s))  OB RESULTS CONSOLE GBS     Status: None   Collection Time    03/08/14 12:00 AM      Result Value Ref Range Status   GBS Negative   Final  GC/CHLAMYDIA PROBE AMP     Status: None   Collection Time    03/08/14 11:14 AM      Result Value Ref Range Status   CT Probe RNA NEGATIVE   Final   GC Probe RNA NEGATIVE   Final   Comment:                                                                                             **Normal Reference Range: Negative**                 Assay performed using the Gen-Probe APTIMA COMBO2 (R) Assay.           Acceptable specimen types for this assay include APTIMA Swabs (Unisex,     endocervical, urethral, or vaginal), first void urine, and ThinPrep     liquid based cytology samples.  CULTURE, BETA STREP (GROUP B ONLY)     Status: None   Collection Time    03/08/14 11:14 AM      Result Value Ref Range Status   Organism ID, Bacteria NO GROUP B STREP ([email or web address]) ISOLATED   Final    Medical History: Past Medical History  Diagnosis Date  . No pertinent past medical history   . Medical history non-contributory     Medications:  Scheduled:  . ampicillin (OMNIPEN) IV  2 g Intravenous 4 times per day  . gentamicin  160 mg  Intravenous Q8H   Infusions:  . lactated ringers 125 mL/hr at 03/25/14 1510  . oxytocin 40 units in LR 1000 mL    . oxytocin 40 units in LR 1000 mL    . oxytocin 40 units in LR 1000 mL 4 milli-units/min (03/25/14 2222)   PRN: acetaminophen, citric acid-sodium citrate, fentaNYL, lactated ringers, lidocaine (PF), nalbuphine, ondansetron, promethazine  Assessment: 43yr female G2P1 term pregnancy now with maternal fever - R/O chorioamnionitis.  To be treated with ampicillin and gentamicin.  Goal of Therapy:  Desire peak gentamicin serum level to be 6-70mcg/ml and trough serum level to be <75mcg/ml.  Plan:  1.  Gentamicin  IV q8h - first dose now. 2.  If  maintenance regimen started, will get SCr in late morning to confirm estimated CrCl. 3.  If therapy continues >48hr, will get actual serum gentamicin steady state levels as clinically indicated.  Shirley Muscat E 03/26/2014,12:45 AM

## 2014-03-26 NOTE — Progress Notes (Signed)
Patient ID: Miranda Bowen, female   DOB: 10/16/1985, 28 y.o.   MRN: [id number] Miranda Bowen is a 28 y.o. G2P1001 at [redacted]w[redacted]d.  Subjective: Mild rectal pressure w/ UC's  Objective: BP 121/89  Pulse 91  Temp(Src) 99 F (37.2 C) (Oral)  Resp 20  Ht  (1.702 m)  Wt 81.647 kg (180 lb)  BMI 28.19 kg/m2  SpO2 100%  LMP 06/06/2013   FHT:  FHR: 140 bpm, variability: moderate,  accelerations:  15x15,  decelerations:  Few lates, resolved UC:   Q 2-4, minutes, MVUs 150-320  Dilation: 5.5 Effacement (%): 90 Cervical Position: Posterior Station: -1;0 Presentation: Vertex Molding  Exam by:: Ivonne Andrew, CNM  Labs: Results for orders placed during the hospital encounter of 03/25/14 (from the past 24 hour(s))  CREATININE, SERUM     Status: Abnormal   Collection Time    03/26/14  1:51 PM      Result Value Ref Range   Creatinine, Ser 0.90  0.50 - 1.10 mg/dL   GFR calc non Af Amer 85 (*) >90 mL/min   GFR calc Af Amer >90  >90 mL/min    Assessment / Plan: [redacted]w[redacted]d week IUP Labor: Active Fetal Wellbeing:  Category I-II Pain Control:  Epidural Anticipated MOD:  C/S Minimal labor progress despite adequate MVUs. Also concerned about continuing TOLAC w/ presumptive chorio. Recommend C/S. Dr. Marice Potter notified. Discussed risk of infection, bleeding and damage to nearby organs. Pt gives verbal and written consent.   Prep OR  [place], CNM 03/26/2014 6:26 PM

## 2014-03-26 NOTE — Anesthesia Postprocedure Evaluation (Signed)
  Anesthesia Post-op Note  Patient: Miranda Bowen  Procedure(s) Performed: Procedure(s): CESAREAN SECTION (N/A)  Patient Location: PACU  Anesthesia Type:Epidural  Level of Consciousness: awake and alert   Airway and Oxygen Therapy: Patient Spontanous Breathing  Post-op Pain: none  Post-op Assessment: Post-op Vital signs reviewed, Patient's Cardiovascular Status Stable, Respiratory Function Stable, Patent Airway, No signs of Nausea or vomiting and Pain level controlled  Post-op Vital Signs: Reviewed and stable  Last Vitals:  Filed Vitals:   03/26/14 2133  BP: 134/81  Pulse: 105  Temp: 37.4 C  Resp: 18    Complications: No apparent anesthesia complications

## 2014-03-26 NOTE — Progress Notes (Signed)
Miranda Bowen is a 28 y.o. G2P1001 at [redacted]w[redacted]d.  Subjective: Feeling contractions. Epidural was just re-dosed but not yet effective.   Objective: BP 120/84  Pulse 78  Temp(Src) 99 F (37.2 C) (Oral)  Resp 20  Ht  (1.702 m)  Wt 81.647 kg (180 lb)  BMI 28.19 kg/m2  SpO2 100%  LMP 06/06/2013   FHT:  FHR: 130 bpm, variability: mod,  accelerations:  good,  decelerations:  none UC:   Q 2-15minutes Dilation: 5.5 Effacement (%): 80 Cervical Position: Posterior Station: -1;0 Presentation: Vertex Exam by:: Ivonne Andrew, CNM  Labs: Results for orders placed during the hospital encounter of 03/25/14 (from the past 24 hour(s))  CREATININE, SERUM     Status: Abnormal   Collection Time    03/26/14  1:51 PM      Result Value Ref Range   Creatinine, Ser 0.90  0.50 - 1.10 mg/dL   GFR calc non Af Amer 85 (*) >90 mL/min   GFR calc Af Amer >90  >90 mL/min    Assessment / Plan: [redacted]w[redacted]d week IUP Labor: Progressing slowly at 12 milli units/min. IUPC placed to assess quality of contractions Fetal Wellbeing:  Category 1 Pain Control:  Epidural in place Anticipated MOD:  NSVD  Rodrigo Ran, MD Family Medicine, PGY-1

## 2014-03-26 NOTE — Progress Notes (Signed)
Patient ID: Miranda Bowen, female   DOB: 10-Sep-1985, 28 y.o.   MRN: [id number] Miranda Bowen is a 28 y.o. G2P1001 at [redacted]w[redacted]d.  Subjective: Comfortable w/ epidural  Objective: BP 137/83  Pulse 120  Temp(Src) 98.6 F (37 C) (Oral)  Resp 20  Ht  (1.702 m)  Wt 81.647 kg (180 lb)  BMI 28.19 kg/m2  SpO2 100%  LMP 06/06/2013   FHT:  FHR: 130 bpm, variability: mod,  accelerations:  15x15,  decelerations:  none UC:   Q 2-53minutes, strong  Dilation: 4.5 Effacement (%): 70 Cervical Position: Posterior Station: 0 Presentation: Vertex Exam by:: Ivonne Andrew, CNM  Labs: No results found for this or any previous visit (from the past 24 hour(s)).  Assessment / Plan: [redacted]w[redacted]d week IUP Labor: Early Fetal Wellbeing:  Category I Pain Control:  Epidural Anticipated MOD:  NSVD Presumptive chorio. Temp, FHR stable  [place], [place] 03/26/2014 10:18

## 2014-03-26 NOTE — Transfer of Care (Signed)
Immediate Anesthesia Transfer of Care Note  Patient: Miranda Bowen  Procedure(s) Performed: Procedure(s): CESAREAN SECTION (N/A)  Patient Location: PACU  Anesthesia Type:Epidural  Level of Consciousness: awake, alert  and oriented  Airway & Oxygen Therapy: Patient Spontanous Breathing  Post-op Assessment: Report given to PACU RN and Post -op Vital signs reviewed and stable  Post vital signs: Reviewed and stable  Complications: No apparent anesthesia complications

## 2014-03-26 NOTE — Anesthesia Procedure Notes (Signed)
Epidural Patient location during procedure: OB Start time: 03/26/2014 2:02 AM End time: 03/26/2014 2:14 AM  Staffing Anesthesiologist: Jeslyn Amsler, CHRIS Performed by: anesthesiologist   Preanesthetic Checklist Completed: patient identified, surgical consent, pre-op evaluation, timeout performed, IV checked, risks and benefits discussed and monitors and equipment checked  Epidural Patient position: sitting Prep: site prepped and draped and DuraPrep Patient monitoring: heart rate, cardiac monitor, continuous pulse ox and blood pressure Approach: midline Location: L4-L5 Injection technique: LOR saline  Needle:  Needle type: Tuohy  Needle gauge: 17 G Needle length: 9 cm Needle insertion depth: 6 cm Catheter type: closed end flexible Catheter size: 19 Gauge Catheter at skin depth: 12 cm Test dose: Other  Assessment Events: blood not aspirated, injection not painful, no injection resistance, negative IV test and no paresthesia  Additional Notes H+P and labs checked, risks and benefits discussed with the patient, consent obtained, procedure tolerated well and without complications.  Reason for block:procedure for pain

## 2014-03-26 NOTE — Anesthesia Preprocedure Evaluation (Signed)
Anesthesia Evaluation  Patient identified by MRN, date of birth, ID band Patient awake    Reviewed: Allergy & Precautions, H&P , NPO status , Patient's Chart, lab work & pertinent test results  History of Anesthesia Complications Negative for: history of anesthetic complications  Airway Mallampati: I TM Distance: >3 FB Neck ROM: Full    Dental  (+) Teeth Intact   Pulmonary neg pulmonary ROS,          Cardiovascular negative cardio ROS      Neuro/Psych negative neurological ROS     GI/Hepatic negative GI ROS, Neg liver ROS,   Endo/Other  negative endocrine ROS  Renal/GU negative Renal ROS     Musculoskeletal   Abdominal   Peds  Hematology negative hematology ROS (+)   Anesthesia Other Findings   Reproductive/Obstetrics (+) Pregnancy                           Anesthesia Physical Anesthesia Plan  ASA: II  Anesthesia Plan: Epidural   Post-op Pain Management:    Induction:   Airway Management Planned:   Additional Equipment:   Intra-op Plan:   Post-operative Plan:   Informed Consent: I have reviewed the patients History and Physical, chart, labs and discussed the procedure including the risks, benefits and alternatives for the proposed anesthesia with the patient or authorized representative who has indicated his/her understanding and acceptance.     Plan Discussed with: Anesthesiologist  Anesthesia Plan Comments:         Anesthesia Quick Evaluation

## 2014-03-26 NOTE — Op Note (Signed)
03/25/2014 - 03/26/2014  7:30 PM  PATIENT:  Miranda Bowen  28 y.o. female  PRE-OPERATIVE DIAGNOSIS:  Failed IOL for TOLAC, chorioamnionitis  POST-OPERATIVE DIAGNOSIS: same  PROCEDURE:  Procedure(s): CESAREAN SECTION (N/A), REPEAT  SURGEON:  Surgeon(s) and Role:    * Allie Bossier, MD - Primary  FINDINGS: Living female infant with Apgars of 9&9, intact placenta with 3 vessel cord, normal adnexa  ASSISTANTS: none   ANESTHESIA:   epidural  EBL:  Total I/O In: 1000 [I.V.:1000] Out: 1100 [Urine:200; Blood:900]  BLOOD ADMINISTERED:none  DRAINS: none   LOCAL MEDICATIONS USED:  MARCAINE     SPECIMEN:  Source of Specimen:  placenta, cord blood  DISPOSITION OF SPECIMEN:  PATHOLOGY  COUNTS:  YES  TOURNIQUET:  * No tourniquets in log *  DICTATION: .Dragon Dictation  PLAN OF CARE: Admit to inpatient   PATIENT DISPOSITION:  PACU - hemodynamically stable.   Delay start of Pharmacological VTE agent (>24hrs) due to surgical blood loss or risk of bleeding: not applicable  The risks, benefits, and alternatives of surgery were explained, understood, accepted. Consents were signed. All questions were answered. In the operating room epidural anesthesia was bolused for surgery. Her abdomen and vagina were prepped and draped in the usual sterile fashion. A Foley catheter was placed, draining clear urine throughout case. Timeout procedure was done. After adequate anesthesia was assured 30 mL for 0.5% Marcaine was injected into the subcutaneous tissue at the site of her previous cesarean. An incision was made through the previous incision. The incision was carried down through the subcutaneous tissue to the fascia. The fascia was scored the midline and extended bilaterally. The middle 50% of the rectus muscles were separated in a transverse fashion using electrosurgical technique. Excellent hemostasis was maintained. The peritoneum was entered with hemostats. Peritoneal incision was extended  bilaterally with the Bovie. The bladder blade was placed. A transverse incision was made on the well-developed lower uterine segment. The uterine incision was extended with traction on each side. Amniotomy was performed with a hemostat. Clear fluid was noted. The baby was delivered from an OP presentation. The mouth and nostrils were suctioned prior to delivery of the shoulders. The baby's cord was clamped and cut and was transferred to the NICU personnel for routine care. The placenta was delivered intact with traction  The uterus was left in situ and the interior was cleaned with a dry lap sponge. The uterine incision was closed with 2-0 Vicryl running locking suture in 2 layers, the second layer imbricating the first . Excellent hemostasis was noted. By tilting the uterus each side was able to visualize the adnexa, and they were normal. The rectus fascia rectus muscles were noted be hemostatic as well. The fascia was closed with a #1 PDS loop in a running nonlocking fashion. No defects were palpable. The subcutaneous tissue was irrigated, clean, and dried. A subcuticular closure was done with a 3-0 Vicryl suture. Excellent cosmetic results were obtained. She was taken to the recovery room in stable condition. She tolerated the procedure well.

## 2014-03-26 NOTE — Progress Notes (Signed)
Patient ID: NALIYAH NETH, female   DOB: 04/10/1986, 28 y.o.   MRN: [id number]  Miranda Bowen is a 28 y.o. G2P1001 at [redacted]w[redacted]d.  Subjective: Comfortable w/ epidural  Objective: BP 129/90  Pulse 97  Temp(Src) 99 F (37.2 C) (Oral)  Resp 18  Ht  (1.702 m)  Wt 81.647 kg (180 lb)  BMI 28.19 kg/m2  SpO2 100%  LMP 06/06/2013   FHT:  FHR: 163m, variability: min-mod accelerations:  15x15 decelerations:  Mild variables UC:   Q 2-5 minutes, strong Pitocin in 10  Dilation: 4.5 Effacement (%): 80 Cervical Position: Posterior Station: 0 Presentation: Vertex Exam by:: Raliegh Ip RN  Labs: Results for orders placed during the hospital encounter of 03/25/14 (from the past 24 hour(s))  CREATININE, SERUM     Status: Abnormal   Collection Time    03/26/14  1:51 PM      Result Value Ref Range   Creatinine, Ser 0.90  0.50 - 1.10 mg/dL   GFR calc non Af Amer 85 (*) >90 mL/min   GFR calc Af Amer >90  >90 mL/min    Assessment / Plan: [redacted]w[redacted]d week IUP Presumptive chorio, stable Labor: Early Fetal Wellbeing:  Category I-II Pain Control:  Epidural Anticipated MOD:  NSVD IUPC if no change at next check.    [place], CNM 03/26/2014 3:01 PM

## 2014-03-26 NOTE — Progress Notes (Signed)
Miranda Bowen is a 28 y.o. G2P1001 at [redacted]w[redacted]d by ultrasound admitted for induction of labor. Subjective:  Pt resting comfortably in bed but reports increased pain due to contractions. Pt with low grade temp. Foley bulb unable to removed due to persistent tension on category.  Objective: BP 118/71  Pulse 90  Temp(Src) 100.9 F (38.3 C) (Axillary)  Resp 18  Ht  (1.702 m)  Wt 81.647 kg (180 lb)  BMI 28.19 kg/m2  LMP 06/06/2013      FHT:  FHR: 175 bpm, variability: moderate,  accelerations:  Present,  decelerations:  Present variable-occassional late UC:   irregular, every 3-4 minutes SVE:   Dilation: 4 Effacement (%): 60 Station: -3 Exam by:: Karie Mainland, MD   Labs: Lab Results  Component Value Date   WBC 10.3 03/25/2014   HGB 10.5* 03/25/2014   HCT 32.9* 03/25/2014   MCV 85.7 03/25/2014   PLT 324 03/25/2014    Assessment / Plan: Induction of labor  Labor: Progressing on Pitocin, will continue to increase then AROM as FHTs tolerate Preeclampsia:  no signs or symptoms of toxicity Fetal Wellbeing:  Category II, variable and late decels have now resolved with repositioning. Overall variability with presence of accelerations is reassuring. Will consider to need to place internal monitors if necessary. Pain Control:  Labor support without medications I/D:  start ampicillin and gentamicin for chorioamnionitis Anticipated MOD:  NSVD  Tomma Rakers, MD, PGY3 03/26/2014, 12:42 AM

## 2014-03-27 ENCOUNTER — Encounter (HOSPITAL_COMMUNITY): Payer: Self-pay | Admitting: Obstetrics & Gynecology

## 2014-03-27 LAB — CBC
HCT: 26.3 % — ABNORMAL LOW (ref 36.0–46.0)
Hemoglobin: 8.4 g/dL — ABNORMAL LOW (ref 12.0–15.0)
MCH: 27.5 pg (ref 26.0–34.0)
MCHC: 31.9 g/dL (ref 30.0–36.0)
MCV: 85.9 fL (ref 78.0–100.0)
Platelets: 278 10*3/uL (ref 150–400)
RBC: 3.06 MIL/uL — ABNORMAL LOW (ref 3.87–5.11)
RDW: 14.9 % (ref 11.5–15.5)
WBC: 19 10*3/uL — ABNORMAL HIGH (ref 4.0–10.5)

## 2014-03-27 NOTE — Progress Notes (Signed)
UR chart review completed.  

## 2014-03-27 NOTE — Progress Notes (Signed)
Clinical Social Work Department PSYCHOSOCIAL ASSESSMENT - MATERNAL/CHILD 03/27/2014  Patient:  Miranda Bowen,Miranda Bowen  Account Number:  [id number]  Admit Date:  03/25/2014  Childs Name:   [name]   Clinical Social Worker:  Javanna Patin, CLINICAL SOCIAL WORKER   Date/Time:  03/27/2014 11:45 AM  Date Referred:  03/26/2014   Referral source  Central Nursery     Referred reason  Other - See comment   Other referral source:   MOB and FOB ended their relationship 1 month ago, and it had been noted that MOB had been tearful during prenatal appointments.    I:  FAMILY / HOME ENVIRONMENT Child's legal guardian:  PARENT  Guardian - Name Guardian - Age Guardian - Address  Miranda Bowen 28 [address] [place], [place] [address]  [place]  other residence   Other household support members/support persons Name Relationship DOB  Quinton SON 9 years old   Other support:   MOB stated that her sisters and her mother are supportive and actively involved in her life.    II  PSYCHOSOCIAL DATA Information Source:  Patient Interview  Financial and Community Resources Employment:   MOB stated that she works at [employer] day[employer].   Financial resources:  Medicaid If Medicaid - County:  [place] Other  WIC  Food Stamps   School / Grade:  N/A Maternity Care Coordinator / Child Services Coordination / Early Interventions:   CSW to make CC4C referral.  Cultural issues impacting care:   None reported    III  STRENGTHS Strengths  Adequate Resources  Supportive family/friends  Home prepared for Child (including basic supplies)   Strength comment:    IV  RISK FACTORS AND CURRENT PROBLEMS Current Problem:  YES   Risk Factor & Current Problem Patient Issue Family Issue Risk Factor / Current Problem Comment  Family/Relationship Issues Y N MOB and FOB ended their relationship 1 month ago.  MOB noted that FOB was "different and distant" when she was 7 months pregnant.  She stated that their  relationship ended 1 month ago, and he is currently with a woman who is calling herself the "stepmother" of their newborn.  MOB expressed ongoing sadness related to the loss of this relationship.    V  SOCIAL WORK ASSESSMENT CSW met with the MOB in her room in order to complete the assessment. Consult was ordered due to MOB and FOB ending their relationship 1 month ago, and MOB presenting as sad and tearful during prenatal appointments.  MOB was easily engaged in the assessment and was receptive to discussing the reason for the consult; however, she minimized her feelings of sadness with the CSW and stated that she is "coping well".  MOB processed minimally with CSW, but was receptive to receiving referrals for outpatient therapy.  MOB displayed appropriate range in affect and presented to be attentive to the baby and in pleasant mood.   MOB shared that she feels supported by her sisters and her mother as she transitions into the postpartum period.  She stated that she will be living alone with her 9 old son and now her newborn baby.  MOB expressed normative anxiety as she prepares to have a newborn again since it has been 9 years since she has had a baby in the home.  She stated that she is also concerned about how her 30 year old will react to the change since he is used to receiving all of her attention.  CSW validated MOB's anxieties and   discussed normative behaviors that her son may experience as he adjusts to having a sibling in the home.   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   MOB stated that she believes the FOB will be actively involved in the baby's life since he has another child with whom he helps provide care for.   CSW inquired about her recent mood as it had been noted that she had been more tearful during prenatal visits.  MOB began to process with CSW the recent break-up from the FOB.  She shared belief that she is  "coping well", but CSW shared impression with the MOB that the tone of her voice indicates potential anger and ongoing hurt.  MOB stated that she is still hurt because the FOB was unfaithful during their relationship.  She shared that he is already in a significant relationship with another woman and this woman is referring herself to the baby's "stepmother".  CSW validated her feelings and normalized ongoing feeling of hurt and anger since she is grieving the loss of a significant relationship.  MOB stated that she does not intend to cry in front of her children since she does not want to expose them to her sadness.  CSW normalized MOB's efforts to try to protect her children, but also emphasized the importance of her expressing her feelings versus internalization.  CSW assisted MOB to look forward if she continues to internalize her feelings, and MOB verbalized understanding of negative consequences.  MOB shared belief that she can talk to her family about her feelings, but was receptive to idea of following up with an outpatient therapist since she often finds it difficult to tell her family everything.  MOB presents with insight on importance of caring for herself in order to care for her children.   MOB denied prior mental health history and denied prior history of postpartum depression.  She was attentive and engaged while CSW provided education on postpartum depression, including her increased risk factors for postpartum depression given recent stress with the FOB.    No barriers to discharge.    VI SOCIAL WORK PLAN Social Work Plan  Information/Referral to Community Resources  Patient/Family Education  No Further Intervention Required / No Barriers to Discharge   Type of pt/family education:   Postpartum depression   If child protective services report - county:   If child protective services report - date:   Information/referral to community resources comment:   Outpatient mental health  resources were provided to MOB.   Other social work plan:   CSW to provide ongoing emotional support PRN.     

## 2014-03-27 NOTE — Progress Notes (Signed)
Post Operative Day 1 Subjective:  Miranda Bowen is a 28 y.o. [id number] [redacted]w[redacted]d s/p repeat low transverse Cesarean section secondary to failed TOLAC after IOL.  No acute events overnight.  Pt denies problems with ambulating or po intake. Pt still has Foley catheter in place. She denies nausea or vomiting.  Pain is well controlled.  She has had flatus. She has not had bowel movement.  Lochia Small.  Plan for birth control is Depo vs OCP.  Method of Feeding: Breast/Bottle  Objective: Blood pressure 105/80, pulse 122, temperature 98.1 F (36.7 C), temperature source Oral, resp. rate 18, height  (1.702 m), weight 81.647 kg (180 lb), last menstrual period 06/06/2013, SpO2 99.00%, unknown if currently breastfeeding.  Physical Exam:  General: alert, cooperative and no distress Lochia:normal flow Chest: CTAB Heart: RRR no m/r/g Abdomen: +BS, soft, nontender,  Uterine Fundus: firm, below umbilicus DVT Evaluation: No evidence of DVT seen on physical exam. Extremities: no peripheral edema   Recent Labs  03/25/14 0810 03/27/14 0550  HGB 10.5* 8.4*  HCT 32.9* 26.3*    Assessment/Plan:  ASSESSMENT: Miranda Bowen is a 28 y.o. [id number] [redacted]w[redacted]d s/p rLTCS  Breastfeeding and Contraception Depo vs OCP, Out-pt Circ   LOS: 2 days   Azucena Cecil, PA-S2 03/27/2014, 7:42 AM  I have seen the patient with the resident/student and agree with the above.  Tawnya Crook

## 2014-03-28 MED ORDER — IBUPROFEN 600 MG PO TABS: 600.0000 mg | ORAL_TABLET | Freq: Four times a day (QID) | ORAL | Status: DC

## 2014-03-28 MED ORDER — SENNOSIDES-DOCUSATE SODIUM 8.6-50 MG PO TABS: 2.0000 | ORAL_TABLET | Freq: Two times a day (BID) | ORAL | Status: DC

## 2014-03-28 MED ORDER — OXYCODONE-ACETAMINOPHEN 5-325 MG PO TABS: 1.0000 | ORAL_TABLET | ORAL | Status: DC | PRN

## 2014-03-28 NOTE — Progress Notes (Signed)
CSW met with MOB in order to follow-up and provide her with mental health resources, per MOB's request.  MOB expressed gratitude for resources and expressed motivation to follow-up.   CSW inquired about how she is feelings as she transitions into the postpartum period and prepares to return home.  She shared that she feels tired and overwhelmed at times, but feels that she is doing "okay".  CSW and MOB processed the visit from the FOB and his family on 9/16, which resulted in her crying and the family leaving since she felt overwhelmed by the visitors and was reminded of the loss of their relationship.  MOB responded appropriately as CSW validated her feelings, and continued to encouraged MOB to express her feelings instead of her natural desire to internalize them.   MOB's sister arrived toward the end of the CSW visit.  CSW provided MOB with CSW contact information if she has additional questions or concerns.  

## 2014-03-28 NOTE — Discharge Summary (Signed)
Obstetric Discharge Summary Reason for Admission: induction of labor Prenatal Procedures: ultrasound Intrapartum Procedures: cesarean: low cervical, transverse, IV antibiotics Postpartum Procedures: none Complications-Operative and Postpartum: none Hemoglobin  Date Value Ref Range Status  03/27/2014 8.4* 12.0 - 15.0 g/dL Final     DELTA CHECK NOTED     REPEATED TO VERIFY     HCT  Date Value Ref Range Status  03/27/2014 26.3* 36.0 - 46.0 % Final   Hospital Course:  Miranda Bowen is a 28 y.o. year old G36P1001 female at [redacted]w[redacted]d weeks gestation who presents to [facility] for IOL for social issues. FOB left her and she has limited social support. Lives alone w/ her young child. Pt was strongly urged to await SOL, but pt very strongly desired IOL. Unfortunately, TOLAC was unsuccessful and patient underwent repeat LTCS given development of chorioamnionitis. She delivered a living female infant with Apgars of 9&9, intact placenta with 3 vessel cord, normal adnexa  ASSISTANTS: none  ANESTHESIA: epidural  EBL: Total I/O  In: 1000 [I.V.:1000]  Out: 1100 [Urine:200; Blood:900]  BLOOD ADMINISTERED:none  She has been breast and bottle feeding. She desires depo provera for contraception.   Physical Exam:  General: alert, cooperative and no distress Lochia: appropriate Uterine Fundus: firm Incision: healing well DVT Evaluation: No evidence of DVT seen on physical exam.  Discharge Diagnoses: Term Pregnancy-delivered  Discharge Information: Date: 03/28/2014 Activity: unrestricted and pelvic rest Diet: routine Medications: Ibuprofen, Colace and Percocet Condition: stable Instructions: refer to practice specific booklet Discharge to: home Follow-up Information   Follow up with [facility] In 5 weeks.   Contact information:   [address] [place] [address] [phone]      Newborn Data: Live born female  Birth Weight: 7 lb 6 oz (3345 g) APGAR: 9, 9  Home  with mother.  Tomma Rakers, MD, PGY3 03/28/2014, 9:47 AM  I have seen and examined this patient and I agree with the above. Cam Hai CNM 2:09 PM 03/29/2014

## 2014-03-29 MED ORDER — OXYCODONE-ACETAMINOPHEN 5-325 MG PO TABS: 1.0000 | ORAL_TABLET | ORAL | Status: DC | PRN

## 2014-03-29 NOTE — Discharge Summary (Signed)
Attestation of Attending Supervision of Advanced Practitioner (CNM/NP): Evaluation and management procedures were performed by the Advanced Practitioner under my supervision and collaboration.  I have reviewed the Advanced Practitioner's note and chart, and I agree with the management and plan.  Shaguana Love 03/29/2014 5:48 PM   

## 2014-03-29 NOTE — Discharge Summary (Signed)
Obstetric Discharge Summary  Reason for Admission: induction of labor due to social reasons Prenatal Procedures: ultrasound  Intrapartum Procedures: cesarean: low cervical, transverse, IV antibiotics for chorio Postpartum Procedures: none  Complications-Operative and Postpartum: none  Hemoglobin   Date  Value  Ref Range  Status   03/27/2014  8.4*  12.0 - 15.0 g/dL  Final   DELTA CHECK NOTED   REPEATED TO VERIFY      HCT   Date  Value  Ref Range  Status   03/27/2014  26.3*  36.0 - 46.0 %  Final    Hospital Course:  Miranda Bowen is a 28 y.o. year old G27P1001 female at [redacted]w[redacted]d weeks gestation who presents to [facility] for IOL for social issues. FOB left her and she has limited social support. Lives alone w/ her young child. Pt was strongly urged to await SOL, but pt very strongly desired IOL. Unfortunately, TOLAC was unsuccessful and patient underwent repeat LTCS given development of chorioamnionitis.   She delivered a living female infant with Apgars of 9&9, intact placenta with 3 vessel cord, normal adnexa  ASSISTANTS: none  ANESTHESIA: epidural  EBL: Total I/O  In: 1000 [I.V.:1000]  Out: 1100 [Urine:200; Blood:900]  BLOOD ADMINISTERED:none   She has been breast and bottle feeding. She desires depo provera for contraception.   Physical Exam:  General: alert, cooperative and no distress  Lochia: appropriate  Uterine Fundus: firm  Incision: healing well  DVT Evaluation: No evidence of DVT seen on physical exam.   Discharge Diagnoses: Term Pregnancy-delivered   Discharge Information:  Date: 03/29/2014  Activity: pelvic rest  Diet: routine  Medications: Ibuprofen, Colace and Percocet  Condition: stable  Instructions: refer to practice specific booklet  Discharge to: home  Follow-up Information    Follow up with [facility] In 5 weeks.    Contact information:    [address]  [place] [address]  [phone]      Newborn Data:  Live born female   Birth Weight: 7 lb 6 oz (3345 g)  APGAR: 9, 9   Home with mother.  Note by Rodrigo Ran MD [facility]  I have seen and examined this patient and I agree with the above. Cam Hai CNM 2:04 PM 03/29/2014

## 2014-03-31 NOTE — Discharge Summary (Signed)
`````  Attestation of Attending Supervision of Advanced Practitioner: Evaluation and management procedures were performed by the PA/NP/CNM/OB Fellow under my supervision/collaboration. Chart reviewed and agree with management and plan.  Venetta Knee V 03/31/2014 9:05 PM

## 2014-05-01 ENCOUNTER — Ambulatory Visit (INDEPENDENT_AMBULATORY_CARE_PROVIDER_SITE_OTHER): Payer: Medicaid Other | Admitting: Obstetrics & Gynecology

## 2014-05-01 ENCOUNTER — Encounter: Payer: Self-pay | Admitting: Obstetrics & Gynecology

## 2014-05-01 VITALS — BP 127/76 | HR 63 | Temp 98.4°F | Ht 67.0 in | Wt 150.2 lb

## 2014-05-01 DIAGNOSIS — Z3042 Encounter for surveillance of injectable contraceptive: Secondary | ICD-10-CM | POA: Diagnosis not present

## 2014-05-01 DIAGNOSIS — Z30013 Encounter for initial prescription of injectable contraceptive: Secondary | ICD-10-CM

## 2014-05-01 DIAGNOSIS — Z01812 Encounter for preprocedural laboratory examination: Secondary | ICD-10-CM

## 2014-05-01 LAB — POCT PREGNANCY, URINE: Preg Test, Ur: NEGATIVE

## 2014-05-01 MED ORDER — MEDROXYPROGESTERONE ACETATE 104 MG/0.65ML ~~LOC~~ SUSP
104.0000 mg | Freq: Once | SUBCUTANEOUS | Status: AC
  Administered 2014-05-01: 104 mg via SUBCUTANEOUS

## 2014-05-01 MED ORDER — MEDROXYPROGESTERONE ACETATE 104 MG/0.65ML ~~LOC~~ SUSP
104.0000 mg | SUBCUTANEOUS | Status: DC
Start: 2014-05-01 — End: 2015-02-07

## 2014-05-01 NOTE — Progress Notes (Signed)
Patient here for PP visit. Desires depo provera for contraception. Denies having any sexual intercourse since delivery. UPT obtained and negative.

## 2014-05-01 NOTE — Progress Notes (Signed)
Patient ID: Miranda Bowen, female   DOB: 1985-12-26, 28 y.o.   MRN: [id number] Subjective: repeat CS failed VBAC     Miranda Bowen is a 28 y.o. female who presents for a postpartum visit. She is 6 weeks postpartum following a low cervical transverse Cesarean section. I have fully reviewed the prenatal and intrapartum course. The delivery was at 40 gestational weeks. Outcome: repeat cesarean section, low transverse incision. Anesthesia: epidural. Postpartum course has been good. Baby's course has been good. Baby is feeding by bottle -  . Bleeding no bleeding. Bowel function is normal. Bladder function is normal. Patient is not sexually active. Contraception method is Depo-Provera injections. Postpartum depression screening: negative.  The following portions of the patient's history were reviewed and updated as appropriate: allergies, current medications, past family history, past medical history, past social history, past surgical history and problem list.  Review of Systems Pertinent items are noted in HPI.   Objective:    BP 127/76  Pulse 63  Temp(Src) 98.4 F (36.9 C) (Oral)  Ht 5\' 7"  (1.702 m)  Wt 150 lb 3.2 oz (68.13 kg)  BMI 23.52 kg/m2  Breastfeeding? No  General:  alert, cooperative and no distress           Abdomen: soft, non-tender; bowel sounds normal; no masses,  no organomegaly and incison dry intact   Vulva:  not evaluated  Vagina: not evaluated  Cervix:     Corpus: not examined  Adnexa:  not evaluated  Rectal Exam: Not performed.        Assessment:     normal postpartum exam. Pap smear not done at today's visit.   Plan:    1. Contraception: Depo-Provera injections 2. Return to work 8 week pp 3. Follow up in: 3 months or as needed.   Adam PhenixJames G Arnold, MD 05/01/2014

## 2014-05-13 ENCOUNTER — Encounter: Payer: Self-pay | Admitting: Obstetrics & Gynecology

## 2014-05-24 ENCOUNTER — Encounter: Payer: Self-pay | Admitting: Obstetrics & Gynecology

## 2014-05-29 ENCOUNTER — Encounter (HOSPITAL_COMMUNITY): Payer: Self-pay | Admitting: *Deleted

## 2014-05-29 ENCOUNTER — Emergency Department (HOSPITAL_COMMUNITY)
Admission: EM | Admit: 2014-05-29 | Discharge: 2014-05-29 | Disposition: A | Discharge status: Home / Self Care | Payer: Medicaid Other | Attending: Emergency Medicine | Admitting: Emergency Medicine

## 2014-05-29 DIAGNOSIS — Z79899 Other long term (current) drug therapy: Secondary | ICD-10-CM | POA: Diagnosis not present

## 2014-05-29 DIAGNOSIS — L02412 Cutaneous abscess of left axilla: Secondary | ICD-10-CM | POA: Insufficient documentation

## 2014-05-29 MED ORDER — HYDROCODONE-ACETAMINOPHEN 5-325 MG PO TABS: 1.0000 | ORAL_TABLET | Freq: Four times a day (QID) | ORAL | Status: DC | PRN

## 2014-05-29 MED ORDER — IBUPROFEN 400 MG PO TABS
400.0000 mg | ORAL_TABLET | Freq: Once | ORAL | Status: AC
  Administered 2014-05-29: 400 mg via ORAL
  Filled 2014-05-29: qty 1

## 2014-05-29 MED ORDER — NAPROXEN 500 MG PO TABS: 500.0000 mg | ORAL_TABLET | Freq: Two times a day (BID) | ORAL | Status: DC | PRN

## 2014-05-29 MED ORDER — CEPHALEXIN 500 MG PO CAPS: ORAL_CAPSULE | ORAL | Status: DC

## 2014-05-29 MED ORDER — LIDOCAINE HCL 2 % IJ SOLN
10.0000 mL | Freq: Once | INTRAMUSCULAR | Status: AC
  Administered 2014-05-29: 10 mL
  Filled 2014-05-29: qty 20

## 2014-05-29 NOTE — Discharge Instructions (Signed)
Keep wound clean and dry. Apply warm compresses to affected area throughout the day. Take antibiotic until it is finished. Take naprosyn and norco as directed, as needed for pain but do not drive or operate machinery with pain medication use. [facility] for wound recheck and packing removal. Monitor area for signs of infection to include, but not limited to: increasing pain, redness, drainage/pus, or swelling. Return to emergency department for emergent changing or worsening symptoms.   Abscess An abscess (boil or furuncle) is an infected area on or under the skin. This area is filled with yellowish-white fluid (pus) and other material (debris). HOME CARE   Only take medicines as told by your doctor.  If you were given antibiotic medicine, take it as directed. Finish the medicine even if you start to feel better.  If gauze is used, follow your doctor's directions for changing the gauze.  To avoid spreading the infection:  Keep your abscess covered with a bandage.  Wash your hands well.  Do not share personal care items, towels, or whirlpools with others.  Avoid skin contact with others.  Keep your skin and clothes clean around the abscess.  Keep all doctor visits as told. GET HELP RIGHT AWAY IF:   You have more pain, puffiness (swelling), or redness in the wound site.  You have more fluid or blood coming from the wound site.  You have muscle aches, chills, or you feel sick.  You have a fever. MAKE SURE YOU:   Understand these instructions.  Will watch your condition.  Will get help right away if you are not doing well or get worse. Document Released: 12/15/2007 Document Revised: 12/28/2011 Document Reviewed: 09/10/2011 [facility] Patient Information 2015 [place]. This information is not intended to replace advice given to you by your health care provider. Make sure you discuss any questions you have with your  health care provider.

## 2014-05-29 NOTE — ED Notes (Signed)
PA at bedside.

## 2014-05-29 NOTE — ED Notes (Signed)
Pt A&OX4, ambulatory at d/c with steady gait, NAD 

## 2014-05-29 NOTE — ED Provider Notes (Signed)
CSN: [id number]     Arrival date & time 05/29/14  1812 History   None    This chart was scribed for non-physician practitioner, Allen DerryMercedes Camprubi-Soms PA-C  working with Purvis SheffieldForrest Harrison, MD by Arlan OrganAshley Leger, ED Scribe. This patient was seen in room TR07C/TR07C and the patient's care was started at 6:30 PM.   Chief Complaint  Patient presents with  . Abscess   Patient is a 28 y.o. female presenting with abscess. The history is provided by the patient. No language interpreter was used.  Abscess Location:  Shoulder/arm Shoulder/arm abscess location:  L axilla Size:  6 cm x 2 cm Abscess quality: fluctuance, induration and painful   Abscess quality: not draining   Duration:  3 days Progression:  Worsening Pain details:    Quality:  Throbbing   Severity:  Severe   Duration:  3 days   Timing:  Constant   Progression:  Worsening Chronicity:  Recurrent Context: not diabetes, not immunosuppression, not injected drug use, not insect bite/sting and not skin injury   Relieved by:  Nothing Worsened by:  Draining/squeezing Ineffective treatments:  Warm compresses Associated symptoms: no fever, no nausea and no vomiting   Risk factors: prior abscess     HPI Comments: Miranda Bowen is a 28 y.o. healthy female who presents to the Emergency Department complaining of an abscess to the L axilla with associated discomfort x 3 days that has progressively worsened. No known injury or trauma to the area. She describes pain as throbbing currently rated 10/10. Pain is exacerbated with palpation and movement of the arm. She has tried warm compresses to the area with mild temporary improvement for symptoms. No noticeable drainage. She denies any numbness or paresthesia to the extremity. No fever, chills, SOB, CP, abdominal pain, nausea, or vomiting. No breast swelling or redness, no nipple discharge. Miranda Bowen is not currently breast feeding. No known allergies to medications. She has had an abscess to this  area before which required I&D. Nonsmoker without any immunocompromising conditions.  Past Medical History  Diagnosis Date  . No pertinent past medical history   . Medical history non-contributory    Past Surgical History  Procedure Laterality Date  . Cesarean section    . Cesarean section N/A 03/26/2014    Procedure: CESAREAN SECTION;  Surgeon: Allie BossierMyra C Dove, MD;  Location: [facility];  Service: Obstetrics;  Laterality: N/A;   Family History  Problem Relation Age of Onset  . Hypertension Mother    History  Substance Use Topics  . Smoking status: Never Smoker   . Smokeless tobacco: Never Used  . Alcohol Use: No   OB History    Gravida Para Term Preterm AB TAB SAB Ectopic Multiple Living   2 2 2   0  0   2     Review of Systems  Constitutional: Negative for fever and chills.  Respiratory: Negative for shortness of breath.   Cardiovascular: Negative for chest pain.  Gastrointestinal: Negative for nausea, vomiting and abdominal pain.  Skin: Positive for wound (abscess). Negative for color change.  Neurological: Negative for weakness and numbness.   A complete 10 system review of systems was obtained and all systems are negative except as noted in the HPI and PMH.    Allergies  Review of patient's allergies indicates no known allergies.  Home Medications   Prior to Admission medications   Medication Sig Start Date End Date Taking? Authorizing Provider  ibuprofen (ADVIL,MOTRIN) 600 MG tablet Take 1 tablet (  600 mg total) by mouth every 6 (six) hours. 03/28/14   Christianne BorrowSadia Ali, MD  medroxyPROGESTERone (DEPO-SUBQ PROVERA 104) 104 MG/0.65ML injection Inject 0.65 mLs (104 mg total) into the skin every 3 (three) months. 05/01/14   Adam PhenixJames G Arnold, MD  oxyCODONE-acetaminophen (PERCOCET/ROXICET) 5-325 MG per tablet Take 1 tablet by mouth every 4 (four) hours as needed for severe pain. 03/29/14   Joanna Puffrystal S Dorsey, MD  Prenatal Vit-Fe Fumarate-FA (PRENATAL MULTIVITAMIN) TABS tablet Take 1 tablet by  mouth daily at 12 noon.    Historical Provider, MD  senna-docusate (SENOKOT-S) 8.6-50 MG per tablet Take 2 tablets by mouth 2 (two) times daily. 03/28/14   Christianne BorrowSadia Ali, MD   Triage Vitals: BP 103/75 mmHg  Pulse 99  Temp(Src) 98.5 F (36.9 C) (Oral)  Resp 18  SpO2 99%   Physical Exam  Constitutional: She is oriented to person, place, and time. Vital signs are normal. She appears well-developed and well-nourished.  Non-toxic appearance. No distress.  Afebrile, nontoxic, well-appearing. VSS  HENT:  Head: Normocephalic and atraumatic.  Mouth/Throat: Mucous membranes are normal.  Eyes: Conjunctivae and EOM are normal.  Neck: Normal range of motion. Neck supple.  Cardiovascular: Normal rate and intact distal pulses.   Distal pulses intact  Pulmonary/Chest: Effort normal. No respiratory distress.  Abdominal: Normal appearance. She exhibits no distension.  Musculoskeletal: Normal range of motion.  ROM limited to L shoulder due to pain at abscess in L axilla  Lymphadenopathy:    She has no axillary adenopathy.  No axillary LAD  Neurological: She is alert and oriented to person, place, and time. She has normal strength. No sensory deficit.  Skin: Skin is warm, dry and intact. Lesion (abscess) noted. No erythema.  Approx 6 cm x 2 cm x 2cm abscess to L axilla with central fluctuance and induration near the edges, no warmth or erythema noted, no drainage, no surrounding cellulitis  Psychiatric: She has a normal mood and affect.  Nursing note and vitals reviewed.   ED Course  INCISION AND DRAINAGE Date/Time: 05/29/2014 7:24 PM Performed by: Marjean DonnaAMPRUBI-SOMS, Clorissa Gruenberg STRUPP Authorized by: Ramond MarrowAMPRUBI-SOMS, Riddik Senna STRUPP Consent: Verbal consent obtained. Risks and benefits: risks, benefits and alternatives were discussed Consent given by: patient Patient understanding: patient states understanding of the procedure being performed Patient consent: the patient's understanding of the procedure  matches consent given Patient identity confirmed: verbally with patient Type: abscess Body area: trunk (L axilla) Anesthesia: local infiltration Local anesthetic: lidocaine 2% without epinephrine Anesthetic total: 3 ml Patient sedated: no Scalpel size: 11 Needle gauge: 22 Incision type: single straight Complexity: simple Drainage: purulent Drainage amount: moderate Packing material: 1/4 in iodoform gauze Patient tolerance: Patient tolerated the procedure well with no immediate complications   (including critical care time)  DIAGNOSTIC STUDIES: Oxygen Saturation is 99% on RA, Normal by my interpretation.    COORDINATION OF CARE: 6:56 PM- Will give Ibuprofen here in ED. Discussed treatment plan with pt at bedside and pt agreed to plan.      Labs Review Labs Reviewed - No data to display  Imaging Review No results found.   EKG Interpretation None      MDM   Final diagnoses:  Abscess of axilla, left    28y/o female with L axilla abscess. Pt afebrile and nontoxic, without surrounding cellulitis. Ultrasound performed by Dr. Romeo AppleHarrison at bedside to evaluate the depth of this area, and he agrees with my evaluation that this is an abscess versus a lymph node. Proceeded with I&D, moderate amount  of purulent drainage elicited. Ibuprofen given for pain since pt is driving, and preferred this over tylenol. Discussed proper wound care with the patient, as well as follow-up in 2 days with urgent care or the ED to have packing removed and wound checked. Rx for pain medication given. Prescription for Keflex written. I explained the diagnosis and have given explicit precautions to return to the ER including for any other new or worsening symptoms. The patient understands and accepts the medical plan as it's been dictated and I have answered their questions. Discharge instructions concerning home care and prescriptions have been given. The patient is STABLE and is discharged to home in good  condition.   I personally performed the services described in this documentation, which was scribed in my presence. The recorded information has been reviewed and is accurate.   BP 103/75 mmHg  Pulse 99  Temp(Src) 98.5 F (36.9 C) (Oral)  Resp 18  SpO2 99%  Meds ordered this encounter  Medications  . ibuprofen (ADVIL,MOTRIN) tablet 400 mg    Sig:   . lidocaine (XYLOCAINE) 2 % (with pres) injection 200 mg    Sig:   . naproxen (NAPROSYN) 500 MG tablet    Sig: Take 1 tablet (500 mg total) by mouth 2 (two) times daily as needed for mild pain, moderate pain or headache (TAKE WITH MEALS.).    Dispense:  20 tablet    Refill:  0    Order Specific Question:  Supervising Provider    Answer:  Eber Hong D [[id number]]  . HYDROcodone-acetaminophen (NORCO) 5-325 MG per tablet    Sig: Take 1-2 tablets by mouth every 6 (six) hours as needed for severe pain.    Dispense:  6 tablet    Refill:  0    Order Specific Question:  Supervising Provider    Answer:  Eber Hong D [[id number]]  . cephALEXin (KEFLEX) 500 MG capsule    Sig: 2 caps po bid x 7 days    Dispense:  28 capsule    Refill:  0    Order Specific Question:  Supervising Provider    Answer:  Vida Roller [address] Camprubi-Soms, PA-C 05/29/14 1947  Purvis Sheffield, MD 05/30/14 0008

## 2014-05-29 NOTE — ED Notes (Signed)
Pt reports having boil under left arm that is causing pain. No acute distress noted at triage.

## 2014-07-24 ENCOUNTER — Ambulatory Visit: Payer: Medicaid Other

## 2015-02-07 ENCOUNTER — Inpatient Hospital Stay (HOSPITAL_COMMUNITY): Payer: Medicaid Other

## 2015-02-07 ENCOUNTER — Encounter (HOSPITAL_COMMUNITY): Payer: Self-pay | Admitting: *Deleted

## 2015-02-07 ENCOUNTER — Inpatient Hospital Stay (HOSPITAL_COMMUNITY)
Admission: AD | Admit: 2015-02-07 | Discharge: 2015-02-07 | Disposition: A | Discharge status: Home / Self Care | Payer: Medicaid Other | Source: Ambulatory Visit | Attending: Family Medicine | Admitting: Family Medicine

## 2015-02-07 DIAGNOSIS — Z3A01 Less than 8 weeks gestation of pregnancy: Secondary | ICD-10-CM | POA: Diagnosis not present

## 2015-02-07 DIAGNOSIS — R109 Unspecified abdominal pain: Secondary | ICD-10-CM | POA: Insufficient documentation

## 2015-02-07 DIAGNOSIS — O26899 Other specified pregnancy related conditions, unspecified trimester: Secondary | ICD-10-CM

## 2015-02-07 DIAGNOSIS — O9989 Other specified diseases and conditions complicating pregnancy, childbirth and the puerperium: Secondary | ICD-10-CM | POA: Diagnosis not present

## 2015-02-07 DIAGNOSIS — R42 Dizziness and giddiness: Secondary | ICD-10-CM | POA: Insufficient documentation

## 2015-02-07 LAB — OB RESULTS CONSOLE HIV ANTIBODY (ROUTINE TESTING): HIV: NONREACTIVE

## 2015-02-07 LAB — CBC
HCT: 35.4 % — ABNORMAL LOW (ref 36.0–46.0)
Hemoglobin: 12.2 g/dL (ref 12.0–15.0)
MCH: 29.7 pg (ref 26.0–34.0)
MCHC: 34.5 g/dL (ref 30.0–36.0)
MCV: 86.1 fL (ref 78.0–100.0)
Platelets: 333 10*3/uL (ref 150–400)
RBC: 4.11 MIL/uL (ref 3.87–5.11)
RDW: 13.2 % (ref 11.5–15.5)
WBC: 9.7 10*3/uL (ref 4.0–10.5)

## 2015-02-07 LAB — WET PREP, GENITAL
TRICH WET PREP: NONE SEEN
WBC WET PREP: NONE SEEN
Yeast Wet Prep HPF POC: NONE SEEN

## 2015-02-07 LAB — URINALYSIS, ROUTINE W REFLEX MICROSCOPIC
BILIRUBIN URINE: NEGATIVE
GLUCOSE, UA: NEGATIVE mg/dL
HGB URINE DIPSTICK: NEGATIVE
Ketones, ur: NEGATIVE mg/dL
LEUKOCYTES UA: NEGATIVE
NITRITE: NEGATIVE
Protein, ur: NEGATIVE mg/dL
Specific Gravity, Urine: 1.02 (ref 1.005–1.030)
Urobilinogen, UA: 0.2 mg/dL (ref 0.0–1.0)
pH: 7 (ref 5.0–8.0)

## 2015-02-07 LAB — POCT PREGNANCY, URINE: Preg Test, Ur: POSITIVE — AB

## 2015-02-07 LAB — HCG, QUANTITATIVE, PREGNANCY: hCG, Beta Chain, Quant, S: 19016 m[IU]/mL — ABNORMAL HIGH (ref <5)

## 2015-02-07 LAB — OB RESULTS CONSOLE GC/CHLAMYDIA: Gonorrhea: NEGATIVE

## 2015-02-07 MED ORDER — PRENATAL VITAMINS 0.8 MG PO TABS: 1.0000 | ORAL_TABLET | Freq: Every day | ORAL | Status: AC

## 2015-02-07 NOTE — Discharge Instructions (Signed)

## 2015-02-07 NOTE — MAU Note (Signed)
Feeling dizzy and lightheaded since Monday. Dizziness makes me feel nauseated.

## 2015-02-07 NOTE — MAU Provider Note (Signed)
History     CSN: [id number]  Arrival date and time: 02/07/15 1945   First Provider Initiated Contact with Patient 02/07/15 2141      Chief Complaint  Patient presents with  . Dizziness   Dizziness This is a new problem. The current episode started in the past 7 days. The problem occurs intermittently. The problem has been unchanged. Associated symptoms include abdominal pain. Pertinent negatives include no chest pain, chills, fatigue, fever, headaches, myalgias, nausea, urinary symptoms, vertigo, vomiting or weakness. The symptoms are aggravated by walking. She has tried nothing for the symptoms.   This is a 29 y.o. female at [redacted]w[redacted]d by LMP who presents with c/o lightheadedness which started Monday.  States it is not room spinning type, just light headedness.  Denies fever or headache. Denies nausea or vomiting  Thinks she might be pregnant, not sure. Last DepoProvera January. Just delivered via C/S (repeat) 10 mos ago. Has had some cramping in lower abdomen since last week. Not severe, Dull cramping, Does not radiate.  RN note:  Expand All Collapse All   Feeling dizzy and lightheaded since Monday. Dizziness makes me feel nauseated.          OB History    Gravida Para Term Preterm AB TAB SAB Ectopic Multiple Living   0  0   2      Past Medical History  Diagnosis Date  . No pertinent past medical history   . Medical history non-contributory     Past Surgical History  Procedure Laterality Date  . Cesarean section    . Cesarean section N/A 03/26/2014    Procedure: CESAREAN SECTION;  Surgeon: Allie Bossier, MD;  Location: [facility];  Service: Obstetrics;  Laterality: N/A;    Family History  Problem Relation Age of Onset  . Hypertension Mother     History  Substance Use Topics  . Smoking status: Never Smoker   . Smokeless tobacco: Never Used  . Alcohol Use: No    Allergies: No Known Allergies  Prescriptions prior to admission  Medication Sig Dispense Refill  Last Dose  . cephALEXin (KEFLEX) 500 MG capsule 2 caps po bid x 7 days (Patient not taking: Reported on 02/07/2015) 28 capsule 0 Not Taking at Unknown time  . HYDROcodone-acetaminophen (NORCO) 5-325 MG per tablet Take 1-2 tablets by mouth every 6 (six) hours as needed for severe pain. (Patient not taking: Reported on 02/07/2015) 6 tablet 0 Not Taking at Unknown time  . ibuprofen (ADVIL,MOTRIN) 600 MG tablet Take 1 tablet (600 mg total) by mouth every 6 (six) hours. (Patient not taking: Reported on 02/07/2015) 30 tablet 0 Not Taking at Unknown time  . medroxyPROGESTERone (DEPO-SUBQ PROVERA 104) 104 MG/0.65ML injection Inject 0.65 mLs (104 mg total) into the skin every 3 (three) months. (Patient not taking: Reported on 02/07/2015) 0.65 mL 12 Not Taking at Unknown time  . naproxen (NAPROSYN) 500 MG tablet Take 1 tablet (500 mg total) by mouth 2 (two) times daily as needed for mild pain, moderate pain or headache (TAKE WITH MEALS.). (Patient not taking: Reported on 02/07/2015) 20 tablet 0 Not Taking at Unknown time  . oxyCODONE-acetaminophen (PERCOCET/ROXICET) 5-325 MG per tablet Take 1 tablet by mouth every 4 (four) hours as needed for severe pain. (Patient not taking: Reported on 02/07/2015) 30 tablet 0 Not Taking at Unknown time  . senna-docusate (SENOKOT-S) 8.6-50 MG per tablet Take 2 tablets by mouth 2 (two) times daily. (Patient not taking: Reported on  02/07/2015) 60 tablet 0 Not Taking at Unknown time  Medical, Surgical, Family and Social histories reviewed and are listed above.  Medications and allergies reviewed.    Review of Systems  Constitutional: Negative for fever, chills, malaise/fatigue and fatigue.  Eyes: Negative for blurred vision and double vision.  Respiratory: Negative for shortness of breath.   Cardiovascular: Negative for chest pain.  Gastrointestinal: Positive for abdominal pain. Negative for nausea, vomiting, diarrhea and constipation.  Musculoskeletal: Negative for myalgias.   Neurological: Positive for dizziness. Negative for vertigo, weakness and headaches.  Other systems negative  Physical Exam   Blood pressure 117/85, pulse 85, temperature 99 F (37.2 C), resp. rate 18, height 5\' 7"  (1.702 m), weight 141 lb 12.8 oz (64.32 kg), last menstrual period 12/25/2014, SpO2 100 %, not currently breastfeeding.  Physical Exam  Constitutional: She is oriented to person, place, and time. She appears well-developed and well-nourished. No distress.  HENT:  Head: Normocephalic.  Neck: Normal range of motion.  Cardiovascular: Normal rate and regular rhythm.   Respiratory: Effort normal and breath sounds normal. No respiratory distress. She has no wheezes. She has no rales.  GI: Soft. She exhibits no distension and no mass. There is no tenderness. There is no rebound and no guarding.  Genitourinary: No vaginal discharge found.  Cervix closed Uterus 6 wk size Nontender  Adnexa nontender bilaterally  Musculoskeletal: Normal range of motion.  Neurological: She is alert and oriented to person, place, and time.  Skin: Skin is warm and dry.  Psychiatric: She has a normal mood and affect.    MAU Course  Procedures  MDM Need to rule out ectopic pregnancy which can be life-threatening: Cultures done Wet prep done Quant HCG, CBC ordered Ultrasound ordered Results for orders placed or performed during the hospital encounter of 02/07/15 (from the past 24 hour(s))  Urinalysis, Routine w reflex microscopic (not at [facility])     Status: None   Collection Time: 02/07/15  8:30 PM  Result Value Ref Range   Color, Urine YELLOW YELLOW   APPearance CLEAR CLEAR   Specific Gravity, Urine 1.020 1.005 - 1.030   pH 7.0 5.0 - 8.0   Glucose, UA NEGATIVE NEGATIVE mg/dL   Hgb urine dipstick NEGATIVE NEGATIVE   Bilirubin Urine NEGATIVE NEGATIVE   Ketones, ur NEGATIVE NEGATIVE mg/dL   Protein, ur NEGATIVE NEGATIVE mg/dL   Urobilinogen, UA 0.2 0.0 - 1.0 mg/dL   Nitrite NEGATIVE NEGATIVE    Leukocytes, UA NEGATIVE NEGATIVE  Pregnancy, urine POC     Status: Abnormal   Collection Time: 02/07/15  8:37 PM  Result Value Ref Range   Preg Test, Ur POSITIVE (A) NEGATIVE  Wet prep, genital     Status: Abnormal   Collection Time: 02/07/15  9:50 PM  Result Value Ref Range   Yeast Wet Prep HPF POC NONE SEEN NONE SEEN   Trich, Wet Prep NONE SEEN NONE SEEN   Clue Cells Wet Prep HPF POC FEW (A) NONE SEEN   WBC, Wet Prep HPF POC NONE SEEN NONE SEEN  CBC     Status: Abnormal   Collection Time: 02/07/15 10:05 PM  Result Value Ref Range   WBC 9.7 4.0 - 10.5 K/uL   RBC 4.11 3.87 - 5.11 MIL/uL   Hemoglobin 12.2 12.0 - 15.0 g/dL   HCT 16.1 (L) 09.6 - 04.5 %   MCV 86.1 78.0 - 100.0 fL   MCH 29.7 26.0 - 34.0 pg   MCHC 34.5 30.0 - 36.0 g/dL  RDW 13.2 11.5 - 15.5 %   Platelets 333 150 - 400 K/uL  hCG, quantitative, pregnancy     Status: Abnormal   Collection Time: 02/07/15 10:05 PM  Result Value Ref Range   hCG, Beta Chain, Quant, S 19016 (H) <5 mIU/mL   US Ob Comp Less 14 Wks  02/07/2015   CLINICAL DATA:  Dizziness, nausea  EXAM: OBSTETRIC <14 WK [place] AND TRANSVAGINAL OB US  TECHNIQUE: Both transabdominal and transvaginal ultrasound examinations were performed for complete evaluation of the gestation as well as the maternal uterus, adnexal regions, and pelvic cul-de-sac. Transvaginal technique was performed to assess early pregnancy.  COMPARISON:  None.  FINDINGS: Intrauterine gestational sac: Visualized/normal in shape.  Yolk sac:  Visualized  Embryo:  Not visualized  Cardiac Activity: Not visualized  Heart Rate:   bpm  MSD: 11.4  Mm   5 w   6  d  CRL:    mm    w    d                  [place] EDC: 10/04/2015  Maternal uterus/adnexae: No subchorionic hemorrhage. Ovaries symmetric in size and echotexture. No adnexal masses. Small amount of free fluid.  IMPRESSION: Early intrauterine pregnancy, 5 weeks 6 days by mean sac diameter. No fetal pole currently. No acute maternal findings.   Electronically  Signed   By: Charlett Nose M.D.   On: 02/07/2015 22:52   US Ob Transvaginal  02/07/2015   CLINICAL DATA:  Dizziness, nausea  EXAM: OBSTETRIC <14 WK [place] AND TRANSVAGINAL OB US  TECHNIQUE: Both transabdominal and transvaginal ultrasound examinations were performed for complete evaluation of the gestation as well as the maternal uterus, adnexal regions, and pelvic cul-de-sac. Transvaginal technique was performed to assess early pregnancy.  COMPARISON:  None.  FINDINGS: Intrauterine gestational sac: Visualized/normal in shape.  Yolk sac:  Visualized  Embryo:  Not visualized  Cardiac Activity: Not visualized  Heart Rate:   bpm  MSD: 11.4  Mm   5 w   6  d  CRL:    mm    w    d                  [place] EDC: 10/04/2015  Maternal uterus/adnexae: No subchorionic hemorrhage. Ovaries symmetric in size and echotexture. No adnexal masses. Small amount of free fluid.  IMPRESSION: Early intrauterine pregnancy, 5 weeks 6 days by mean sac diameter. No fetal pole currently. No acute maternal findings.   Electronically Signed   By: Charlett Nose M.D.   On: 02/07/2015 22:52    Assessment and Plan  A:  SIUP at [redacted]w[redacted]d by LMP, [redacted]w[redacted]d by Gest Sac       Abdominal pain, cramping       Yolk sac effectively rules out ectopic pregnancy        Dizziness probably secondary to hemodynamic changes of pregnancy  P; Discharge home      Discussed findings with patient      Encouraged to start prenatal care      Start OTC prenatal vitamins  [facility] 02/07/2015, 9:41 PM

## 2015-02-08 LAB — HIV ANTIBODY (ROUTINE TESTING W REFLEX): HIV Screen 4th Generation wRfx: NONREACTIVE

## 2015-02-10 LAB — GC/CHLAMYDIA PROBE AMP (~~LOC~~) NOT AT ARMC
CHLAMYDIA, DNA PROBE: NEGATIVE
NEISSERIA GONORRHEA: NEGATIVE

## 2015-02-26 ENCOUNTER — Encounter (HOSPITAL_COMMUNITY): Payer: Self-pay | Admitting: *Deleted

## 2015-02-26 ENCOUNTER — Inpatient Hospital Stay (HOSPITAL_COMMUNITY)
Admission: AD | Admit: 2015-02-26 | Discharge: 2015-02-26 | Disposition: A | Discharge status: Home / Self Care | Payer: Medicaid Other | Source: Ambulatory Visit | Attending: Family Medicine | Admitting: Family Medicine

## 2015-02-26 ENCOUNTER — Encounter (HOSPITAL_COMMUNITY): Payer: Self-pay

## 2015-02-26 DIAGNOSIS — Z3A09 9 weeks gestation of pregnancy: Secondary | ICD-10-CM | POA: Diagnosis not present

## 2015-02-26 DIAGNOSIS — R197 Diarrhea, unspecified: Secondary | ICD-10-CM | POA: Insufficient documentation

## 2015-02-26 DIAGNOSIS — O9989 Other specified diseases and conditions complicating pregnancy, childbirth and the puerperium: Secondary | ICD-10-CM | POA: Insufficient documentation

## 2015-02-26 LAB — URINALYSIS, ROUTINE W REFLEX MICROSCOPIC
Bilirubin Urine: NEGATIVE
GLUCOSE, UA: NEGATIVE mg/dL
Hgb urine dipstick: NEGATIVE
KETONES UR: NEGATIVE mg/dL
LEUKOCYTES UA: NEGATIVE
Nitrite: NEGATIVE
PROTEIN: NEGATIVE mg/dL
Specific Gravity, Urine: >1.03 — ABNORMAL HIGH (ref 1.005–1.030)
Urobilinogen, UA: 0.2 mg/dL (ref 0.0–1.0)
pH: 6 (ref 5.0–8.0)

## 2015-02-26 NOTE — MAU Note (Signed)
Chart needs to be merged.

## 2015-02-26 NOTE — MAU Provider Note (Signed)
History     CSN: [id number]  Arrival date and time: 02/26/15 6578   First Provider Initiated Contact with Patient 02/26/15 1204      Chief Complaint  Patient presents with  . Abdominal Pain   HPI  Miranda Bowen 29 y.o. [id number] @[redacted]w[redacted]d  presents to MAU complaining of diarrhea.  It started last night. She notes only one episode but it required her to sit on the toilet for 30 minutes.  She then went to bed.  This morning she noted one small loose BM but none since.  She denies abdominal pain, dysuria, vaginal bleeding, weakness, fever.  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    OB History    Gravida Para Term Preterm AB TAB SAB Ectopic Multiple Living   3 2 2       2       Past Medical History  Diagnosis Date  . Medical history non-contributory     Past Surgical History  Procedure Laterality Date  . Cesarean section      No family history on file.  Social History  Substance Use Topics  . Smoking status: Never Smoker   . Smokeless tobacco: None  . Alcohol Use: No    Allergies: Not on File  No prescriptions prior to admission    ROS Pertinent ROS in HPI.  All other systems are negative.   Physical Exam   Blood pressure 110/82, pulse 74, temperature 99.1 F (37.3 C), temperature source Oral, resp. rate 18, height 5\' 6"  (1.676 m), weight 139 lb (63.05 kg), last menstrual period 12/25/2014.  Physical Exam  Constitutional: She is oriented to person, place, and time. She appears well-developed and well-nourished. No distress.  HENT:  Head: Normocephalic and atraumatic.  Eyes: EOM are normal.  Neck: Normal range of motion.  Cardiovascular: Normal rate.   Respiratory: Effort normal and breath sounds normal. No respiratory distress.  GI: Soft. Bowel sounds are normal. She exhibits no distension. There is no tenderness. There is no rebound and no guarding.  Musculoskeletal: Normal range of motion.  Neurological: She is alert and oriented to  person, place, and time.  Skin: Skin is warm and dry.  Psychiatric: She has a normal mood and affect.    MAU Course  Procedures  MDM Pt has other chart and had an U/S confirming IUP previously.   Results for orders placed or performed during the hospital encounter of 02/26/15 (from the past 24 hour(s))  Urinalysis, Routine w reflex microscopic (not at [facility])     Status: Abnormal   Collection Time: 02/26/15 12:26 PM  Result Value Ref Range   Color, Urine YELLOW YELLOW   APPearance CLEAR CLEAR   Specific Gravity, Urine >1.030 (H) 1.005 - 1.030   pH 6.0 5.0 - 8.0   Glucose, UA NEGATIVE NEGATIVE mg/dL   Hgb urine dipstick NEGATIVE NEGATIVE   Bilirubin Urine NEGATIVE NEGATIVE   Ketones, ur NEGATIVE NEGATIVE mg/dL   Protein, ur NEGATIVE NEGATIVE mg/dL   Urobilinogen, UA 0.2 0.0 - 1.0 mg/dL   Nitrite NEGATIVE NEGATIVE   Leukocytes, UA NEGATIVE NEGATIVE   Pt desiring to leave.  No evidence of emergency.    Assessment and Plan  A:  1. Diarrhea   2. [redacted] weeks gestation of pregnancy    P: Discharge to home Good po hydration stressed PNC asap  PNV qd Patient may return to MAU as needed or if her condition were to change or worsen  Bertram Denver 02/26/2015, 12:07 PM

## 2015-02-26 NOTE — MAU Note (Signed)
Pt reports she had a sharp painthat started last night. Pain not as bad today but still  There.

## 2015-02-26 NOTE — Discharge Instructions (Signed)
Diarrhea Diarrhea is frequent loose and watery bowel movements. It can cause you to feel weak and dehydrated. Dehydration can cause you to become tired and thirsty, have a dry mouth, and have decreased urination that often is dark yellow. Diarrhea is a sign of another problem, most often an infection that will not last long. In most cases, diarrhea typically lasts 2-3 days. However, it can last longer if it is a sign of something more serious. It is important to treat your diarrhea as directed by your caregiver to lessen or prevent future episodes of diarrhea. CAUSES  Some common causes include:  Gastrointestinal infections caused by viruses, bacteria, or parasites.  Food poisoning or food allergies.  Certain medicines, such as antibiotics, chemotherapy, and laxatives.  Artificial sweeteners and fructose.  Digestive disorders. HOME CARE INSTRUCTIONS  Ensure adequate fluid intake (hydration): Have 1 cup (8 oz) of fluid for each diarrhea episode. Avoid fluids that contain simple sugars or sports drinks, fruit juices, whole milk products, and sodas. Your urine should be clear or pale yellow if you are drinking enough fluids. Hydrate with an oral rehydration solution that you can purchase at pharmacies, retail stores, and online. You can prepare an oral rehydration solution at home by mixing the following ingredients together:   - tsp table salt.   tsp baking soda.   tsp salt substitute containing potassium chloride.  1  tablespoons sugar.  1 L (34 oz) of water.  Certain foods and beverages may increase the speed at which food moves through the gastrointestinal (GI) tract. These foods and beverages should be avoided and include:  Caffeinated and alcoholic beverages.  High-fiber foods, such as raw fruits and vegetables, nuts, seeds, and whole grain breads and cereals.  Foods and beverages sweetened with sugar alcohols, such as xylitol, sorbitol, and mannitol.  Some foods may be well  tolerated and may help thicken stool including:  Starchy foods, such as rice, toast, pasta, low-sugar cereal, oatmeal, grits, baked potatoes, crackers, and bagels.  Bananas.  Applesauce.  Add probiotic-rich foods to help increase healthy bacteria in the GI tract, such as yogurt and fermented milk products.  Wash your hands well after each diarrhea episode.  Only take over-the-counter or prescription medicines as directed by your caregiver.  Take a warm bath to relieve any burning or pain from frequent diarrhea episodes. SEEK IMMEDIATE MEDICAL CARE IF:   You are unable to keep fluids down.  You have persistent vomiting.  You have blood in your stool, or your stools are black and tarry.  You do not urinate in 6-8 hours, or there is only a small amount of very dark urine.  You have abdominal pain that increases or localizes.  You have weakness, dizziness, confusion, or light-headedness.  You have a severe headache.  Your diarrhea gets worse or does not get better.  You have a fever or persistent symptoms for more than 2-3 days.  You have a fever and your symptoms suddenly get worse. MAKE SURE YOU:   Understand these instructions.  Will watch your condition.  Will get help right away if you are not doing well or get worse. Document Released: 06/18/2002 Document Revised: 11/12/2013 Document Reviewed: 03/05/2012 [facility] Patient Information 2015 [place], [place]. This information is not intended to replace advice given to you by your health care provider. Make sure you discuss any questions you have with your health care provider. Prenatal Care  WHAT IS PRENATAL CARE?  Prenatal care means health care during your pregnancy, before  your baby is born. It is very important to take care of yourself and your baby during your pregnancy by:   Getting early prenatal care. If you know you are pregnant, or think you might be pregnant, call your health care provider as soon as possible.  Schedule a visit for a prenatal exam.  Getting regular prenatal care. Follow your health care provider's schedule for blood and other necessary tests. Do not miss appointments.  Doing everything you can to keep yourself and your baby healthy during your pregnancy.  Getting complete care. Prenatal care should include evaluation of the medical, dietary, educational, psychological, and social needs of you and your significant other. The medical and genetic history of your family and the family of your baby's father should be discussed with your health care provider.  Discussing with your health care provider:  Prescription, over-the-counter, and herbal medicines that you take.  Any history of substance abuse, alcohol use, smoking, and illegal drug use.  Any history of domestic abuse and violence.  Immunizations you have received.  Your nutrition and diet.  The amount of exercise you do.  Any environmental and occupational hazards to which you are exposed.  History of sexually transmitted infections for both you and your partner.  Previous pregnancies you have had. WHY IS PRENATAL CARE SO IMPORTANT?  By regularly seeing your health care provider, you help ensure that problems can be identified early so that they can be treated as soon as possible. Other problems might be prevented. Many studies have shown that early and regular prenatal care is important for the health of mothers and their babies.  HOW CAN I TAKE CARE OF MYSELF WHILE I AM PREGNANT?  Here are ways to take care of yourself and your baby:   Start or continue taking your multivitamin with 400 micrograms (mcg) of folic acid every day.  Get early and regular prenatal care. It is very important to see a health care provider during your pregnancy. Your health care provider will check at each visit to make sure that you and your baby are healthy. If there are any problems, action can be taken right away to help you and your  baby.  Eat a healthy diet that includes:  Fruits.  Vegetables.  Foods low in saturated fat.  Whole grains.  Calcium-rich foods, such as milk, yogurt, and hard cheeses.  Drink 6-8 glasses of liquids a day.  Unless your health care provider tells you not to, try to be physically active for 30 minutes, most days of the week. If you are pressed for time, you can get your activity in through 10-minute segments, three times a day.  Do not smoke, drink alcohol, or use drugs. These can cause long-term damage to your baby. Talk with your health care provider about steps to take to stop smoking. Talk with a member of your faith community, a counselor, a trusted friend, or your health care provider if you are concerned about your alcohol or drug use.  Ask your health care provider before taking any medicine, even over-the-counter medicines. Some medicines are not safe to take during pregnancy.  Get plenty of rest and sleep.  Avoid hot tubs and saunas during pregnancy.  Do not have X-rays taken unless absolutely necessary and with the recommendation of your health care provider. A lead shield can be placed on your abdomen to protect your baby when X-rays are taken in other parts of your body.  Do not empty the cat litter  when you are pregnant. It may contain a parasite that causes an infection called toxoplasmosis, which can cause birth defects. Also, use gloves when working in garden areas used by cats.  Do not eat uncooked or undercooked meats or fish.  Do not eat soft, mold-ripened cheeses (Brie, Camembert, and chevre) or soft, blue-veined cheese (Danish blue and Roquefort).  Stay away from toxic chemicals like:  Insecticides.  Solvents (some cleaners or paint thinners).  Lead.  Mercury.  Sexual intercourse may continue until the end of the pregnancy, unless you have a medical problem or there is a problem with the pregnancy and your health care provider tells you not to.  Do not  wear high-heel shoes, especially during the second half of the pregnancy. You can lose your balance and fall.  Do not take long trips, unless absolutely necessary. Be sure to see your health care provider before going on the trip.  Do not sit in one position for more than 2 hours when on a trip.  Take a copy of your medical records when going on a trip. Know where a hospital is located in the city you are visiting, in case of an emergency.  Most dangerous household products will have pregnancy warnings on their labels. Ask your health care provider about products if you are unsure.  Limit or eliminate your caffeine intake from coffee, tea, sodas, medicines, and chocolate.  Many women continue working through pregnancy. Staying active might help you stay healthier. If you have a question about the safety or the hours you work at your particular job, talk with your health care provider.  Get informed:  Read books.  Watch videos.  Go to childbirth classes for you and your significant other.  Talk with experienced moms.  Ask your health care provider about childbirth education classes for you and your partner. Classes can help you and your partner prepare for the birth of your baby.  Ask about a baby doctor (pediatrician) and methods and pain medicine for labor, delivery, and possible cesarean delivery. HOW OFTEN SHOULD I SEE MY HEALTH CARE PROVIDER DURING PREGNANCY?  Your health care provider will give you a schedule for your prenatal visits. You will have visits more often as you get closer to the end of your pregnancy. An average pregnancy lasts about 40 weeks.  A typical schedule includes visiting your health care provider:   About once each month during your first 6 months of pregnancy.  Every 2 weeks during the next 2 months.  Weekly in the last month, until the delivery date. Your health care provider will probably want to see you more often if:  You are older than 35  years.  Your pregnancy is high risk because you have certain health problems or problems with the pregnancy, such as:  Diabetes.  High blood pressure.  The baby is not growing on schedule, according to the dates of the pregnancy. Your health care provider will do special tests to make sure you and your baby are not having any serious problems. WHAT HAPPENS DURING PRENATAL VISITS?   At your first prenatal visit, your health care provider will do a physical exam and talk to you about your health history and the health history of your partner and your family. Your health care provider will be able to tell you what date to expect your baby to be born on.  Your first physical exam will include checks of your blood pressure, measurements of your height and weight,  and an exam of your pelvic organs. Your health care provider will do a Pap test if you have not had one recently and will do cultures of your cervix to make sure there is no infection.  At each prenatal visit, there will be tests of your blood, urine, blood pressure, weight, and the progress of the baby will be checked.  At your later prenatal visits, your health care provider will check how you are doing and how your baby is developing. You may have a number of tests done as your pregnancy progresses.  Ultrasound exams are often used to check on your baby's growth and health.  You may have more urine and blood tests, as well as special tests, if needed. These may include amniocentesis to examine fluid in the pregnancy sac, stress tests to check how the baby responds to contractions, or a biophysical profile to measure your baby's well-being. Your health care provider will explain the tests and why they are necessary.  You should be tested for high blood sugar (gestational diabetes) between the 24th and 28th weeks of your pregnancy.  You should discuss with your health care provider your plans to breastfeed or bottle-feed your  baby.  Each visit is also a chance for you to learn about staying healthy during pregnancy and to ask questions. Document Released: 07/01/2003 Document Revised: 07/03/2013 Document Reviewed: 09/12/2013 [facility] Patient Information 2015 [place], [place]. This information is not intended to replace advice given to you by your health care provider. Make sure you discuss any questions you have with your health care provider.

## 2015-04-02 ENCOUNTER — Ambulatory Visit (INDEPENDENT_AMBULATORY_CARE_PROVIDER_SITE_OTHER): Payer: Medicaid Other | Admitting: Obstetrics and Gynecology

## 2015-04-02 ENCOUNTER — Encounter: Payer: Self-pay | Admitting: Obstetrics and Gynecology

## 2015-04-02 ENCOUNTER — Other Ambulatory Visit (HOSPITAL_COMMUNITY)
Admission: RE | Admit: 2015-04-02 | Discharge: 2015-04-02 | Disposition: A | Discharge status: Home / Self Care | Payer: No Typology Code available for payment source | Source: Ambulatory Visit | Attending: Obstetrics and Gynecology | Admitting: Obstetrics and Gynecology

## 2015-04-02 VITALS — BP 100/63 | HR 91 | Temp 98.0°F | Wt 147.8 lb

## 2015-04-02 DIAGNOSIS — Z3483 Encounter for supervision of other normal pregnancy, third trimester: Secondary | ICD-10-CM

## 2015-04-02 DIAGNOSIS — B951 Streptococcus, group B, as the cause of diseases classified elsewhere: Secondary | ICD-10-CM

## 2015-04-02 DIAGNOSIS — Z3482 Encounter for supervision of other normal pregnancy, second trimester: Secondary | ICD-10-CM | POA: Insufficient documentation

## 2015-04-02 DIAGNOSIS — Z01419 Encounter for gynecological examination (general) (routine) without abnormal findings: Secondary | ICD-10-CM | POA: Diagnosis present

## 2015-04-02 DIAGNOSIS — Z3492 Encounter for supervision of normal pregnancy, unspecified, second trimester: Secondary | ICD-10-CM

## 2015-04-02 DIAGNOSIS — O2340 Unspecified infection of urinary tract in pregnancy, unspecified trimester: Secondary | ICD-10-CM

## 2015-04-02 DIAGNOSIS — Z98891 History of uterine scar from previous surgery: Secondary | ICD-10-CM

## 2015-04-02 DIAGNOSIS — B955 Unspecified streptococcus as the cause of diseases classified elsewhere: Secondary | ICD-10-CM

## 2015-04-02 DIAGNOSIS — Z1151 Encounter for screening for human papillomavirus (HPV): Secondary | ICD-10-CM | POA: Diagnosis not present

## 2015-04-02 DIAGNOSIS — O2342 Unspecified infection of urinary tract in pregnancy, second trimester: Secondary | ICD-10-CM

## 2015-04-02 DIAGNOSIS — O3421 Maternal care for scar from previous cesarean delivery: Secondary | ICD-10-CM

## 2015-04-02 LAB — POCT URINALYSIS DIP (DEVICE)
Bilirubin Urine: NEGATIVE
Glucose, UA: NEGATIVE mg/dL
KETONES UR: NEGATIVE mg/dL
Nitrite: NEGATIVE
PH: 6.5 (ref 5.0–8.0)
Protein, ur: NEGATIVE mg/dL
SPECIFIC GRAVITY, URINE: 1.025 (ref 1.005–1.030)
Urobilinogen, UA: 1 mg/dL (ref 0.0–1.0)

## 2015-04-02 MED ORDER — DOXYLAMINE-PYRIDOXINE 10-10 MG PO TBEC: 1.0000 | DELAYED_RELEASE_TABLET | Freq: Two times a day (BID) | ORAL | Status: DC

## 2015-04-02 MED ORDER — PROMETHAZINE HCL 12.5 MG PO TABS: 12.5000 mg | ORAL_TABLET | Freq: Four times a day (QID) | ORAL | Status: DC | PRN

## 2015-04-02 NOTE — Patient Instructions (Addendum)
Second Trimester of Pregnancy The second trimester is from week 13 through week 28, months 4 through 6. The second trimester is often a time when you feel your best. Your body has also adjusted to being pregnant, and you begin to feel better physically. Usually, morning sickness has lessened or quit completely, you may have more energy, and you may have an increase in appetite. The second trimester is also a time when the fetus is growing rapidly. At the end of the sixth month, the fetus is about 9 inches long and weighs about 1 pounds. You will likely begin to feel the baby move (quickening) between 18 and 20 weeks of the pregnancy. BODY CHANGES Your body goes through many changes during pregnancy. The changes vary from woman to woman.   Your weight will continue to increase. You will notice your lower abdomen bulging out.  You may begin to get stretch marks on your hips, abdomen, and breasts.  You may develop headaches that can be relieved by medicines approved by your health care provider.  You may urinate more often because the fetus is pressing on your bladder.  You may develop or continue to have heartburn as a result of your pregnancy.  You may develop constipation because certain hormones are causing the muscles that push waste through your intestines to slow down.  You may develop hemorrhoids or swollen, bulging veins (varicose veins).  You may have back pain because of the weight gain and pregnancy hormones relaxing your joints between the bones in your pelvis and as a result of a shift in weight and the muscles that support your balance.  Your breasts will continue to grow and be tender.  Your gums may bleed and may be sensitive to brushing and flossing.  Dark spots or blotches (chloasma, mask of pregnancy) may develop on your face. This will likely fade after the baby is born.  A dark line from your belly button to the pubic area (linea nigra) may appear. This will likely  fade after the baby is born.  You may have changes in your hair. These can include thickening of your hair, rapid growth, and changes in texture. Some women also have hair loss during or after pregnancy, or hair that feels dry or thin. Your hair will most likely return to normal after your baby is born. WHAT TO EXPECT AT YOUR PRENATAL VISITS During a routine prenatal visit:  You will be weighed to make sure you and the fetus are growing normally.  Your blood pressure will be taken.  Your abdomen will be measured to track your baby's growth.  The fetal heartbeat will be listened to.  Any test results from the previous visit will be discussed. Your health care provider may ask you:  How you are feeling.  If you are feeling the baby move.  If you have had any abnormal symptoms, such as leaking fluid, bleeding, severe headaches, or abdominal cramping.  If you have any questions. Other tests that may be performed during your second trimester include:  Blood tests that check for:  Low iron levels (anemia).  Gestational diabetes (between 24 and 28 weeks).  Rh antibodies.  Urine tests to check for infections, diabetes, or protein in the urine.  An ultrasound to confirm the proper growth and development of the baby.  An amniocentesis to check for possible genetic problems.  Fetal screens for spina bifida and Down syndrome. HOME CARE INSTRUCTIONS   Avoid all smoking, herbs, alcohol, and unprescribed   drugs. These chemicals affect the formation and growth of the baby.  Follow your health care provider's instructions regarding medicine use. There are medicines that are either safe or unsafe to take during pregnancy.  Exercise only as directed by your health care provider. Experiencing uterine cramps is a good sign to stop exercising.  Continue to eat regular, healthy meals.  Wear a good support bra for breast tenderness.  Do not use hot tubs, steam rooms, or saunas.  Wear  your seat belt at all times when driving.  Avoid raw meat, uncooked cheese, cat litter boxes, and soil used by cats. These carry germs that can cause birth defects in the baby.  Take your prenatal vitamins.  Try taking a stool softener (if your health care provider approves) if you develop constipation. Eat more high-fiber foods, such as fresh vegetables or fruit and whole grains. Drink plenty of fluids to keep your urine clear or pale yellow.  Take warm sitz baths to soothe any pain or discomfort caused by hemorrhoids. Use hemorrhoid cream if your health care provider approves.  If you develop varicose veins, wear support hose. Elevate your feet for 15 minutes, 3-4 times a day. Limit salt in your diet.  Avoid heavy lifting, wear low heel shoes, and practice good posture.  Rest with your legs elevated if you have leg cramps or low back pain.  Visit your dentist if you have not gone yet during your pregnancy. Use a soft toothbrush to brush your teeth and be gentle when you floss.  A sexual relationship may be continued unless your health care provider directs you otherwise.  Continue to go to all your prenatal visits as directed by your health care provider. SEEK MEDICAL CARE IF:   You have dizziness.  You have mild pelvic cramps, pelvic pressure, or nagging pain in the abdominal area.  You have persistent nausea, vomiting, or diarrhea.  You have a bad smelling vaginal discharge.  You have pain with urination. SEEK IMMEDIATE MEDICAL CARE IF:   You have a fever.  You are leaking fluid from your vagina.  You have spotting or bleeding from your vagina.  You have severe abdominal cramping or pain.  You have rapid weight gain or loss.  You have shortness of breath with chest pain.  You notice sudden or extreme swelling of your face, hands, ankles, feet, or legs.  You have not felt your baby move in over an hour.  You have severe headaches that do not go away with  medicine.  You have vision changes. Document Released: 06/22/2001 Document Revised: 07/03/2013 Document Reviewed: 08/29/2012 [employer] Patient Information 2015 [employer]. This information is not intended to replace advice given to you by your health care provider. Make sure you discuss any questions you have with your health care provider.  Trial of Labor After Cesarean Delivery Information A trial of labor after cesarean delivery (TOLAC) is when a woman tries to give birth vaginally after a previous cesarean delivery. TOLAC may be a safe and appropriate option for you depending on your medical history and other risk factors. When TOLAC is successful and you are able to have a vaginal delivery, this is called a vaginal birth after cesarean delivery (VBAC).  CANDIDATES FOR TOLAC TOLAC is possible for some women who:  Have undergone one or two prior cesarean deliveries in which the incision of the uterus was horizontal (low transverse).  Are carrying twins and have had one prior low transverse incision during a cesarean   delivery.  Do not have a vertical (classical) uterine scar.  Have not had a tear in the wall of their uterus (uterine rupture). TOLAC is also supported for women who meet appropriate criteria and:  Are under the age of 40 years.  Are tall and have a body mass index (BMI) of less than 30.  Have an unknown uterine scar.  Give birth in a facility equipped to handle an emergency cesarean delivery. This team should be able to handle possible complications such as a uterine rupture.  Have thorough counseling about the benefits and risks of TOLAC.  Have discussed future pregnancy plans with their health care provider.  Plan to have several more pregnancies. MOST SUCCESSFUL CANDIDATES FOR TOLAC:  Have had a successful vaginal delivery before or after their cesarean delivery.  Experience labor that begins naturally on or before the due date (40 weeks of  gestation).  Do not have a very large (macrosomic) baby.   Had a prior cesarean delivery but are not currently experiencing factors that would prompt a cesarean delivery (such as a breech position).  Had only one prior cesarean delivery.  Had a prior cesarean delivery that was performed early in labor and not after full cervical dilation. TOLAC may be most appropriate for women who meet the above guidelines and who plan to have more pregnancies. TOLAC is not recommended for home births. LEAST SUCCESSFUL CANDIDATES FOR TOLAC:  Have an induced labor with an unfavorable cervix. An unfavorable cervix is when the cervix is not dilating enough (among other factors).  Have never had a vaginal delivery.  Have had more than two cesarean deliveries.  Have a pregnancy at more than 40 weeks of gestation.  Are pregnant with a baby with a suspected weight greater than 4,000 grams (8 pounds) and who have no prior history of a vaginal delivery.  Have closely spaced pregnancies. SUGGESTED BENEFITS OF TOLAC  You may have a faster recovery time.  You may have a shorter stay in the hospital.  You may have less pain and fewer problems than with a cesarean delivery. Women who have a cesarean delivery have a higher chance of needing blood or getting a fever, an infection, or a blood clot in the legs. SUGGESTED RISKS OF TOLAC The highest risk of complications happens to women who attempt a TOLAC and fail. A failed TOLAC results in an unplanned cesarean delivery. Risks related to TOLAC or repeat cesarean deliveries include:   Blood loss.  Infection.  Blood clot.  Injury to surrounding tissues or organs.  Having to remove the uterus (hysterectomy).  Potential problems with the placenta (such as placenta previa or placenta accreta) in future pregnancies. Although very rare, the main concerns with TOLAC are:  Rupture of the uterine scar from a past cesarean delivery.  Needing an emergency  cesarean delivery.  Having a bad outcome for the baby (perinatal morbidity). FOR MORE INFORMATION [employer] of Obstetricians [employer] Gynec[employer]: www.acog.org [employer] College of Nurse-Midwives: www.midwife.org Document Released: 03/16/2011 Document Revised: 04/18/2013 Document Reviewed: 12/18/2012 [employer] Patient Information 2015 [employer]. This information is not intended to replace advice given to you by your health care provider. Make sure you discuss any questions you have with your health care provider.  

## 2015-04-02 NOTE — Progress Notes (Signed)
Subjective:    Miranda Bowen is a [id number] [redacted]w[redacted]d being seen today for her first obstetrical visit.  Her obstetrical history is significant for none. Patient does intend to breast feed. Pregnancy history fully reviewed. No hx HTN.  Patient reports nausea and some vomiting. Reports occ substernal chest pains lasting a couple seconds, mostly after eating spicey foods. Seen in [facility]U for pre-syncope, but that has resolved. Does not want to know sex of fetus. Works in [employer].  Had 6wk [place] confirming LMP  Filed Vitals:   04/02/15 0902 04/02/15 0914  BP: 131/117 100/63  Pulse: 91   Temp: 98 F (36.7 C)   Weight: 147 lb 12.8 oz (67.042 kg)     HISTORY: OB History  Gravida Para Term Preterm AB SAB TAB Ectopic Multiple Living  0 0 0 0 0  2    # Outcome Date GA Lbr Len/2nd Weight Sex Delivery Anes PTL Lv  3 Current           2 Term 03/26/14 [redacted]w[redacted]d  7 lb 6 oz (3.345 kg) M CS-LTranv EPI  Y  1 Term 2006 [redacted]w[redacted]d  7 lb 12 oz (3.515 kg) M CS-Unspec Spinal  Y     Comments: c/s " because he flipped over", no other complications except gained too much weight. Born [facility]     Past Medical History  Diagnosis Date  . No pertinent past medical history   . Medical history non-contributory    Past Surgical History  Procedure Laterality Date  . Cesarean section N/A 03/26/2014    Procedure: CESAREAN SECTION;  Surgeon: Allie Bossier, MD;  Location: [facility];  Service: Obstetrics;  Laterality: N/A;  . Cesarean section     Family History  Problem Relation Age of Onset  . Hypertension Mother      Exam    Uterus:   S=D, FHR 150  Pelvic Exam:    Perineum: No Hemorrhoids, Normal Perineum   Vulva: normal   Vagina:  normal mucosa, normal discharge       Cervix: multiparous appearance, no bleeding following Pap and no lesions   Adnexa: not evaluated   Bony Pelvis: average  System: Breast:  normal appearance, no masses or tenderness   Skin: normal coloration and turgor, no rashes    Neurologic:  oriented, normal, grossly non-focal   Extremities: normal strength, tone, and muscle mass   HEENT PERRLA, thyroid without masses and trachea midline   Mouth/Teeth mucous membranes moist, pharynx normal without lesions and dental hygiene good   Neck supple and no masses   Cardiovascular: regular rate and rhythm, no murmurs or gallops   Respiratory:  appears well, vitals normal, no respiratory distress, acyanotic, normal RR, ear and throat exam is normal, neck free of mass or lymphadenopathy, chest clear, no wheezing, crepitations, rhonchi, normal symmetric air entry   Abdomen: soft, non-tender; bowel sounds normal; no masses,  no organomegaly   Urinary: urethral meatus normal      Assessment:    Pregnancy: [id number] Patient Active Problem List   Diagnosis Date Noted  . H/O cesarean section 04/02/2015  . Supervision of normal intrauterine pregnancy in multigravida in second trimester 04/02/2015    N/V of pregnancy    Plan:     Initial labs drawn. Declines flu Prenatal vitamins. Problem list reviewed and updated. Genetic Screening discussed Quad Screen: declined.  Ultrasound discussed; fetal survey: ordered. 05/07/15 Rx phenergan and Diclegis. Try OTC heartburn meds.  Information on  TOLAC    Follow up in 4 weeks. 50% of 30 min visit spent on counseling and coordination of care POE,DEIRDRE 04/02/2015

## 2015-04-02 NOTE — Progress Notes (Signed)
Here for first visit. Given new patient information.  Declines flu shot. C/o some chest pains at times - will discuss with provider.

## 2015-04-03 LAB — PRESCRIPTION MONITORING PROFILE (19 PANEL)
Amphetamine/Meth: NEGATIVE ng/mL
Barbiturate Screen, Urine: NEGATIVE ng/mL
Benzodiazepine Screen, Urine: NEGATIVE ng/mL
Buprenorphine, Urine: NEGATIVE ng/mL
CARISOPRODOL, URINE: NEGATIVE ng/mL
COCAINE METABOLITES: NEGATIVE ng/mL
Cannabinoid Scrn, Ur: NEGATIVE ng/mL
Creatinine, Urine: 260.02 mg/dL (ref >20.0)
FENTANYL URINE: NEGATIVE ng/mL
MDMA URINE: NEGATIVE ng/mL
MEPERIDINE UR: NEGATIVE ng/mL
METHADONE SCREEN, URINE: NEGATIVE ng/mL
Methaqualone: NEGATIVE ng/mL
Nitrites, Initial: NEGATIVE ug/mL
OXYCODONE SCRN UR: NEGATIVE ng/mL
Opiate Screen, Urine: NEGATIVE ng/mL
PHENCYCLIDINE, UR: NEGATIVE ng/mL
Propoxyphene: NEGATIVE ng/mL
Tapentadol, urine: NEGATIVE ng/mL
Tramadol Scrn, Ur: NEGATIVE ng/mL
Zolpidem, Urine: NEGATIVE ng/mL
pH, Initial: 6.6 pH (ref 4.5–8.9)

## 2015-04-03 LAB — RUBELLA SCREEN: Rubella: 1.82 Index — ABNORMAL HIGH (ref <0.90)

## 2015-04-03 LAB — HEPATITIS B SURFACE ANTIGEN: Hepatitis B Surface Ag: NEGATIVE

## 2015-04-04 LAB — CYTOLOGY - PAP

## 2015-04-05 LAB — CULTURE, OB URINE

## 2015-04-14 ENCOUNTER — Telehealth: Payer: Self-pay | Admitting: *Deleted

## 2015-04-14 DIAGNOSIS — O2340 Unspecified infection of urinary tract in pregnancy, unspecified trimester: Secondary | ICD-10-CM

## 2015-04-14 DIAGNOSIS — B955 Unspecified streptococcus as the cause of diseases classified elsewhere: Secondary | ICD-10-CM | POA: Insufficient documentation

## 2015-04-14 DIAGNOSIS — B951 Streptococcus, group B, as the cause of diseases classified elsewhere: Secondary | ICD-10-CM

## 2015-04-14 MED ORDER — AMOXICILLIN 500 MG PO CAPS: 500.0000 mg | ORAL_CAPSULE | Freq: Three times a day (TID) | ORAL | Status: AC

## 2015-04-14 NOTE — Telephone Encounter (Signed)
  Bernita Buffy CNM, sent message to order Amoxicillin  TID for 10d for strep bacteruria.  Ordered  Contacted patient, urine results given with recommendation of antibiotics.  Pt verbalizes understanding and has no further questions.

## 2015-04-30 ENCOUNTER — Ambulatory Visit (INDEPENDENT_AMBULATORY_CARE_PROVIDER_SITE_OTHER): Payer: Medicaid Other | Admitting: Advanced Practice Midwife

## 2015-04-30 VITALS — BP 113/75 | HR 71 | Temp 97.5°F | Wt 150.7 lb

## 2015-04-30 DIAGNOSIS — Z3482 Encounter for supervision of other normal pregnancy, second trimester: Secondary | ICD-10-CM

## 2015-04-30 LAB — POCT URINALYSIS DIP (DEVICE)
Bilirubin Urine: NEGATIVE
Glucose, UA: NEGATIVE mg/dL
Ketones, ur: NEGATIVE mg/dL
Leukocytes, UA: NEGATIVE
Nitrite: NEGATIVE
Protein, ur: NEGATIVE mg/dL
Specific Gravity, Urine: >=1.03 (ref 1.005–1.030)
Urobilinogen, UA: 0.2 mg/dL (ref 0.0–1.0)
pH: 6 (ref 5.0–8.0)

## 2015-04-30 NOTE — Progress Notes (Signed)
Subjective:  Miranda Bowen is a 29 y.o. G3P2002 at 4770w0d being seen today for ongoing prenatal care.  Patient reports nausea.  Contractions: Not present.  Vag. Bleeding: None. Movement: Present. Denies leaking of fluid.   The following portions of the patient's history were reviewed and updated as appropriate: allergies, current medications, past family history, past medical history, past social history, past surgical history and problem list. Problem list updated.  Objective:   Filed Vitals:   04/30/15 0918  BP: 113/75  Pulse: 71  Temp: 97.5 F (36.4 C)  Weight: 150 lb 11.2 oz (68.357 kg)    Fetal Status: Fetal Heart Rate (bpm): 139 Fundal Height: 18 cm Movement: Present     General:  Alert, oriented and cooperative. Patient is in no acute distress.  Skin: Skin is warm and dry. No rash noted.   Cardiovascular: Normal heart rate noted  Respiratory: Normal respiratory effort, no problems with respiration noted  Abdomen: Soft, gravid, appropriate for gestational age. Pain/Pressure: Absent     Pelvic: Vag. Bleeding: None     Cervical exam deferred        Extremities: Normal range of motion.  Edema: None  Mental Status: Normal mood and affect. Normal behavior. Normal judgment and thought content.   Urinalysis: Urine Protein: Negative Urine Glucose: Negative  Assessment and Plan:  Pregnancy: G3P2002 at 3070w0d  1. Supervision of normal intrauterine pregnancy in multigravida in second trimester   Preterm labor symptoms and general obstetric precautions including but not limited to vaginal bleeding, contractions, leaking of fluid and fetal movement were reviewed in detail with the patient.  Pt managing nausea well with diet/medications, declines need for additional treatment. Please refer to After Visit Summary for other counseling recommendations.  Return in about 4 weeks (around 05/28/2015).   Hurshel PartyLisa A Leftwich-Kirby, CNM

## 2015-04-30 NOTE — Progress Notes (Signed)
Pt reports belly tightening; c/o heartburn

## 2015-05-01 LAB — ANTIBODY SCREEN: Antibody Screen: NEGATIVE

## 2015-05-06 ENCOUNTER — Encounter (HOSPITAL_COMMUNITY): Payer: Self-pay | Admitting: *Deleted

## 2015-05-06 ENCOUNTER — Inpatient Hospital Stay (HOSPITAL_COMMUNITY)
Admission: AD | Admit: 2015-05-06 | Discharge: 2015-05-06 | Disposition: A | Discharge status: Home / Self Care | Payer: Medicaid Other | Source: Ambulatory Visit | Attending: Obstetrics & Gynecology | Admitting: Obstetrics & Gynecology

## 2015-05-06 DIAGNOSIS — A084 Viral intestinal infection, unspecified: Secondary | ICD-10-CM | POA: Diagnosis not present

## 2015-05-06 DIAGNOSIS — O210 Mild hyperemesis gravidarum: Secondary | ICD-10-CM | POA: Diagnosis present

## 2015-05-06 DIAGNOSIS — Z3A18 18 weeks gestation of pregnancy: Secondary | ICD-10-CM | POA: Diagnosis not present

## 2015-05-06 DIAGNOSIS — O99612 Diseases of the digestive system complicating pregnancy, second trimester: Secondary | ICD-10-CM | POA: Diagnosis not present

## 2015-05-06 DIAGNOSIS — O219 Vomiting of pregnancy, unspecified: Secondary | ICD-10-CM | POA: Diagnosis not present

## 2015-05-06 LAB — URINALYSIS, ROUTINE W REFLEX MICROSCOPIC
Bilirubin Urine: NEGATIVE
Glucose, UA: NEGATIVE mg/dL
HGB URINE DIPSTICK: NEGATIVE
Ketones, ur: NEGATIVE mg/dL
Leukocytes, UA: NEGATIVE
Nitrite: NEGATIVE
PH: 7.5 (ref 5.0–8.0)
Protein, ur: NEGATIVE mg/dL
SPECIFIC GRAVITY, URINE: 1.02 (ref 1.005–1.030)
UROBILINOGEN UA: 0.2 mg/dL (ref 0.0–1.0)

## 2015-05-06 NOTE — MAU Provider Note (Signed)
History     CSN: [id number]  Arrival date and time: 05/06/15 1108   First Provider Initiated Contact with Patient 05/06/15 1320      Chief Complaint  Patient presents with  . Emesis   HPI  Pt is 4332w6d G3P2002 who presents with vomiting in pregnancy.  Pt stared vomiting at 5 am this morning .  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  NO diarrhea, chills fever,burning- denies spotting, bleeding, spotting.  Pt has back pain with pregnancy- has previously taken muscle relaxant with previous pregnancy. Pt has phenergan that she took this morning after she threw up the 1st time at 5 or 5:15 this morning. 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 Threw up last night and then a few times since she got here.       Past Medical History  Diagnosis Date  . No pertinent past medical history   . Medical history non-contributory     Past Surgical History  Procedure Laterality Date  . Cesarean section N/A 03/26/2014    Procedure: CESAREAN SECTION;  Surgeon: Allie BossierMyra C Dove, MD;  Location: [facility];  Service: Obstetrics;  Laterality: N/A;  . Cesarean section      Family History  Problem Relation Age of Onset  . Hypertension Mother     Social History  Substance Use Topics  . Smoking status: Never Smoker   . Smokeless tobacco: Never Used  . Alcohol Use: No    Allergies: No Known Allergies  Prescriptions prior to admission  Medication Sig Dispense Refill Last Dose  . Doxylamine-Pyridoxine (DICLEGIS) 10-10 MG TBEC Take 1 tablet by mouth 2 (two) times daily. 60 tablet 0 Taking  . Prenatal Multivit-Min-Fe-FA (PRENATAL VITAMINS) 0.8 MG tablet Take 1 tablet by mouth daily. 30 tablet 12 Taking  . promethazine (PHENERGAN) 12.5 MG tablet Take 1 tablet (12.5 mg total) by mouth every 6 (six) hours as needed for nausea or vomiting. (Patient not taking: Reported on 04/30/2015) 30 tablet 0 Not  Taking    Review of Systems  Constitutional: Negative for fever and chills.  Gastrointestinal: Positive for nausea and vomiting. Negative for abdominal pain, diarrhea and constipation.  Genitourinary: Negative for dysuria.  Musculoskeletal: Negative for back pain.  Neurological: Negative for headaches.   Physical Exam   Blood pressure 106/68, pulse 90, temperature 98.5 F (36.9 C), temperature source Oral, resp. rate 18, weight 151 lb 9.6 oz (68.765 kg), last menstrual period 12/25/2014, not currently breastfeeding.  Physical Exam  Nursing note and vitals reviewed. Constitutional: She is oriented to person, place, and time. She appears well-developed and well-nourished. No distress.  HENT:  Head: Normocephalic.  Eyes: Pupils are equal, round, and reactive to light.  Neck: Normal range of motion. Neck supple.  Cardiovascular: Normal rate.   Respiratory: Effort normal.  No CVA tenderness  GI: Soft. She exhibits no distension. There is no tenderness. There is no rebound.  FHR 146 with doppler  Musculoskeletal: Normal range of motion.  Neurological: She is alert and oriented to person, place, and time.  Skin: Skin is warm and dry.  Psychiatric: She has a normal mood and affect.    MAU Course  Procedures Results for orders placed or performed during the hospital encounter of 05/06/15 (from the past 24 hour(s))  Urinalysis, Routine w reflex microscopic ([facility])  Status: None   Collection Time: 05/06/15 11:25 AM  Result Value Ref Range   Color, Urine YELLOW YELLOW   APPearance CLEAR CLEAR   Specific Gravity, Urine 1.020 1.005 - 1.030   pH 7.5 5.0 - 8.0   Glucose, UA NEGATIVE NEGATIVE mg/dL   Hgb urine dipstick NEGATIVE NEGATIVE   Bilirubin Urine NEGATIVE NEGATIVE   Ketones, ur NEGATIVE NEGATIVE mg/dL   Protein, ur NEGATIVE NEGATIVE mg/dL   Urobilinogen, UA 0.2 0.0 - 1.0 mg/dL   Nitrite NEGATIVE NEGATIVE   Leukocytes, UA NEGATIVE NEGATIVE  FHR 147 bpm Pt declines  any medication at this time and wants to go home and rest  Assessment and Plan  Viral gastroenteritis- BRAT diet in 2nd trimester of pregnancy Note for work F/u with [place] appointment scheduled tomorrow  The OB appointment in [facility] clinic  Sade Hollon 05/06/2015, 1:21 PM

## 2015-05-06 NOTE — MAU Note (Signed)
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 Threw up last night and then a few times since she got here.

## 2015-05-07 ENCOUNTER — Ambulatory Visit (HOSPITAL_COMMUNITY)
Admission: RE | Admit: 2015-05-07 | Discharge: 2015-05-07 | Disposition: A | Discharge status: Home / Self Care | Payer: Medicaid Other | Source: Ambulatory Visit | Attending: Neurosurgery | Admitting: Neurosurgery

## 2015-05-07 ENCOUNTER — Other Ambulatory Visit: Payer: Self-pay | Admitting: Obstetrics and Gynecology

## 2015-05-07 DIAGNOSIS — O34219 Maternal care for unspecified type scar from previous cesarean delivery: Secondary | ICD-10-CM | POA: Diagnosis not present

## 2015-05-07 DIAGNOSIS — Z3A19 19 weeks gestation of pregnancy: Secondary | ICD-10-CM | POA: Diagnosis not present

## 2015-05-07 DIAGNOSIS — Z3482 Encounter for supervision of other normal pregnancy, second trimester: Secondary | ICD-10-CM

## 2015-05-07 DIAGNOSIS — Z3492 Encounter for supervision of normal pregnancy, unspecified, second trimester: Secondary | ICD-10-CM

## 2015-05-07 DIAGNOSIS — Z36 Encounter for antenatal screening of mother: Secondary | ICD-10-CM | POA: Diagnosis present

## 2015-05-07 DIAGNOSIS — Z1389 Encounter for screening for other disorder: Secondary | ICD-10-CM

## 2015-05-07 DIAGNOSIS — O09892 Supervision of other high risk pregnancies, second trimester: Secondary | ICD-10-CM | POA: Diagnosis not present

## 2015-05-28 ENCOUNTER — Encounter: Payer: Self-pay | Admitting: Physician Assistant

## 2015-05-28 ENCOUNTER — Ambulatory Visit (INDEPENDENT_AMBULATORY_CARE_PROVIDER_SITE_OTHER): Payer: Medicaid Other | Admitting: Physician Assistant

## 2015-05-28 VITALS — BP 110/73 | HR 97 | Wt 158.8 lb

## 2015-05-28 DIAGNOSIS — Z3482 Encounter for supervision of other normal pregnancy, second trimester: Secondary | ICD-10-CM | POA: Diagnosis not present

## 2015-05-28 LAB — POCT URINALYSIS DIP (DEVICE)
Bilirubin Urine: NEGATIVE
Glucose, UA: NEGATIVE mg/dL
Ketones, ur: NEGATIVE mg/dL
LEUKOCYTES UA: NEGATIVE
NITRITE: NEGATIVE
Protein, ur: NEGATIVE mg/dL
Specific Gravity, Urine: >=1.03 (ref 1.005–1.030)
UROBILINOGEN UA: 0.2 mg/dL (ref 0.0–1.0)
pH: 6 (ref 5.0–8.0)

## 2015-05-28 NOTE — Patient Instructions (Signed)

## 2015-05-28 NOTE — Progress Notes (Signed)
Subjective:  Miranda Bowen is a 29 y.o. G3P2002 at 1366w0d being seen today for ongoing prenatal care.  Patient reports no complaints.  Contractions: Not present.  Vag. Bleeding: None. Movement: Present. Denies leaking of fluid.   The following portions of the patient's history were reviewed and updated as appropriate: allergies, current medications, past family history, past medical history, past social history, past surgical history and problem list.   Objective:   Filed Vitals:   05/28/15 0822  BP: 110/73  Pulse: 97  Weight: 158 lb 12.8 oz (72.031 kg)    Fetal Status: Fetal Heart Rate (bpm): 142   Movement: Present     General:  Alert, oriented and cooperative. Patient is in no acute distress.  Skin: Skin is warm and dry. No rash noted.   Cardiovascular: Normal heart rate noted  Respiratory: Normal respiratory effort, no problems with respiration noted  Abdomen: Soft, gravid, appropriate for gestational age. Pain/Pressure: Present     Pelvic: Vag. Bleeding: None     Cervical exam deferred        Extremities: Normal range of motion.  Edema: None  Mental Status: Normal mood and affect. Normal behavior. Normal judgment and thought content.   Urinalysis:      Assessment and Plan:  Pregnancy: G3P2002 at 2066w0d  1. Supervision of normal intrauterine pregnancy in multigravida in second trimester Continue PNV daily   Preterm labor symptoms and general obstetric precautions including but not limited to vaginal bleeding, contractions, leaking of fluid and fetal movement were reviewed in detail with the patient. Please refer to After Visit Summary for other counseling recommendations.  Return in about 4 weeks (around 06/25/2015) for LOB.   Bertram DenverKaren E Teague Clark, PA-C

## 2015-06-25 ENCOUNTER — Ambulatory Visit (INDEPENDENT_AMBULATORY_CARE_PROVIDER_SITE_OTHER): Payer: Medicaid Other | Admitting: Certified Nurse Midwife

## 2015-06-25 VITALS — BP 126/68 | HR 90 | Temp 98.0°F | Wt 147.0 lb

## 2015-06-25 DIAGNOSIS — Z3482 Encounter for supervision of other normal pregnancy, second trimester: Secondary | ICD-10-CM

## 2015-06-25 LAB — POCT URINALYSIS DIP (DEVICE)
Bilirubin Urine: NEGATIVE
Glucose, UA: NEGATIVE mg/dL
Ketones, ur: NEGATIVE mg/dL
Leukocytes, UA: NEGATIVE
Nitrite: NEGATIVE
Protein, ur: 30 mg/dL — AB
Specific Gravity, Urine: >=1.03 (ref 1.005–1.030)
Urobilinogen, UA: 0.2 mg/dL (ref 0.0–1.0)
pH: 6 (ref 5.0–8.0)

## 2015-06-25 NOTE — Progress Notes (Signed)
Subjective:  Miranda Bowen is a 29 y.o. G3P2002 at 6711w0d being seen today for ongoing prenatal care.  She is currently monitored for the following issues for this low-risk pregnancy and has H/O cesarean section; Supervision of normal intrauterine pregnancy in multigravida in second trimester; and Beta hemolytic Streptococcus UTI complicating pregnancy, antepartum on her problem list.  Patient reports no complaints.  Contractions: Not present. Vag. Bleeding: None.  Movement: Present. Denies leaking of fluid.   The following portions of the patient's history were reviewed and updated as appropriate: allergies, current medications, past family history, past medical history, past social history, past surgical history and problem list. Problem list updated.  Objective:   Filed Vitals:   06/25/15 0814  BP: 126/68  Pulse: 90  Temp: 98 F (36.7 C)  Weight: 147 lb (66.679 kg)    Fetal Status:     Movement: Present     General:  Alert, oriented and cooperative. Patient is in no acute distress.  Skin: Skin is warm and dry. No rash noted.   Cardiovascular: Normal heart rate noted  Respiratory: Normal respiratory effort, no problems with respiration noted  Abdomen: Soft, gravid, appropriate for gestational age. Pain/Pressure: Present     Pelvic: Vag. Bleeding: None     Cervical exam deferred        Extremities: Normal range of motion.  Edema: None  Mental Status: Normal mood and affect. Normal behavior. Normal judgment and thought content.   Urinalysis:      Assessment and Plan:  Pregnancy: G3P2002 at 3311w0d  There are no diagnoses linked to this encounter. Preterm labor symptoms and general obstetric precautions including but not limited to vaginal bleeding, contractions, leaking of fluid and fetal movement were reviewed in detail with the patient. Please refer to After Visit Summary for other counseling recommendations.  Return in about 2 weeks (around 07/09/2015) for 1 hour  gtt.   Rhea PinkLori A Durwin Davisson, CNM

## 2015-06-25 NOTE — Patient Instructions (Signed)
Glucose Tolerance Test During Pregnancy The glucose tolerance test (GTT) is a blood test used to determine if you have developed a type of diabetes during pregnancy (gestational diabetes). This is when your body does not properly process sugar (glucose) in the food you eat, resulting in high blood glucose levels. Typically, a GTT is done after you have had a 1-hour glucose test with results that indicate you possibly have gestational diabetes. It may also be done if:  You have a history of giving birth to very large babies or have experienced repeated fetal loss (stillbirth).   You have signs and symptoms of diabetes, such as:   Changes in your vision.   Tingling or numbness in your hands or feet.   Changes in hunger, thirst, and urination not otherwise explained by your pregnancy.  The GTT lasts about 3 hours. You will be given a sugar-water solution to drink at the beginning of the test. You will have blood drawn before you drink the solution and then again 1, 2, and 3 hours after you drink it. You will not be allowed to eat or drink anything else during the test. You must remain at the testing location to make sure that your blood is drawn on time. You should also avoid exercising during the test, because exercise can alter test results. PREPARATION FOR TEST  Eat normally for 3 days prior to the GTT test, including having plenty of carbohydrate-rich foods. Do not eat or drink anything except water during the final 12 hours before the test. In addition, your health care provider may ask you to stop taking certain medicines before the test. RESULTS  It is your responsibility to obtain your test results. Ask the lab or department performing the test when and how you will get your results. Contact your health care provider to discuss any questions you have about your results.  Range of Normal Values Ranges for normal values may vary among different labs and hospitals. You should always check  with your health care provider after having lab work or other tests done to discuss whether your values are considered within normal limits. Normal levels of blood glucose are as follows:  Fasting: less than 105 mg/dL.   1 hour after drinking the solution: less than 190 mg/dL.   2 hours after drinking the solution: less than 165 mg/dL.   3 hours after drinking the solution: less than 145 mg/dL.  Some substances can interfere with GTT results. These may include:  Blood pressure and heart failure medicines, including beta blockers, furosemide, and thiazides.   Anti-inflammatory medicines, including aspirin.   Nicotine.   Some psychiatric medicines.  Meaning of Results Outside Normal Value Ranges GTT test results that are above normal values may indicate a number of health problems, such as:   Gestational diabetes.   Acute stress response.   Cushing syndrome.   Tumors such as pheochromocytoma or glucagonoma.   Long-term kidney problems.   Pancreatitis.   Hyperthyroidism.   Current infection.  Discuss your test results with your health care provider. He or she will use the results to make a diagnosis and determine a treatment plan that is right for you.   This information is not intended to replace advice given to you by your health care provider. Make sure you discuss any questions you have with your health care provider.   Document Released: 12/28/2011 Document Revised: 07/19/2014 Document Reviewed: 11/02/2013 Elsevier Interactive Patient Education 2016 [employer] Inc.  

## 2015-06-25 NOTE — Progress Notes (Signed)
Pt decline TDAP at this time

## 2015-06-25 NOTE — Addendum Note (Signed)
Addended by: Cheree DittoGRAHAM, DEMETRICE A on: 06/25/2015 09:31 AM   Modules accepted: Orders

## 2015-07-09 ENCOUNTER — Ambulatory Visit (INDEPENDENT_AMBULATORY_CARE_PROVIDER_SITE_OTHER): Payer: Medicaid Other | Admitting: Advanced Practice Midwife

## 2015-07-09 VITALS — BP 120/74 | HR 90 | Temp 98.2°F | Wt 168.5 lb

## 2015-07-09 DIAGNOSIS — Z3482 Encounter for supervision of other normal pregnancy, second trimester: Secondary | ICD-10-CM | POA: Diagnosis present

## 2015-07-09 DIAGNOSIS — Z3492 Encounter for supervision of normal pregnancy, unspecified, second trimester: Secondary | ICD-10-CM

## 2015-07-09 LAB — CBC
HCT: 33.8 % — ABNORMAL LOW (ref 36.0–46.0)
HEMOGLOBIN: 11.3 g/dL — AB (ref 12.0–15.0)
MCH: 29.7 pg (ref 26.0–34.0)
MCHC: 33.4 g/dL (ref 30.0–36.0)
MCV: 88.7 fL (ref 78.0–100.0)
MPV: 8.7 fL (ref 8.6–12.4)
Platelets: 337 10*3/uL (ref 150–400)
RBC: 3.81 MIL/uL — ABNORMAL LOW (ref 3.87–5.11)
RDW: 12.8 % (ref 11.5–15.5)
WBC: 10.5 10*3/uL (ref 4.0–10.5)

## 2015-07-09 LAB — POCT URINALYSIS DIP (DEVICE)
Bilirubin Urine: NEGATIVE
Glucose, UA: NEGATIVE mg/dL
KETONES UR: NEGATIVE mg/dL
Leukocytes, UA: NEGATIVE
NITRITE: NEGATIVE
PH: 6 (ref 5.0–8.0)
PROTEIN: NEGATIVE mg/dL
Specific Gravity, Urine: >=1.03 (ref 1.005–1.030)
UROBILINOGEN UA: 0.2 mg/dL (ref 0.0–1.0)

## 2015-07-09 NOTE — Progress Notes (Signed)
Declines tdap today 

## 2015-07-09 NOTE — Progress Notes (Signed)
Subjective:  Miranda Bowen is a 29 y.o. G3P2002 at 8059w0d being seen today for ongoing prenatal care.  She is currently monitored for the following issues for this low-risk pregnancy and has H/O cesarean section; Supervision of normal intrauterine pregnancy in multigravida in second trimester; and Beta hemolytic Streptococcus UTI complicating pregnancy, antepartum on her problem list.   2 previous C/S, interested in TOLAC if labors spontaneously. Wanst C/S scheduled at 40 weeks.  Patient reports gradual increase in low abd pressure.  Contractions: Not present. Vag. Bleeding: None.  Movement: Present. Denies leaking of fluid.   The following portions of the patient's history were reviewed and updated as appropriate: allergies, current medications, past family history, past medical history, past social history, past surgical history and problem list. Problem list updated.  Objective:   Filed Vitals:   07/09/15 0840  BP: 120/74  Pulse: 90  Temp: 98.2 F (36.8 C)  Weight: 168 lb 8 oz (76.431 kg)    Fetal Status: Fetal Heart Rate (bpm): 153 Fundal Height: 28 cm Movement: Present     General:  Alert, oriented and cooperative. Patient is in no acute distress.  Skin: Skin is warm and dry. No rash noted.   Cardiovascular: Normal heart rate noted  Respiratory: Normal respiratory effort, no problems with respiration noted  Abdomen: Soft, gravid, appropriate for gestational age. Pain/Pressure: Present     Pelvic: Vag. Bleeding: None     cervical exam refused.        Extremities: Normal range of motion.  Edema: Trace  Mental Status: Normal mood and affect. Normal behavior. Normal judgment and thought content.   Urinalysis: Urine Protein: Negative Urine Glucose: Negative  Assessment and Plan:  Pregnancy: G3P2002 at 10159w0d  There are no diagnoses linked to this encounter. Preterm labor symptoms and general obstetric precautions including but not limited to vaginal bleeding, contractions,  leaking of fluid and fetal movement were reviewed in detail with the patient. Please refer to After Visit Summary for other counseling recommendations.  Return in about 2 weeks (around 07/23/2015).  Declined TDaP for now. Info given.  In-basket message sent to Dr. Shawnie PonsPratt RE; TOLAC.  Dorathy KinsmanVirginia Adrie Picking, CNM

## 2015-07-09 NOTE — Patient Instructions (Signed)
Tdap Vaccine (Tetanus, Diphtheria and Pertussis): What You Need to Know 1. Why get vaccinated? Tetanus, diphtheria and pertussis are very serious diseases. Tdap vaccine can protect us from these diseases. And, Tdap vaccine given to pregnant women can protect newborn babies against pertussis. TETANUS (Lockjaw) is rare in the [place] today. It causes painful muscle tightening and stiffness, usually all over the body.  It can lead to tightening of muscles in the head and neck so you can't open your mouth, swallow, or sometimes even breathe. Tetanus kills about 1 out of 10 people who are infected even after receiving the best medical care. DIPHTHERIA is also rare in the [place] today. It can cause a thick coating to form in the back of the throat.  It can lead to breathing problems, heart failure, paralysis, and death. PERTUSSIS (Whooping Cough) causes severe coughing spells, which can cause difficulty breathing, vomiting and disturbed sleep.  It can also lead to weight loss, incontinence, and rib fractures. Up to 2 in 100 adolescents and 5 in 100 adults with pertussis are hospitalized or have complications, which could include pneumonia or death. These diseases are caused by bacteria. Diphtheria and pertussis are spread from person to person through secretions from coughing or sneezing. Tetanus enters the body through cuts, scratches, or wounds. Before vaccines, as many as 200,000 cases of diphtheria, 200,000 cases of pertussis, and hundreds of cases of tetanus, were reported in the [place] each year. Since vaccination began, reports of cases for tetanus and diphtheria have dropped by about 99% and for pertussis by about 80%. 2. Tdap vaccine Tdap vaccine can protect adolescents and adults from tetanus, diphtheria, and pertussis. One dose of Tdap is routinely given at age 11 or 12. People who did not get Tdap at that age should get it as soon as possible. Tdap is especially important  for healthcare professionals and anyone having close contact with a baby younger than 12 months. Pregnant women should get a dose of Tdap during every pregnancy, to protect the newborn from pertussis. Infants are most at risk for severe, life-threatening complications from pertussis. Another vaccine, called Td, protects against tetanus and diphtheria, but not pertussis. A Td booster should be given every 10 years. Tdap may be given as one of these boosters if you have never gotten Tdap before. Tdap may also be given after a severe cut or burn to prevent tetanus infection. Your doctor or the person giving you the vaccine can give you more information. Tdap may safely be given at the same time as other vaccines. 3. Some people should not get this vaccine  A person who has ever had a life-threatening allergic reaction after a previous dose of any diphtheria, tetanus or pertussis containing vaccine, OR has a severe allergy to any part of this vaccine, should not get Tdap vaccine. Tell the person giving the vaccine about any severe allergies.  Anyone who had coma or long repeated seizures within 7 days after a childhood dose of DTP or DTaP, or a previous dose of Tdap, should not get Tdap, unless a cause other than the vaccine was found. They can still get Td.  Talk to your doctor if you:  have seizures or another nervous system problem,  had severe pain or swelling after any vaccine containing diphtheria, tetanus or pertussis,  ever had a condition called Guillain-Barr Syndrome (GBS),  aren't feeling well on the day the shot is scheduled. 4. Risks With any medicine, including vaccines, there is   a chance of side effects. These are usually mild and go away on their own. Serious reactions are also possible but are rare. Most people who get Tdap vaccine do not have any problems with it. Mild problems following Tdap (Did not interfere with activities)  Pain where the shot was given (about 3 in 4  adolescents or 2 in 3 adults)  Redness or swelling where the shot was given (about 1 person in 5)  Mild fever of at least 100.4F (up to about 1 in 25 adolescents or 1 in 100 adults)  Headache (about 3 or 4 people in 10)  Tiredness (about 1 person in 3 or 4)  Nausea, vomiting, diarrhea, stomach ache (up to 1 in 4 adolescents or 1 in 10 adults)  Chills, sore joints (about 1 person in 10)  Body aches (about 1 person in 3 or 4)  Rash, swollen glands (uncommon) Moderate problems following Tdap (Interfered with activities, but did not require medical attention)  Pain where the shot was given (up to 1 in 5 or 6)  Redness or swelling where the shot was given (up to about 1 in 16 adolescents or 1 in 12 adults)  Fever over 102F (about 1 in 100 adolescents or 1 in 250 adults)  Headache (about 1 in 7 adolescents or 1 in 10 adults)  Nausea, vomiting, diarrhea, stomach ache (up to 1 or 3 people in 100)  Swelling of the entire arm where the shot was given (up to about 1 in 500). Severe problems following Tdap (Unable to perform usual activities; required medical attention)  Swelling, severe pain, bleeding and redness in the arm where the shot was given (rare). Problems that could happen after any vaccine:  People sometimes faint after a medical procedure, including vaccination. Sitting or lying down for about 15 minutes can help prevent fainting, and injuries caused by a fall. Tell your doctor if you feel dizzy, or have vision changes or ringing in the ears.  Some people get severe pain in the shoulder and have difficulty moving the arm where a shot was given. This happens very rarely.  Any medication can cause a severe allergic reaction. Such reactions from a vaccine are very rare, estimated at fewer than 1 in a million doses, and would happen within a few minutes to a few hours after the vaccination. As with any medicine, there is a very remote chance of a vaccine causing a serious  injury or death. The safety of vaccines is always being monitored. For more information, visit: www.cdc.gov/vaccinesafety/ 5. What if there is a serious problem? What should I look for?  Look for anything that concerns you, such as signs of a severe allergic reaction, very high fever, or unusual behavior.  Signs of a severe allergic reaction can include hives, swelling of the face and throat, difficulty breathing, a fast heartbeat, dizziness, and weakness. These would usually start a few minutes to a few hours after the vaccination. What should I do?  If you think it is a severe allergic reaction or other emergency that can't wait, call 9-1-1 or get the person to the nearest hospital. Otherwise, call your doctor.  Afterward, the reaction should be reported to the Vaccine Adverse Event Reporting System (VAERS). Your doctor might file this report, or you can do it yourself through the VAERS web site at www.vaers.hhs.gov, or by calling 1-800-822-7967. VAERS does not give medical advice.  6. The National Vaccine Injury Compensation Program The National Vaccine Injury Compensation Program (  VICP) is a federal program that was created to compensate people who may have been injured by certain vaccines. Persons who believe they may have been injured by a vaccine can learn about the program and about filing a claim by calling 1-800-338-2382 or visiting the VICP website at www.hrsa.gov/vaccinecompensation. There is a time limit to file a claim for compensation. 7. How can I learn more?  Ask your doctor. He or she can give you the vaccine package insert or suggest other sources of information.  Call your local or state health department.  Contact the Centers for Disease Control and Prevention (CDC):  Call 1-800-232-4636 (1-800-CDC-INFO) or  Visit CDC's website at www.cdc.gov/vaccines CDC Tdap Vaccine VIS (09/04/13)   This information is not intended to replace advice given to you by your health care  provider. Make sure you discuss any questions you have with your health care provider.   Document Released: 12/28/2011 Document Revised: 07/19/2014 Document Reviewed: 10/10/2013 Elsevier Interactive Patient Education 2016 Elsevier Inc.  

## 2015-07-10 LAB — RPR

## 2015-07-10 LAB — GLUCOSE TOLERANCE, 1 HOUR (50G) W/O FASTING: GLUCOSE 1 HOUR GTT: 148 mg/dL — AB (ref 70–140)

## 2015-07-14 ENCOUNTER — Encounter: Payer: Self-pay | Admitting: *Deleted

## 2015-07-17 ENCOUNTER — Encounter (HOSPITAL_COMMUNITY): Payer: Self-pay

## 2015-07-17 ENCOUNTER — Inpatient Hospital Stay (HOSPITAL_COMMUNITY)
Admission: AD | Admit: 2015-07-17 | Discharge: 2015-07-17 | Disposition: A | Discharge status: Home / Self Care | Payer: Medicaid Other | Source: Ambulatory Visit | Attending: Family Medicine | Admitting: Family Medicine

## 2015-07-17 DIAGNOSIS — O4703 False labor before 37 completed weeks of gestation, third trimester: Secondary | ICD-10-CM | POA: Diagnosis not present

## 2015-07-17 DIAGNOSIS — Z3A29 29 weeks gestation of pregnancy: Secondary | ICD-10-CM | POA: Insufficient documentation

## 2015-07-17 DIAGNOSIS — N859 Noninflammatory disorder of uterus, unspecified: Secondary | ICD-10-CM

## 2015-07-17 DIAGNOSIS — N858 Other specified noninflammatory disorders of uterus: Secondary | ICD-10-CM

## 2015-07-17 LAB — URINALYSIS, ROUTINE W REFLEX MICROSCOPIC
Bilirubin Urine: NEGATIVE
Glucose, UA: NEGATIVE mg/dL
Hgb urine dipstick: NEGATIVE
KETONES UR: NEGATIVE mg/dL
LEUKOCYTES UA: NEGATIVE
NITRITE: NEGATIVE
PROTEIN: NEGATIVE mg/dL
Specific Gravity, Urine: >1.03 — ABNORMAL HIGH (ref 1.005–1.030)
pH: 6.5 (ref 5.0–8.0)

## 2015-07-17 LAB — FETAL FIBRONECTIN: Fetal Fibronectin: NEGATIVE

## 2015-07-17 NOTE — Discharge Instructions (Signed)

## 2015-07-17 NOTE — MAU Note (Signed)
Pt reports she is having increased pelvic pressure. C/o ctx 1-3 /hr.Pt  Denies SROM or bleeding and good fetal movement felt.

## 2015-07-17 NOTE — MAU Provider Note (Signed)
Chief Complaint:  Pelvic Pain and Contractions   First Provider Initiated Contact with Patient 07/17/15 1941     HPI   Miranda Bowen is a 30 y.o. G3P2002 at 32w1dwho presents to maternity admissions reporting Contractions every 10-15 minutes.  States they are not strong. Denies leaking or bleeding. She reports good fetal movement, denies vaginal itching/burning, urinary symptoms, h/a, dizziness, n/v, diarrhea, constipation or fever/chills.  She denies headache, visual changes..  RN Note: Pt reports she is having increased pelvic pressure. C/o ctx 1-3 /hr.Pt Denies SROM or bleeding and good fetal movement felt.       Past Medical History: Past Medical History  Diagnosis Date  . No pertinent past medical history   . Medical history non-contributory     Past obstetric history: OB History  Gravida Para Term Preterm AB SAB TAB Ectopic Multiple Living  3 2 2  0 0 0 0 0  2    # Outcome Date GA Lbr Len/2nd Weight Sex Delivery Anes PTL Lv  3 Current           2 Term 03/26/14 3210w1d  3.345 kg (7 lb 6 oz) M CS-LTranv EPI  Y  1 Term 2006 328w3d  3.515 kg (7 lb 12 oz) M CS-Unspec Spinal  Y     Comments: c/s " because he flipped over", no other complications except gained too much weight[facility]      Past Surgical History: Past Surgical History  Procedure Laterality Date  . Cesarean section N/A 03/26/2014    Procedure: CESAREAN SECTION;  Surgeon: Allie BossierMyra C Dove, MD;  Location: [facility];  Service: Obstetrics;  Laterality: N/A;  . Cesarean section      Family History: Family History  Problem Relation Age of Onset  . Hypertension Mother     Social History: Social History  Substance Use Topics  . Smoking status: Never Smoker   . Smokeless tobacco: Never Used  . Alcohol Use: No    Allergies: No Known Allergies  Meds:  Prescriptions prior to admission  Medication Sig Dispense Refill Last Dose  . Prenatal Multivit-Min-Fe-FA (PRENATAL VITAMINS) 0.8 MG tablet Take 1 tablet by  mouth daily. 30 tablet 12 07/16/2015 at Unknown time    ROS:  Review of Systems  Constitutional: Negative for fever, chills and fatigue.  Gastrointestinal: Negative for nausea, vomiting, diarrhea and constipation.  Genitourinary: Positive for pelvic pain (mild). Negative for dysuria, flank pain, vaginal bleeding and vaginal discharge.  Musculoskeletal: Negative for back pain.  Other systems negative  I have reviewed patient's Past Medical Hx, Surgical Hx, Family Hx, Social Hx, medications and allergies.   Physical Exam  Patient Vitals for the past 24 hrs:  BP Temp Pulse Resp Height Weight  07/17/15 1848 115/70 mmHg 98.5 F (36.9 C) 80 18 5\' 6"  (1.676 m) 80.015 kg (176 lb 6.4 oz)   Constitutional: Well-developed, well-nourished female in no acute distress.  Cardiovascular: normal rate and rhythm Respiratory: normal effort, clear to auscultate bilaterally GI: Abd soft, non-tender, gravid appropriate for gestational age.   No rebound or guarding. MS: Extremities nontender, no edema, normal ROM Neurologic: Alert and oriented x 4.  GU: Neg CVAT.  PELVIC EXAM: Cervix firm, posterior, neg CMT, uterus nontender, Fundal Height consistent with dates, adnexa without tenderness, enlargement, or mass  Dilation: Closed Presentation: Vertex Exam by:: Wynelle BourgeoisMarie Jimi Giza CNM   FHT:  Baseline 135 , moderate variability, accelerations present, no decelerations Contractions:  Irregular     Labs: Results for orders  placed or performed during the hospital encounter of 07/17/15 (from the past 72 hour(s))  Urinalysis, Routine w reflex microscopic (not at [facility])     Status: Abnormal   Collection Time: 07/17/15  6:50 PM  Result Value Ref Range   Color, Urine YELLOW YELLOW   APPearance CLEAR CLEAR   Specific Gravity, Urine >1.030 (H) 1.005 - 1.030   pH 6.5 5.0 - 8.0   Glucose, UA NEGATIVE NEGATIVE mg/dL   Hgb urine dipstick NEGATIVE NEGATIVE   Bilirubin Urine NEGATIVE NEGATIVE   Ketones, ur NEGATIVE  NEGATIVE mg/dL   Protein, ur NEGATIVE NEGATIVE mg/dL   Nitrite NEGATIVE NEGATIVE   Leukocytes, UA NEGATIVE NEGATIVE    Comment: MICROSCOPIC NOT DONE ON URINES WITH NEGATIVE PROTEIN, BLOOD, LEUKOCYTES, NITRITE, OR GLUCOSE <1000 mg/dL.  Fetal fibronectin     Status: None   Collection Time: 07/17/15  7:29 PM  Result Value Ref Range   Fetal Fibronectin NEGATIVE NEGATIVE    Imaging:  No results found.  MAU Course/MDM: I have ordered labs and reviewed results.  NST reviewed   Irregular contractions, mild   Pt stable at time of discharge. Assessment: SIUP at 104w2d  Preterm contractions, not in labor Negative Fetal fibronectin and no cervical change  is very reassuring  Plan: Discharge home Preterm Labor precautions and fetal kick counts Follow up in Office for prenatal visits and recheck    Medication List    ASK your doctor about these medications        Prenatal Vitamins 0.8 MG tablet  Take 1 tablet by mouth daily.        Wynelle Bourgeois CNM, MSN Certified Nurse-Midwife 07/17/2015 7:41 PM

## 2015-07-23 ENCOUNTER — Ambulatory Visit (INDEPENDENT_AMBULATORY_CARE_PROVIDER_SITE_OTHER): Payer: Medicaid Other | Admitting: Certified Nurse Midwife

## 2015-07-23 ENCOUNTER — Encounter: Payer: Self-pay | Admitting: Certified Nurse Midwife

## 2015-07-23 VITALS — BP 116/87 | HR 91 | Temp 97.7°F | Wt 172.0 lb

## 2015-07-23 DIAGNOSIS — Z3483 Encounter for supervision of other normal pregnancy, third trimester: Secondary | ICD-10-CM

## 2015-07-23 NOTE — Progress Notes (Signed)
Fetal fibronectin negative on 07-17-15 Subjective:  Miranda Bowen is a 30 y.o. G3P2002 at 7079w0d being seen today for ongoing prenatal care.  She is currently monitored for the following issues for this low-risk pregnancy and has H/O cesarean section; Supervision of normal intrauterine pregnancy in multigravida in second trimester; and Beta hemolytic Streptococcus UTI complicating pregnancy, antepartum on her problem list. Pt is here to do 3 hour GTT today  Patient reports vaginal pressure and uterine irritability.  Contractions: Irritability. Vag. Bleeding: None.  Movement: Present. Denies leaking of fluid.   The following portions of the patient's history were reviewed and updated as appropriate: allergies, current medications, past family history, past medical history, past social history, past surgical history and problem list. Problem list updated.  Objective:   Filed Vitals:   07/23/15 0850  BP: 116/87  Pulse: 91  Temp: 97.7 F (36.5 C)  Weight: 172 lb (78.019 kg)    Fetal Status: Fetal Heart Rate (bpm): 135 Fundal Height: 28 cm Movement: Present  Presentation: Undeterminable  General:  Alert, oriented and cooperative. Patient is in no acute distress.  Skin: Skin is warm and dry. No rash noted.   Cardiovascular: Normal heart rate noted  Respiratory: Normal respiratory effort, no problems with respiration noted  Abdomen: Soft, gravid, appropriate for gestational age. Pain/Pressure: Present     Pelvic: Vag. Bleeding: None     Cervical exam performed Dilation: Closed Effacement (%): Thick Station: -3  Extremities: Normal range of motion.  Edema: Trace  Mental Status: Normal mood and affect. Normal behavior. Normal judgment and thought content.   Urinalysis:      Assessment and Plan:  Pregnancy: G3P2002 at 6179w0d  There are no diagnoses linked to this encounter. Preterm labor symptoms and general obstetric precautions including but not limited to vaginal bleeding, contractions,  leaking of fluid and fetal movement were reviewed in detail with the patient. Please refer to After Visit Summary for other counseling recommendations.  Return in about 2 weeks (around 08/06/2015).   Rhea PinkLori A Clemmons, CNM

## 2015-07-23 NOTE — Addendum Note (Signed)
Addended by: Cheree DittoGRAHAM, Ferrel Simington A on: 07/23/2015 11:46 AM   Modules accepted: Orders

## 2015-07-23 NOTE — Progress Notes (Signed)
Patient reports continued irregular contractions this morning similar to her recent MAU visits; states currently 5-8 minutes apart  Here today for 3 hr gtt

## 2015-07-23 NOTE — Patient Instructions (Signed)

## 2015-07-24 LAB — GLUCOSE TOLERANCE, 3 HOURS
Glucose Tolerance, 1 hour: 131 mg/dL (ref 70–189)
Glucose Tolerance, 2 hour: 109 mg/dL (ref 70–164)
Glucose Tolerance, Fasting: 81 mg/dL (ref 65–99)
Glucose, GTT - 3 Hour: 92 mg/dL (ref 70–144)

## 2015-08-06 ENCOUNTER — Telehealth: Payer: Self-pay | Admitting: General Practice

## 2015-08-06 ENCOUNTER — Ambulatory Visit (INDEPENDENT_AMBULATORY_CARE_PROVIDER_SITE_OTHER): Payer: Medicaid Other | Admitting: Student

## 2015-08-06 VITALS — BP 124/89 | HR 98 | Temp 97.5°F | Wt 173.1 lb

## 2015-08-06 DIAGNOSIS — Z3482 Encounter for supervision of other normal pregnancy, second trimester: Secondary | ICD-10-CM

## 2015-08-06 LAB — POCT URINALYSIS DIP (DEVICE)
BILIRUBIN URINE: NEGATIVE
GLUCOSE, UA: NEGATIVE mg/dL
HGB URINE DIPSTICK: NEGATIVE
Ketones, ur: NEGATIVE mg/dL
LEUKOCYTES UA: NEGATIVE
Nitrite: NEGATIVE
Protein, ur: NEGATIVE mg/dL
SPECIFIC GRAVITY, URINE: 1.015 (ref 1.005–1.030)
Urobilinogen, UA: 0.2 mg/dL (ref 0.0–1.0)
pH: 7 (ref 5.0–8.0)

## 2015-08-06 NOTE — Progress Notes (Signed)
C/o contractions most days, but not everyday. Up to 2-3day. C/o headaches somedays- takes tylenol with relief.

## 2015-08-06 NOTE — Telephone Encounter (Signed)
Patient called and left message stating she has questions about her due date. Patient thought she was due earlier in March. Patient requests call back for clarification. Called patient and informed her of due date on 3/22 based off of October ultrasound. Patient verbalized understanding & had no other questions

## 2015-08-06 NOTE — Progress Notes (Signed)
Subjective:  Miranda Bowen is a 30 y.o. G3P2002 at [redacted]w[redacted]d being seen today for ongoing prenatal care.  She is currently monitored for the following issues for this low-risk pregnancy and has H/O cesarean section; Supervision of normal intrauterine pregnancy in multigravida in second trimester; and Beta hemolytic Streptococcus UTI complicating pregnancy, antepartum on her problem list.  Patient reports occasional contractions; 1-3 mild contractions per day.  Contractions: Irregular. Vag. Bleeding: None.  Movement: Present. Denies leaking of fluid.   The following portions of the patient's history were reviewed and updated as appropriate: allergies, current medications, past family history, past medical history, past social history, past surgical history and problem list. Problem list updated.  Objective:   Filed Vitals:   08/06/15 0932  BP: 124/89  Pulse: 98  Temp: 97.5 F (36.4 C)  Weight: 173 lb 1.6 oz (78.518 kg)    Fetal Status: Fetal Heart Rate (bpm): 134 Fundal Height: 30 cm Movement: Present     General:  Alert, oriented and cooperative. Patient is in no acute distress.  Skin: Skin is warm and dry. No rash noted.   Cardiovascular: Normal heart rate noted  Respiratory: Normal respiratory effort, no problems with respiration noted  Abdomen: Soft, gravid, appropriate for gestational age. Pain/Pressure: Present     Pelvic: Vag. Bleeding: None     Cervical exam deferred        Extremities: Normal range of motion.  Edema: Trace  Mental Status: Normal mood and affect. Normal behavior. Normal judgment and thought content.   Urinalysis:      Assessment and Plan:  Pregnancy: G3P2002 at [redacted]w[redacted]d  1. Supervision of normal intrauterine pregnancy in multigravida in second trimester   Preterm labor symptoms and general obstetric precautions including but not limited to vaginal bleeding, contractions, leaking of fluid and fetal movement were reviewed in detail with the patient. Please  refer to After Visit Summary for other counseling recommendations.  Return in about 2 weeks (around 08/20/2015).   Judeth Horn, NP

## 2015-08-06 NOTE — Patient Instructions (Signed)
Third Trimester of Pregnancy The third trimester is from week 29 through week 42, months 7 through 9. The third trimester is a time when the fetus is growing rapidly. At the end of the ninth month, the fetus is about 20 inches in length and weighs 6-10 pounds.  BODY CHANGES Your body goes through many changes during pregnancy. The changes vary from woman to woman.   Your weight will continue to increase. You can expect to gain 25-35 pounds (11-16 kg) by the end of the pregnancy.  You may begin to get stretch marks on your hips, abdomen, and breasts.  You may urinate more often because the fetus is moving lower into your pelvis and pressing on your bladder.  You may develop or continue to have heartburn as a result of your pregnancy.  You may develop constipation because certain hormones are causing the muscles that push waste through your intestines to slow down.  You may develop hemorrhoids or swollen, bulging veins (varicose veins).  You may have pelvic pain because of the weight gain and pregnancy hormones relaxing your joints between the bones in your pelvis. Backaches may result from overexertion of the muscles supporting your posture.  You may have changes in your hair. These can include thickening of your hair, rapid growth, and changes in texture. Some women also have hair loss during or after pregnancy, or hair that feels dry or thin. Your hair will most likely return to normal after your baby is born.  Your breasts will continue to grow and be tender. A yellow discharge may leak from your breasts called colostrum.  Your belly button may stick out.  You may feel short of breath because of your expanding uterus.  You may notice the fetus "dropping," or moving lower in your abdomen.  You may have a bloody mucus discharge. This usually occurs a few days to a week before labor begins.  Your cervix becomes thin and soft (effaced) near your due date. WHAT TO EXPECT AT YOUR PRENATAL  EXAMS  You will have prenatal exams every 2 weeks until week 36. Then, you will have weekly prenatal exams. During a routine prenatal visit:  You will be weighed to make sure you and the fetus are growing normally.  Your blood pressure is taken.  Your abdomen will be measured to track your baby's growth.  The fetal heartbeat will be listened to.  Any test results from the previous visit will be discussed.  You may have a cervical check near your due date to see if you have effaced. At around 36 weeks, your caregiver will check your cervix. At the same time, your caregiver will also perform a test on the secretions of the vaginal tissue. This test is to determine if a type of bacteria, Group B streptococcus, is present. Your caregiver will explain this further. Your caregiver may ask you:  What your birth plan is.  How you are feeling.  If you are feeling the baby move.  If you have had any abnormal symptoms, such as leaking fluid, bleeding, severe headaches, or abdominal cramping.  If you are using any tobacco products, including cigarettes, chewing tobacco, and electronic cigarettes.  If you have any questions. Other tests or screenings that may be performed during your third trimester include:  Blood tests that check for low iron levels (anemia).  Fetal testing to check the health, activity level, and growth of the fetus. Testing is done if you have certain medical conditions or if   there are problems during the pregnancy.  HIV (human immunodeficiency virus) testing. If you are at high risk, you may be screened for HIV during your third trimester of pregnancy. FALSE LABOR You may feel small, irregular contractions that eventually go away. These are called Braxton Hicks contractions, or false labor. Contractions may last for hours, days, or even weeks before true labor sets in. If contractions come at regular intervals, intensify, or become painful, it is best to be seen by your  caregiver.  SIGNS OF LABOR   Menstrual-like cramps.  Contractions that are 5 minutes apart or less.  Contractions that start on the top of the uterus and spread down to the lower abdomen and back.  A sense of increased pelvic pressure or back pain.  A watery or bloody mucus discharge that comes from the vagina. If you have any of these signs before the 37th week of pregnancy, call your caregiver right away. You need to go to the hospital to get checked immediately. HOME CARE INSTRUCTIONS   Avoid all smoking, herbs, alcohol, and unprescribed drugs. These chemicals affect the formation and growth of the baby.  Do not use any tobacco products, including cigarettes, chewing tobacco, and electronic cigarettes. If you need help quitting, ask your health care provider. You may receive counseling support and other resources to help you quit.  Follow your caregiver's instructions regarding medicine use. There are medicines that are either safe or unsafe to take during pregnancy.  Exercise only as directed by your caregiver. Experiencing uterine cramps is a good sign to stop exercising.  Continue to eat regular, healthy meals.  Wear a good support bra for breast tenderness.  Do not use hot tubs, steam rooms, or saunas.  Wear your seat belt at all times when driving.  Avoid raw meat, uncooked cheese, cat litter boxes, and soil used by cats. These carry germs that can cause birth defects in the baby.  Take your prenatal vitamins.  Take 1500-2000 mg of calcium daily starting at the 20th week of pregnancy until you deliver your baby.  Try taking a stool softener (if your caregiver approves) if you develop constipation. Eat more high-fiber foods, such as fresh vegetables or fruit and whole grains. Drink plenty of fluids to keep your urine clear or pale yellow.  Take warm sitz baths to soothe any pain or discomfort caused by hemorrhoids. Use hemorrhoid cream if your caregiver approves.  If  you develop varicose veins, wear support hose. Elevate your feet for 15 minutes, 3-4 times a day. Limit salt in your diet.  Avoid heavy lifting, wear low heal shoes, and practice good posture.  Rest a lot with your legs elevated if you have leg cramps or low back pain.  Visit your dentist if you have not gone during your pregnancy. Use a soft toothbrush to brush your teeth and be gentle when you floss.  A sexual relationship may be continued unless your caregiver directs you otherwise.  Do not travel far distances unless it is absolutely necessary and only with the approval of your caregiver.  Take prenatal classes to understand, practice, and ask questions about the labor and delivery.  Make a trial run to the hospital.  Pack your hospital bag.  Prepare the baby's nursery.  Continue to go to all your prenatal visits as directed by your caregiver. SEEK MEDICAL CARE IF:  You are unsure if you are in labor or if your water has broken.  You have dizziness.  You have  mild pelvic cramps, pelvic pressure, or nagging pain in your abdominal area.  You have persistent nausea, vomiting, or diarrhea.  You have a bad smelling vaginal discharge.  You have pain with urination. SEEK IMMEDIATE MEDICAL CARE IF:   You have a fever.  You are leaking fluid from your vagina.  You have spotting or bleeding from your vagina.  You have severe abdominal cramping or pain.  You have rapid weight loss or gain.  You have shortness of breath with chest pain.  You notice sudden or extreme swelling of your face, hands, ankles, feet, or legs.  You have not felt your baby move in over an hour.  You have severe headaches that do not go away with medicine.  You have vision changes.   This information is not intended to replace advice given to you by your health care provider. Make sure you discuss any questions you have with your health care provider.   Document Released: 06/22/2001 Document  Revised: 07/19/2014 Document Reviewed: 08/29/2012 Elsevier Interactive Patient Education 2016 [employer]. Preterm Labor Information Preterm labor is when labor starts at less than 37 weeks of pregnancy. The normal length of a pregnancy is 39 to 41 weeks. CAUSES Often, there is no identifiable underlying cause as to why a woman goes into preterm labor. One of the most common known causes of preterm labor is infection. Infections of the uterus, cervix, vagina, amniotic sac, bladder, kidney, or even the lungs (pneumonia) can cause labor to start. Other suspected causes of preterm labor include:   Urogenital infections, such as yeast infections and bacterial vaginosis.   Uterine abnormalities (uterine shape, uterine septum, fibroids, or bleeding from the placenta).   A cervix that has been operated on (it may fail to stay closed).   Malformations in the fetus.   Multiple gestations (twins, triplets, and so on).   Breakage of the amniotic sac.  RISK FACTORS  Having a previous history of preterm labor.   Having premature rupture of membranes (PROM).   Having a placenta that covers the opening of the cervix (placenta previa).   Having a placenta that separates from the uterus (placental abruption).   Having a cervix that is too weak to hold the fetus in the uterus (incompetent cervix).   Having too much fluid in the amniotic sac (polyhydramnios).   Taking illegal drugs or smoking while pregnant.   Not gaining enough weight while pregnant.   Being younger than 63 and older than 32 years old.   Having a low socioeconomic status.   Being African American. SYMPTOMS Signs and symptoms of preterm labor include:   Menstrual-like cramps, abdominal pain, or back pain.  Uterine contractions that are regular, as frequent as six in an hour, regardless of their intensity (may be mild or painful).  Contractions that start on the top of the uterus and spread down to the  lower abdomen and back.   A sense of increased pelvic pressure.   A watery or bloody mucus discharge that comes from the vagina.  TREATMENT Depending on the length of the pregnancy and other circumstances, your health care provider may suggest bed rest. If necessary, there are medicines that can be given to stop contractions and to mature the fetal lungs. If labor happens before 34 weeks of pregnancy, a prolonged hospital stay may be recommended. Treatment depends on the condition of both you and the fetus.  WHAT SHOULD YOU DO IF YOU THINK YOU ARE IN PRETERM LABOR? Call your  health care provider right away. You will need to go to the hospital to get checked immediately. HOW CAN YOU PREVENT PRETERM LABOR IN FUTURE PREGNANCIES? You should:   Stop smoking if you smoke.  Maintain healthy weight gain and avoid chemicals and drugs that are not necessary.  Be watchful for any type of infection.  Inform your health care provider if you have a known history of preterm labor.   This information is not intended to replace advice given to you by your health care provider. Make sure you discuss any questions you have with your health care provider.   Document Released: 09/18/2003 Document Revised: 02/28/2013 Document Reviewed: 07/31/2012 Elsevier Interactive Patient Education 2016 [employer] Fetal Movement Counts Patient Name: __________________________________________________ Patient Due Date: ____________________ Performing a fetal movement count is highly recommended in high-risk pregnancies, but it is good for every pregnant woman to do. Your health care provider may ask you to start counting fetal movements at 28 weeks of the pregnancy. Fetal movements often increase:  After eating a full meal.  After physical activity.  After eating or drinking something sweet or cold.  At rest. Pay attention to when you feel the baby is most active. This will help you notice a pattern of your  baby's sleep and wake cycles and what factors contribute to an increase in fetal movement. It is important to perform a fetal movement count at the same time each day when your baby is normally most active.  HOW TO COUNT FETAL MOVEMENTS  Find a quiet and comfortable area to sit or lie down on your left side. Lying on your left side provides the best blood and oxygen circulation to your baby.  Write down the day and time on a sheet of paper or in a journal.  Start counting kicks, flutters, swishes, rolls, or jabs in a 2-hour period. You should feel at least 10 movements within 2 hours.  If you do not feel 10 movements in 2 hours, wait 2-3 hours and count again. Look for a change in the pattern or not enough counts in 2 hours. SEEK MEDICAL CARE IF:  You feel less than 10 counts in 2 hours, tried twice.  There is no movement in over an hour.  The pattern is changing or taking longer each day to reach 10 counts in 2 hours.  You feel the baby is not moving as he or she usually does. Date: ____________ Movements: ____________ Start time: ____________ Doreatha Martin time: ____________  Date: ____________ Movements: ____________ Start time: ____________ Doreatha Martin time: ____________ Date: ____________ Movements: ____________ Start time: ____________ Doreatha Martin time: ____________ Date: ____________ Movements: ____________ Start time: ____________ Doreatha Martin time: ____________ Date: ____________ Movements: ____________ Start time: ____________ Doreatha Martin time: ____________ Date: ____________ Movements: ____________ Start time: ____________ Doreatha Martin time: ____________ Date: ____________ Movements: ____________ Start time: ____________ Doreatha Martin time: ____________ Date: ____________ Movements: ____________ Start time: ____________ Doreatha Martin time: ____________  Date: ____________ Movements: ____________ Start time: ____________ Doreatha Martin time: ____________ Date: ____________ Movements: ____________ Start time: ____________ Doreatha Martin time:  ____________ Date: ____________ Movements: ____________ Start time: ____________ Doreatha Martin time: ____________ Date: ____________ Movements: ____________ Start time: ____________ Doreatha Martin time: ____________ Date: ____________ Movements: ____________ Start time: ____________ Doreatha Martin time: ____________ Date: ____________ Movements: ____________ Start time: ____________ Doreatha Martin time: ____________ Date: ____________ Movements: ____________ Start time: ____________ Doreatha Martin time: ____________  Date: ____________ Movements: ____________ Start time: ____________ Doreatha Martin time: ____________ Date: ____________ Movements: ____________ Start time: ____________ Doreatha Martin time: ____________ Date: ____________ Movements: ____________ Start time: ____________ Doreatha Martin time:  ____________ Date: ____________ Movements: ____________ Start time: ____________ Doreatha Martin time: ____________ Date: ____________ Movements: ____________ Start time: ____________ Doreatha Martin time: ____________ Date: ____________ Movements: ____________ Start time: ____________ Doreatha Martin time: ____________ Date: ____________ Movements: ____________ Start time: ____________ Doreatha Martin time: ____________  Date: ____________ Movements: ____________ Start time: ____________ Doreatha Martin time: ____________ Date: ____________ Movements: ____________ Start time: ____________ Doreatha Martin time: ____________ Date: ____________ Movements: ____________ Start time: ____________ Doreatha Martin time: ____________ Date: ____________ Movements: ____________ Start time: ____________ Doreatha Martin time: ____________ Date: ____________ Movements: ____________ Start time: ____________ Doreatha Martin time: ____________ Date: ____________ Movements: ____________ Start time: ____________ Doreatha Martin time: ____________ Date: ____________ Movements: ____________ Start time: ____________ Doreatha Martin time: ____________  Date: ____________ Movements: ____________ Start time: ____________ Doreatha Martin time: ____________ Date: ____________ Movements:  ____________ Start time: ____________ Doreatha Martin time: ____________ Date: ____________ Movements: ____________ Start time: ____________ Doreatha Martin time: ____________ Date: ____________ Movements: ____________ Start time: ____________ Doreatha Martin time: ____________ Date: ____________ Movements: ____________ Start time: ____________ Doreatha Martin time: ____________ Date: ____________ Movements: ____________ Start time: ____________ Doreatha Martin time: ____________ Date: ____________ Movements: ____________ Start time: ____________ Doreatha Martin time: ____________  Date: ____________ Movements: ____________ Start time: ____________ Doreatha Martin time: ____________ Date: ____________ Movements: ____________ Start time: ____________ Doreatha Martin time: ____________ Date: ____________ Movements: ____________ Start time: ____________ Doreatha Martin time: ____________ Date: ____________ Movements: ____________ Start time: ____________ Doreatha Martin time: ____________ Date: ____________ Movements: ____________ Start time: ____________ Doreatha Martin time: ____________ Date: ____________ Movements: ____________ Start time: ____________ Doreatha Martin time: ____________ Date: ____________ Movements: ____________ Start time: ____________ Doreatha Martin time: ____________  Date: ____________ Movements: ____________ Start time: ____________ Doreatha Martin time: ____________ Date: ____________ Movements: ____________ Start time: ____________ Doreatha Martin time: ____________ Date: ____________ Movements: ____________ Start time: ____________ Doreatha Martin time: ____________ Date: ____________ Movements: ____________ Start time: ____________ Doreatha Martin time: ____________ Date: ____________ Movements: ____________ Start time: ____________ Doreatha Martin time: ____________ Date: ____________ Movements: ____________ Start time: ____________ Doreatha Martin time: ____________ Date: ____________ Movements: ____________ Start time: ____________ Doreatha Martin time: ____________  Date: ____________ Movements: ____________ Start time: ____________ Doreatha Martin  time: ____________ Date: ____________ Movements: ____________ Start time: ____________ Doreatha Martin time: ____________ Date: ____________ Movements: ____________ Start time: ____________ Doreatha Martin time: ____________ Date: ____________ Movements: ____________ Start time: ____________ Doreatha Martin time: ____________ Date: ____________ Movements: ____________ Start time: ____________ Doreatha Martin time: ____________ Date: ____________ Movements: ____________ Start time: ____________ Doreatha Martin time: ____________   This information is not intended to replace advice given to you by your health care provider. Make sure you discuss any questions you have with your health care provider.   Document Released: 07/28/2006 Document Revised: 07/19/2014 Document Reviewed: 04/24/2012 Elsevier Interactive Patient Education [employer]

## 2015-08-20 ENCOUNTER — Ambulatory Visit (INDEPENDENT_AMBULATORY_CARE_PROVIDER_SITE_OTHER): Payer: Medicaid Other | Admitting: Certified Nurse Midwife

## 2015-08-20 VITALS — BP 108/70 | HR 99 | Temp 97.7°F | Wt 178.0 lb

## 2015-08-20 DIAGNOSIS — O26899 Other specified pregnancy related conditions, unspecified trimester: Secondary | ICD-10-CM

## 2015-08-20 DIAGNOSIS — G5600 Carpal tunnel syndrome, unspecified upper limb: Secondary | ICD-10-CM | POA: Insufficient documentation

## 2015-08-20 DIAGNOSIS — Z3482 Encounter for supervision of other normal pregnancy, second trimester: Secondary | ICD-10-CM

## 2015-08-20 DIAGNOSIS — Z98891 History of uterine scar from previous surgery: Secondary | ICD-10-CM | POA: Diagnosis not present

## 2015-08-20 LAB — POCT URINALYSIS DIP (DEVICE)
BILIRUBIN URINE: NEGATIVE
GLUCOSE, UA: NEGATIVE mg/dL
KETONES UR: NEGATIVE mg/dL
LEUKOCYTES UA: NEGATIVE
Nitrite: NEGATIVE
Protein, ur: NEGATIVE mg/dL
SPECIFIC GRAVITY, URINE: 1.025 (ref 1.005–1.030)
Urobilinogen, UA: 1 mg/dL (ref 0.0–1.0)
pH: 6.5 (ref 5.0–8.0)

## 2015-08-20 NOTE — Progress Notes (Signed)
Patient reports swelling, tingling and throbbing sensation in both hands all the time; already trying wrist braces but they provide minimal relief Discussed breastfeeding with patient

## 2015-08-20 NOTE — Patient Instructions (Signed)
Carpal Tunnel Syndrome Carpal tunnel syndrome is a condition that causes pain in your hand and arm. The carpal tunnel is a narrow area that is on the palm side of your wrist. Repeated wrist motion or certain diseases may cause swelling in the tunnel. This swelling can pinch the main nerve in the wrist (median nerve).  HOME CARE If You Have a Splint:  Wear it as told by your doctor. Remove it only as told by your doctor.  Loosen the splint if your fingers:  Become numb and tingle.  Turn blue and cold.  Keep the splint clean and dry. General Instructions  Take over-the-counter and prescription medicines only as told by your doctor.  Rest your wrist from any activity that may be causing your pain. If needed, talk to your employer about changes that can be made in your work, such as getting a wrist pad to use while typing.  If directed, apply ice to the painful area:  Put ice in a plastic bag.  Place a towel between your skin and the bag.  Leave the ice on for 20 minutes, 2-3 times per day.  Keep all follow-up visits as told by your doctor. This is important.  Do any exercises as told by your doctor, physical therapist, or occupational therapist. GET HELP IF:  You have new symptoms.  Medicine does not help your pain.  Your symptoms get worse.   This information is not intended to replace advice given to you by your health care provider. Make sure you discuss any questions you have with your health care provider.   Document Released: 06/17/2011 Document Revised: 03/19/2015 Document Reviewed: 11/13/2014 Elsevier Interactive Patient Education 2016 Else[employer] Inc[employer]  

## 2015-08-20 NOTE — Progress Notes (Signed)
Subjective:  Miranda Bowen is a 30 y.o. G3P2002 at [redacted]w[redacted]d being seen today for ongoing prenatal care.  She is currently monitored for the following issues for this low-risk pregnancy and has H/O cesarean section; Supervision of normal intrauterine pregnancy in multigravida in second trimester; Beta hemolytic Streptococcus UTI complicating pregnancy, antepartum; and Carpal tunnel syndrome during pregnancy on her problem list.  Patient reports carpal tunnel symptoms.  Contractions: Irritability. Vag. Bleeding: None.  Movement: Present. Denies leaking of fluid.   The following portions of the patient's history were reviewed and updated as appropriate: allergies, current medications, past family history, past medical history, past social history, past surgical history and problem list. Problem list updated.  Objective:   Filed Vitals:   08/20/15 0948  BP: 108/70  Pulse: 99  Temp: 97.7 F (36.5 C)  Weight: 178 lb (80.74 kg)    Fetal Status: Fetal Heart Rate (bpm): 137 Fundal Height: 33 cm Movement: Present     General:  Alert, oriented and cooperative. Patient is in no acute distress.  Skin: Skin is warm and dry. No rash noted.   Cardiovascular: Normal heart rate noted  Respiratory: Normal respiratory effort, no problems with respiration noted  Abdomen: Soft, gravid, appropriate for gestational age. Pain/Pressure: Present     Pelvic: Vag. Bleeding: None     Cervical exam deferred        Extremities: Normal range of motion.  Edema: Trace  Mental Status: Normal mood and affect. Normal behavior. Normal judgment and thought content.   Urinalysis: Urine Protein: Negative Urine Glucose: Negative  Assessment and Plan:  Pregnancy: G3P2002 at [redacted]w[redacted]d  1. Supervision of normal intrauterine pregnancy in multigravida in second trimester   2. H/O cesarean section   3. Carpal tunnel syndrome during pregnancy Advised ice, tylenol and wrist splints  Preterm labor symptoms and general obstetric  precautions including but not limited to vaginal bleeding, contractions, leaking of fluid and fetal movement were reviewed in detail with the patient. Please refer to After Visit Summary for other counseling recommendations.  No Follow-up on file.   Rhea Pink, CNM

## 2015-08-27 ENCOUNTER — Telehealth: Payer: Self-pay | Admitting: *Deleted

## 2015-08-27 NOTE — Telephone Encounter (Signed)
Pt reports that her arms and hands are hurting. Her fingers are starting to swell as well. She wanted to know if there is anything she can do besides taking tylenol.

## 2015-08-28 NOTE — Telephone Encounter (Signed)
Left message on patient voice mail we are returning her call if she needs to reach [place] she can call us back in regards to hands hurting.

## 2015-08-29 NOTE — Telephone Encounter (Signed)
Left a detailed message on patient voicemail 

## 2015-09-02 ENCOUNTER — Other Ambulatory Visit (HOSPITAL_COMMUNITY)
Admission: RE | Admit: 2015-09-02 | Discharge: 2015-09-02 | Disposition: A | Discharge status: Home / Self Care | Payer: Medicaid Other | Source: Ambulatory Visit | Attending: Advanced Practice Midwife | Admitting: Advanced Practice Midwife

## 2015-09-02 ENCOUNTER — Other Ambulatory Visit: Payer: Self-pay | Admitting: Obstetrics and Gynecology

## 2015-09-02 ENCOUNTER — Ambulatory Visit (INDEPENDENT_AMBULATORY_CARE_PROVIDER_SITE_OTHER): Payer: Medicaid Other | Admitting: Advanced Practice Midwife

## 2015-09-02 ENCOUNTER — Encounter (HOSPITAL_COMMUNITY): Payer: Self-pay | Admitting: *Deleted

## 2015-09-02 VITALS — BP 112/68 | HR 92 | Wt 181.9 lb

## 2015-09-02 DIAGNOSIS — O26899 Other specified pregnancy related conditions, unspecified trimester: Secondary | ICD-10-CM

## 2015-09-02 DIAGNOSIS — G5600 Carpal tunnel syndrome, unspecified upper limb: Secondary | ICD-10-CM | POA: Diagnosis not present

## 2015-09-02 DIAGNOSIS — M5431 Sciatica, right side: Secondary | ICD-10-CM | POA: Diagnosis not present

## 2015-09-02 DIAGNOSIS — O34219 Maternal care for unspecified type scar from previous cesarean delivery: Secondary | ICD-10-CM

## 2015-09-02 DIAGNOSIS — Z113 Encounter for screening for infections with a predominantly sexual mode of transmission: Secondary | ICD-10-CM

## 2015-09-02 DIAGNOSIS — Z98891 History of uterine scar from previous surgery: Secondary | ICD-10-CM

## 2015-09-02 LAB — POCT URINALYSIS DIP (DEVICE)
Bilirubin Urine: NEGATIVE
GLUCOSE, UA: NEGATIVE mg/dL
HGB URINE DIPSTICK: NEGATIVE
Ketones, ur: NEGATIVE mg/dL
NITRITE: NEGATIVE
PROTEIN: NEGATIVE mg/dL
SPECIFIC GRAVITY, URINE: 1.025 (ref 1.005–1.030)
UROBILINOGEN UA: 0.2 mg/dL (ref 0.0–1.0)
pH: 7 (ref 5.0–8.0)

## 2015-09-02 MED ORDER — CYCLOBENZAPRINE HCL 10 MG PO TABS: 10.0000 mg | ORAL_TABLET | Freq: Three times a day (TID) | ORAL | Status: DC | PRN

## 2015-09-02 NOTE — Progress Notes (Signed)
Subjective:  Miranda Bowen is a 30 y.o. G3P2002 at [redacted]w[redacted]d being seen today for ongoing prenatal care.  She is currently monitored for the following issues for this low-risk pregnancy and has H/O cesarean section; Supervision of normal intrauterine pregnancy in multigravida in second trimester; Beta hemolytic Streptococcus UTI complicating pregnancy, antepartum; and Carpal tunnel syndrome during pregnancy on her problem list.  Patient reports carpal tunnel symptoms and right hip pain radiating into right leg.  Contractions: Irritability. Vag. Bleeding: None.  Movement: Present. Denies leaking of fluid.   The following portions of the patient's history were reviewed and updated as appropriate: allergies, current medications, past family history, past medical history, past social history, past surgical history and problem list. Problem list updated.  Objective:   Filed Vitals:   09/02/15 0845  BP: 112/68  Pulse: 92  Weight: 181 lb 14.4 oz (82.509 kg)    Fetal Status: Fetal Heart Rate (bpm): 136 Fundal Height: 36 cm Movement: Present     General:  Alert, oriented and cooperative. Patient is in no acute distress.  Skin: Skin is warm and dry. No rash noted.   Cardiovascular: Normal heart rate noted  Respiratory: Normal respiratory effort, no problems with respiration noted  Abdomen: Soft, gravid, appropriate for gestational age. Pain/Pressure: Present     Pelvic: Vag. Bleeding: None     Cervical exam performed Dilation: Closed Effacement (%): 50 Station: -3  Extremities: Normal range of motion.  Edema: Trace  Mental Status: Normal mood and affect. Normal behavior. Normal judgment and thought content.   Urinalysis: Urine Protein: Negative Urine Glucose: Negative  Assessment and Plan:  Pregnancy: G3P2002 at [redacted]w[redacted]d  1. Previous cesarean section complicating pregnancy  - GC/Chlamydia probe amp ([place])not at [facility] - Culture, beta strep (group b only) - POCT urinalysis dip  (device) --Pt originally desired C/S at 40 weeks, and TOLAC if labor started earlier.  Pt still may desire TOLAC but wants repeat C/S scheduled at 39 weeks due to current discomforts of pregnancy.  2. Carpal tunnel syndrome during pregnancy --Continue wrist braces.  Ice PRN.    4. Sciatica of right side --Try hands and knees or leaning forward positions to change fetal position. - cyclobenzaprine (FLEXERIL) 10 MG tablet; Take 1 tablet (10 mg total) by mouth 3 (three) times daily as needed for muscle spasms.  Dispense: 30 tablet; Refill: 0  Preterm labor symptoms and general obstetric precautions including but not limited to vaginal bleeding, contractions, leaking of fluid and fetal movement were reviewed in detail with the patient. Please refer to After Visit Summary for other counseling recommendations.  Return in about 1 day (around 09/03/2015).   Hurshel Party, CNM

## 2015-09-03 ENCOUNTER — Encounter: Payer: Self-pay | Admitting: Advanced Practice Midwife

## 2015-09-03 DIAGNOSIS — O99820 Streptococcus B carrier state complicating pregnancy: Secondary | ICD-10-CM | POA: Insufficient documentation

## 2015-09-03 LAB — GC/CHLAMYDIA PROBE AMP (~~LOC~~) NOT AT ARMC
Chlamydia: NEGATIVE
Neisseria Gonorrhea: NEGATIVE

## 2015-09-03 LAB — CULTURE, BETA STREP (GROUP B ONLY)

## 2015-09-10 ENCOUNTER — Ambulatory Visit (INDEPENDENT_AMBULATORY_CARE_PROVIDER_SITE_OTHER): Payer: Medicaid Other | Admitting: Obstetrics and Gynecology

## 2015-09-10 VITALS — BP 115/79 | HR 84 | Temp 98.4°F | Wt 184.3 lb

## 2015-09-10 DIAGNOSIS — Z3482 Encounter for supervision of other normal pregnancy, second trimester: Secondary | ICD-10-CM | POA: Diagnosis not present

## 2015-09-10 DIAGNOSIS — Z98891 History of uterine scar from previous surgery: Secondary | ICD-10-CM

## 2015-09-10 LAB — POCT URINALYSIS DIP (DEVICE)
Bilirubin Urine: NEGATIVE
GLUCOSE, UA: NEGATIVE mg/dL
HGB URINE DIPSTICK: NEGATIVE
Ketones, ur: NEGATIVE mg/dL
Leukocytes, UA: NEGATIVE
NITRITE: NEGATIVE
PROTEIN: NEGATIVE mg/dL
UROBILINOGEN UA: 1 mg/dL (ref 0.0–1.0)
pH: 6.5 (ref 5.0–8.0)

## 2015-09-10 NOTE — Progress Notes (Signed)
Subjective:  Miranda Bowen is a 30 y.o. G3P2002 at [redacted]w[redacted]d being seen today for ongoing prenatal care.  She is currently monitored for the following issues for this low-risk pregnancy and has H/O cesarean section; Supervision of normal intrauterine pregnancy in multigravida in second trimester; Beta hemolytic Streptococcus UTI complicating pregnancy, antepartum; Carpal tunnel syndrome during pregnancy; and GBS (group B Streptococcus carrier), +RV culture, currently pregnant on her problem list.  Patient reports no complaints.  Contractions: Irregular. Vag. Bleeding: None.  Movement: Present. Denies leaking of fluid.   The following portions of the patient's history were reviewed and updated as appropriate: allergies, current medications, past family history, past medical history, past social history, past surgical history and problem list. Problem list updated.  Objective:   Filed Vitals:   09/10/15 1003  BP: 115/79  Pulse: 84  Temp: 98.4 F (36.9 C)  Weight: 184 lb 4.8 oz (83.598 kg)    Fetal Status: Fetal Heart Rate (bpm): 140   Movement: Present     General:  Alert, oriented and cooperative. Patient is in no acute distress.  Skin: Skin is warm and dry. No rash noted.   Cardiovascular: Normal heart rate noted  Respiratory: Normal respiratory effort, no problems with respiration noted  Abdomen: Soft, gravid, appropriate for gestational age. Pain/Pressure: Present     Pelvic: Vag. Bleeding: None     Cervical exam deferred        Extremities: Normal range of motion.  Edema: Trace  Mental Status: Normal mood and affect. Normal behavior. Normal judgment and thought content.   Urinalysis: Urine Protein: Negative Urine Glucose: Negative  Assessment and Plan:  Pregnancy: G3P2002 at [redacted]w[redacted]d  1. Supervision of normal intrauterine pregnancy in multigravida in second trimester - c/s scheduled - declines tdap  Preterm labor symptoms and general obstetric precautions including but not  limited to vaginal bleeding, contractions, leaking of fluid and fetal movement were reviewed in detail with the patient. Please refer to After Visit Summary for other counseling recommendations.  F/u 1 wk   Kathrynn Running, MD

## 2015-09-10 NOTE — Progress Notes (Signed)
Breastfeeding tip of the week reviewed. 

## 2015-09-17 ENCOUNTER — Ambulatory Visit (INDEPENDENT_AMBULATORY_CARE_PROVIDER_SITE_OTHER): Payer: Medicaid Other | Admitting: Advanced Practice Midwife

## 2015-09-17 VITALS — BP 111/75 | HR 101 | Temp 98.6°F | Wt 182.7 lb

## 2015-09-17 DIAGNOSIS — R109 Unspecified abdominal pain: Secondary | ICD-10-CM

## 2015-09-17 DIAGNOSIS — O26899 Other specified pregnancy related conditions, unspecified trimester: Secondary | ICD-10-CM

## 2015-09-17 DIAGNOSIS — O9989 Other specified diseases and conditions complicating pregnancy, childbirth and the puerperium: Secondary | ICD-10-CM | POA: Diagnosis present

## 2015-09-17 DIAGNOSIS — O34219 Maternal care for unspecified type scar from previous cesarean delivery: Secondary | ICD-10-CM | POA: Diagnosis not present

## 2015-09-17 LAB — POCT URINALYSIS DIP (DEVICE)
BILIRUBIN URINE: NEGATIVE
Glucose, UA: NEGATIVE mg/dL
KETONES UR: NEGATIVE mg/dL
Nitrite: NEGATIVE
Protein, ur: 100 mg/dL — AB
Urobilinogen, UA: 0.2 mg/dL (ref 0.0–1.0)
pH: 6 (ref 5.0–8.0)

## 2015-09-17 NOTE — Patient Instructions (Signed)
Labor Precautions Reasons to come to MAU:  1.  Contractions are  5 minutes apart or less, each last 1 minute, and you cannot walk or talk during them 2.  You have a large gush of fluid, or a trickle of fluid that will not stop and you have to wear a pad 3.  You have bleeding that is bright red, heavier than spotting--like menstrual bleeding (spotting can be normal in early labor or after a check of your cervix) 4.  You do not feel the baby moving like he/she normally does

## 2015-09-17 NOTE — Progress Notes (Signed)
Subjective:  Miranda Bowen is a 30 y.o. G3P2002 at 4233w0d being seen today for ongoing prenatal care.  She is currently monitored for the following issues for this low-risk pregnancy and has H/O cesarean section; Supervision of normal intrauterine pregnancy in multigravida in second trimester; Beta hemolytic Streptococcus UTI complicating pregnancy, antepartum; Carpal tunnel syndrome during pregnancy; and GBS (group B Streptococcus carrier), +RV culture, currently pregnant on her problem list.  Patient reports irregular painful cramps/contractions 2-3x/hour.  Contractions: Irregular. Vag. Bleeding: None.  Movement: Present. Denies leaking of fluid.   The following portions of the patient's history were reviewed and updated as appropriate: allergies, current medications, past family history, past medical history, past social history, past surgical history and problem list. Problem list updated.  Objective:   Filed Vitals:   09/17/15 1109  BP: 111/75  Pulse: 101  Temp: 98.6 F (37 C)  Weight: 182 lb 11.2 oz (82.872 kg)    Fetal Status: Fetal Heart Rate (bpm): 132 Fundal Height: 38 cm Movement: Present     General:  Alert, oriented and cooperative. Patient is in no acute distress.  Skin: Skin is warm and dry. No rash noted.   Cardiovascular: Normal heart rate noted  Respiratory: Normal respiratory effort, no problems with respiration noted  Abdomen: Soft, gravid, appropriate for gestational age. Pain/Pressure: Present     Pelvic: Vag. Bleeding: None Vag D/C Character: Mucous   Cervical exam performed Dilation: Closed Effacement (%): 50 Station: -3  Extremities: Normal range of motion.     Mental Status: Normal mood and affect. Normal behavior. Normal judgment and thought content.   Urinalysis: Urine Protein: Negative Urine Glucose: Negative  Assessment and Plan:  Pregnancy: G3P2002 at 7333w0d  1. Previous cesarean section complicating pregnancy --Repeat C/S scheduled next week.  2.  Abdominal pain affecting pregnancy, antepartum --Cervix closed today, labor precautions/warning signs reviewed.   Term labor symptoms and general obstetric precautions including but not limited to vaginal bleeding, contractions, leaking of fluid and fetal movement were reviewed in detail with the patient. Please refer to After Visit Summary for other counseling recommendations.  Return in about 6 weeks (around 10/29/2015) for PP visit.  C/S on 3/15.   Hurshel PartyLisa A Leftwich-Kirby, CNM

## 2015-09-17 NOTE — Progress Notes (Signed)
Rpt C/S scheduled 3/15

## 2015-09-22 ENCOUNTER — Encounter (HOSPITAL_COMMUNITY)
Admission: RE | Admit: 2015-09-22 | Discharge: 2015-09-22 | Disposition: A | Discharge status: Home / Self Care | Payer: Medicaid Other | Source: Ambulatory Visit | Attending: Obstetrics and Gynecology | Admitting: Obstetrics and Gynecology

## 2015-09-22 ENCOUNTER — Encounter (HOSPITAL_COMMUNITY): Payer: Self-pay

## 2015-09-22 VITALS — BP 118/88 | HR 88 | Temp 98.8°F | Resp 16 | Ht 67.0 in

## 2015-09-22 DIAGNOSIS — Z01812 Encounter for preprocedural laboratory examination: Secondary | ICD-10-CM | POA: Insufficient documentation

## 2015-09-22 DIAGNOSIS — G5600 Carpal tunnel syndrome, unspecified upper limb: Secondary | ICD-10-CM

## 2015-09-22 DIAGNOSIS — O34219 Maternal care for unspecified type scar from previous cesarean delivery: Secondary | ICD-10-CM

## 2015-09-22 DIAGNOSIS — Z98891 History of uterine scar from previous surgery: Secondary | ICD-10-CM | POA: Diagnosis not present

## 2015-09-22 DIAGNOSIS — O26899 Other specified pregnancy related conditions, unspecified trimester: Secondary | ICD-10-CM

## 2015-09-22 DIAGNOSIS — O99820 Streptococcus B carrier state complicating pregnancy: Secondary | ICD-10-CM

## 2015-09-22 HISTORY — DX: Other specified pregnancy related conditions, unspecified trimester: O26.899

## 2015-09-22 HISTORY — DX: Heartburn: R12

## 2015-09-22 LAB — CBC
HEMATOCRIT: 33.9 % — AB (ref 36.0–46.0)
HEMOGLOBIN: 11 g/dL — AB (ref 12.0–15.0)
MCH: 28.1 pg (ref 26.0–34.0)
MCHC: 32.4 g/dL (ref 30.0–36.0)
MCV: 86.5 fL (ref 78.0–100.0)
PLATELETS: 375 10*3/uL (ref 150–400)
RBC: 3.92 MIL/uL (ref 3.87–5.11)
RDW: 14.3 % (ref 11.5–15.5)
WBC: 11.5 10*3/uL — AB (ref 4.0–10.5)

## 2015-09-22 LAB — TYPE AND SCREEN
ABO/RH(D): A POS
Antibody Screen: NEGATIVE

## 2015-09-22 NOTE — Patient Instructions (Signed)
Your procedure is scheduled on:  Wednesday September 24, 2015  Enter through the Main Entrance at : 900 am Pick up desk phone and dial [phone] and inform us of your arrival.  Please call [phone] if you have any problems the morning of surgery.  Remember: Do not eat food or drink liquids, including water, after midnight: Tuesday Clear liquids are ok until: 6am Wednesday   You may brush your teeth the morning of surgery.  Take these meds the morning of surgery with a sip of water: none  DO NOT wear jewelry, eye make-up, lipstick,body lotion, or dark fingernail polish.  (Polished toes are ok) You may wear deodorant.  If you are to be admitted after surgery, leave suitcase in car until your room has been assigned. Patients discharged on the day of surgery will not be allowed to drive home. Wear loose fitting, comfortable clothes for your ride home.

## 2015-09-23 LAB — RPR: RPR: NONREACTIVE

## 2015-09-24 ENCOUNTER — Encounter (HOSPITAL_COMMUNITY): Payer: Self-pay | Admitting: Obstetrics and Gynecology

## 2015-09-24 ENCOUNTER — Inpatient Hospital Stay (HOSPITAL_COMMUNITY): Payer: Medicaid Other | Admitting: Anesthesiology

## 2015-09-24 ENCOUNTER — Inpatient Hospital Stay (HOSPITAL_COMMUNITY)
Admission: RE | Admit: 2015-09-24 | Discharge: 2015-09-26 | DRG: 766 | Disposition: A | Discharge status: Home / Self Care | Payer: Medicaid Other | Source: Ambulatory Visit | Attending: Obstetrics and Gynecology | Admitting: Obstetrics and Gynecology

## 2015-09-24 ENCOUNTER — Encounter (HOSPITAL_COMMUNITY): Payer: Self-pay

## 2015-09-24 ENCOUNTER — Encounter (HOSPITAL_COMMUNITY)
Admission: RE | Disposition: A | Discharge status: Home / Self Care | Payer: Self-pay | Source: Ambulatory Visit | Attending: Obstetrics and Gynecology

## 2015-09-24 DIAGNOSIS — O26899 Other specified pregnancy related conditions, unspecified trimester: Secondary | ICD-10-CM

## 2015-09-24 DIAGNOSIS — Z98891 History of uterine scar from previous surgery: Secondary | ICD-10-CM

## 2015-09-24 DIAGNOSIS — Z3A39 39 weeks gestation of pregnancy: Secondary | ICD-10-CM

## 2015-09-24 DIAGNOSIS — O99824 Streptococcus B carrier state complicating childbirth: Secondary | ICD-10-CM | POA: Diagnosis present

## 2015-09-24 DIAGNOSIS — O34219 Maternal care for unspecified type scar from previous cesarean delivery: Secondary | ICD-10-CM | POA: Diagnosis present

## 2015-09-24 DIAGNOSIS — Z8249 Family history of ischemic heart disease and other diseases of the circulatory system: Secondary | ICD-10-CM | POA: Diagnosis not present

## 2015-09-24 DIAGNOSIS — O34211 Maternal care for low transverse scar from previous cesarean delivery: Principal | ICD-10-CM | POA: Diagnosis present

## 2015-09-24 DIAGNOSIS — Z302 Encounter for sterilization: Secondary | ICD-10-CM

## 2015-09-24 DIAGNOSIS — O99820 Streptococcus B carrier state complicating pregnancy: Secondary | ICD-10-CM

## 2015-09-24 DIAGNOSIS — G5600 Carpal tunnel syndrome, unspecified upper limb: Secondary | ICD-10-CM

## 2015-09-24 SURGERY — Surgical Case
Anesthesia: Spinal | Site: Abdomen

## 2015-09-24 MED ORDER — NALBUPHINE HCL 10 MG/ML IJ SOLN: 5.0000 mg | INTRAMUSCULAR | Status: DC | PRN

## 2015-09-24 MED ORDER — PHENYLEPHRINE 8 MG IN D5W 100 ML (0.08MG/ML) PREMIX OPTIME
INJECTION | INTRAVENOUS | Status: AC
  Filled 2015-09-24: qty 100

## 2015-09-24 MED ORDER — ONDANSETRON HCL 4 MG/2ML IJ SOLN
INTRAMUSCULAR | Status: DC | PRN
  Administered 2015-09-24: 4 mg via INTRAVENOUS

## 2015-09-24 MED ORDER — OXYTOCIN 10 UNIT/ML IJ SOLN
40.0000 [IU] | INTRAMUSCULAR | Status: DC | PRN
  Administered 2015-09-24: 40 [IU] via INTRAVENOUS

## 2015-09-24 MED ORDER — DIPHENHYDRAMINE HCL 25 MG PO CAPS
25.0000 mg | ORAL_CAPSULE | ORAL | Status: DC | PRN
  Administered 2015-09-24 – 2015-09-25 (×4): 25 mg via ORAL
  Filled 2015-09-24 (×4): qty 1

## 2015-09-24 MED ORDER — OXYCODONE HCL 5 MG PO TABS
5.0000 mg | ORAL_TABLET | ORAL | Status: DC | PRN
Start: 2015-09-24 — End: 2015-09-26
  Administered 2015-09-25: 5 mg via ORAL
  Filled 2015-09-24 (×2): qty 1

## 2015-09-24 MED ORDER — METOCLOPRAMIDE HCL 5 MG/ML IJ SOLN
INTRAMUSCULAR | Status: AC
  Filled 2015-09-24: qty 2

## 2015-09-24 MED ORDER — SODIUM CHLORIDE 0.9% FLUSH: 3.0000 mL | INTRAVENOUS | Status: DC | PRN

## 2015-09-24 MED ORDER — NALOXONE HCL 0.4 MG/ML IJ SOLN: 0.4000 mg | INTRAMUSCULAR | Status: DC | PRN

## 2015-09-24 MED ORDER — ACETAMINOPHEN 500 MG PO TABS
1000.0000 mg | ORAL_TABLET | Freq: Four times a day (QID) | ORAL | Status: AC
  Administered 2015-09-24 – 2015-09-25 (×3): 1000 mg via ORAL
  Filled 2015-09-24 (×3): qty 2

## 2015-09-24 MED ORDER — FENTANYL CITRATE (PF) 100 MCG/2ML IJ SOLN
25.0000 ug | INTRAMUSCULAR | Status: DC | PRN
Start: 2015-09-24 — End: 2015-09-24

## 2015-09-24 MED ORDER — FENTANYL CITRATE (PF) 100 MCG/2ML IJ SOLN
INTRAMUSCULAR | Status: DC | PRN
  Administered 2015-09-24: 10 ug via INTRATHECAL

## 2015-09-24 MED ORDER — NALOXONE HCL 2 MG/2ML IJ SOSY
1.0000 ug/kg/h | PREFILLED_SYRINGE | INTRAVENOUS | Status: DC | PRN
  Filled 2015-09-24: qty 2

## 2015-09-24 MED ORDER — OXYTOCIN 10 UNIT/ML IJ SOLN
INTRAMUSCULAR | Status: AC
  Filled 2015-09-24: qty 4

## 2015-09-24 MED ORDER — LACTATED RINGERS IV SOLN
Freq: Once | INTRAVENOUS | Status: AC
  Administered 2015-09-24: 09:00:00 via INTRAVENOUS

## 2015-09-24 MED ORDER — CEFAZOLIN SODIUM-DEXTROSE 2-3 GM-% IV SOLR
2.0000 g | INTRAVENOUS | Status: AC
  Administered 2015-09-24: 2 g via INTRAVENOUS

## 2015-09-24 MED ORDER — SIMETHICONE 80 MG PO CHEW
80.0000 mg | CHEWABLE_TABLET | ORAL | Status: DC
  Administered 2015-09-25 – 2015-09-26 (×2): 80 mg via ORAL
  Filled 2015-09-24 (×3): qty 1

## 2015-09-24 MED ORDER — ONDANSETRON HCL 4 MG/2ML IJ SOLN
INTRAMUSCULAR | Status: AC
  Filled 2015-09-24: qty 2

## 2015-09-24 MED ORDER — SODIUM CHLORIDE 0.9 % IR SOLN
Status: DC | PRN
  Administered 2015-09-24: 1000 mL

## 2015-09-24 MED ORDER — KETOROLAC TROMETHAMINE 30 MG/ML IJ SOLN: 30.0000 mg | Freq: Four times a day (QID) | INTRAMUSCULAR | Status: AC | PRN

## 2015-09-24 MED ORDER — SIMETHICONE 80 MG PO CHEW: 80.0000 mg | CHEWABLE_TABLET | ORAL | Status: DC | PRN

## 2015-09-24 MED ORDER — SCOPOLAMINE 1 MG/3DAYS TD PT72
MEDICATED_PATCH | TRANSDERMAL | Status: AC
  Filled 2015-09-24: qty 1

## 2015-09-24 MED ORDER — SENNOSIDES-DOCUSATE SODIUM 8.6-50 MG PO TABS
2.0000 | ORAL_TABLET | ORAL | Status: DC
  Administered 2015-09-25 – 2015-09-26 (×2): 2 via ORAL
  Filled 2015-09-24 (×2): qty 2

## 2015-09-24 MED ORDER — CEFAZOLIN SODIUM-DEXTROSE 2-3 GM-% IV SOLR
INTRAVENOUS | Status: AC
  Filled 2015-09-24: qty 50

## 2015-09-24 MED ORDER — FENTANYL CITRATE (PF) 100 MCG/2ML IJ SOLN
INTRAMUSCULAR | Status: AC
  Filled 2015-09-24: qty 2

## 2015-09-24 MED ORDER — METOCLOPRAMIDE HCL 5 MG/ML IJ SOLN
INTRAMUSCULAR | Status: DC | PRN
  Administered 2015-09-24: 10 mg via INTRAVENOUS

## 2015-09-24 MED ORDER — LACTATED RINGERS IV SOLN
INTRAVENOUS | Status: DC
  Administered 2015-09-24: 14:00:00 via INTRAVENOUS

## 2015-09-24 MED ORDER — TETANUS-DIPHTH-ACELL PERTUSSIS 5-2.5-18.5 LF-MCG/0.5 IM SUSP: 0.5000 mL | Freq: Once | INTRAMUSCULAR | Status: DC

## 2015-09-24 MED ORDER — MENTHOL 3 MG MT LOZG: 1.0000 | LOZENGE | OROMUCOSAL | Status: DC | PRN

## 2015-09-24 MED ORDER — PHENYLEPHRINE 8 MG IN D5W 100 ML (0.08MG/ML) PREMIX OPTIME
INJECTION | INTRAVENOUS | Status: DC | PRN
  Administered 2015-09-24: 60 ug/min via INTRAVENOUS

## 2015-09-24 MED ORDER — ONDANSETRON HCL 4 MG/2ML IJ SOLN: 4.0000 mg | Freq: Three times a day (TID) | INTRAMUSCULAR | Status: DC | PRN

## 2015-09-24 MED ORDER — PRENATAL MULTIVITAMIN CH
1.0000 | ORAL_TABLET | Freq: Every day | ORAL | Status: DC
  Administered 2015-09-25 – 2015-09-26 (×2): 1 via ORAL
  Filled 2015-09-24 (×2): qty 1

## 2015-09-24 MED ORDER — DIPHENHYDRAMINE HCL 25 MG PO CAPS
25.0000 mg | ORAL_CAPSULE | Freq: Four times a day (QID) | ORAL | Status: DC | PRN
  Filled 2015-09-24: qty 1

## 2015-09-24 MED ORDER — KETOROLAC TROMETHAMINE 30 MG/ML IJ SOLN
30.0000 mg | Freq: Four times a day (QID) | INTRAMUSCULAR | Status: AC | PRN
  Administered 2015-09-24: 30 mg via INTRAMUSCULAR

## 2015-09-24 MED ORDER — DIBUCAINE 1 % RE OINT: 1.0000 "application " | TOPICAL_OINTMENT | RECTAL | Status: DC | PRN

## 2015-09-24 MED ORDER — ACETAMINOPHEN 325 MG PO TABS
650.0000 mg | ORAL_TABLET | ORAL | Status: DC | PRN
  Administered 2015-09-25 – 2015-09-26 (×2): 650 mg via ORAL
  Filled 2015-09-24 (×2): qty 2

## 2015-09-24 MED ORDER — KETOROLAC TROMETHAMINE 30 MG/ML IJ SOLN
INTRAMUSCULAR | Status: AC
  Filled 2015-09-24: qty 1

## 2015-09-24 MED ORDER — SCOPOLAMINE 1 MG/3DAYS TD PT72
1.0000 | MEDICATED_PATCH | Freq: Once | TRANSDERMAL | Status: DC
  Administered 2015-09-24: 1.5 mg via TRANSDERMAL

## 2015-09-24 MED ORDER — ERYTHROMYCIN 5 MG/GM OP OINT
TOPICAL_OINTMENT | OPHTHALMIC | Status: AC
  Filled 2015-09-24: qty 1

## 2015-09-24 MED ORDER — IBUPROFEN 600 MG PO TABS
600.0000 mg | ORAL_TABLET | Freq: Four times a day (QID) | ORAL | Status: DC
  Administered 2015-09-24 – 2015-09-26 (×8): 600 mg via ORAL
  Filled 2015-09-24 (×8): qty 1

## 2015-09-24 MED ORDER — LANOLIN HYDROUS EX OINT: 1.0000 "application " | TOPICAL_OINTMENT | CUTANEOUS | Status: DC | PRN

## 2015-09-24 MED ORDER — SIMETHICONE 80 MG PO CHEW
80.0000 mg | CHEWABLE_TABLET | Freq: Three times a day (TID) | ORAL | Status: DC
  Administered 2015-09-25 – 2015-09-26 (×5): 80 mg via ORAL
  Filled 2015-09-24 (×5): qty 1

## 2015-09-24 MED ORDER — DIPHENHYDRAMINE HCL 50 MG/ML IJ SOLN: 12.5000 mg | INTRAMUSCULAR | Status: DC | PRN

## 2015-09-24 MED ORDER — NALBUPHINE HCL 10 MG/ML IJ SOLN
5.0000 mg | INTRAMUSCULAR | Status: DC | PRN
Start: 2015-09-24 — End: 2015-09-26

## 2015-09-24 MED ORDER — NALBUPHINE HCL 10 MG/ML IJ SOLN
5.0000 mg | Freq: Once | INTRAMUSCULAR | Status: AC | PRN
  Administered 2015-09-24: 5 mg via INTRAVENOUS

## 2015-09-24 MED ORDER — OXYCODONE HCL 5 MG PO TABS
10.0000 mg | ORAL_TABLET | ORAL | Status: DC | PRN
  Administered 2015-09-25 – 2015-09-26 (×2): 10 mg via ORAL
  Filled 2015-09-24 (×2): qty 2

## 2015-09-24 MED ORDER — LACTATED RINGERS IV SOLN
INTRAVENOUS | Status: DC | PRN
  Administered 2015-09-24 (×3): via INTRAVENOUS

## 2015-09-24 MED ORDER — MEPERIDINE HCL 25 MG/ML IJ SOLN: 6.2500 mg | INTRAMUSCULAR | Status: DC | PRN

## 2015-09-24 MED ORDER — WITCH HAZEL-GLYCERIN EX PADS
1.0000 | MEDICATED_PAD | CUTANEOUS | Status: DC | PRN
Start: 2015-09-24 — End: 2015-09-26

## 2015-09-24 MED ORDER — NALBUPHINE HCL 10 MG/ML IJ SOLN: 5.0000 mg | Freq: Once | INTRAMUSCULAR | Status: AC | PRN

## 2015-09-24 MED ORDER — SCOPOLAMINE 1 MG/3DAYS TD PT72: 1.0000 | MEDICATED_PATCH | Freq: Once | TRANSDERMAL | Status: DC

## 2015-09-24 MED ORDER — MORPHINE SULFATE (PF) 0.5 MG/ML IJ SOLN
INTRAMUSCULAR | Status: DC | PRN
  Administered 2015-09-24: .2 mg via EPIDURAL

## 2015-09-24 MED ORDER — NALBUPHINE HCL 10 MG/ML IJ SOLN
INTRAMUSCULAR | Status: AC
  Filled 2015-09-24: qty 1

## 2015-09-24 MED ORDER — BUPIVACAINE IN DEXTROSE 0.75-8.25 % IT SOLN
INTRATHECAL | Status: DC | PRN
  Administered 2015-09-24: 1.6 mg via INTRATHECAL

## 2015-09-24 MED ORDER — ONDANSETRON HCL 4 MG/2ML IJ SOLN: 4.0000 mg | Freq: Once | INTRAMUSCULAR | Status: DC | PRN

## 2015-09-24 MED ORDER — MORPHINE SULFATE (PF) 0.5 MG/ML IJ SOLN
INTRAMUSCULAR | Status: AC
  Filled 2015-09-24: qty 10

## 2015-09-24 MED ORDER — OXYTOCIN 10 UNIT/ML IJ SOLN: 2.5000 [IU]/h | INTRAVENOUS | Status: AC

## 2015-09-24 SURGICAL SUPPLY — 32 items
BENZOIN TINCTURE PRP APPL 2/3 (GAUZE/BANDAGES/DRESSINGS) ×3 IMPLANT
CLAMP CORD UMBIL (MISCELLANEOUS) IMPLANT
CLOSURE WOUND 1/2 X4 (GAUZE/BANDAGES/DRESSINGS) ×1
CONTAINER PREFILL 10% NBF 15ML (MISCELLANEOUS) ×6 IMPLANT
DRSG OPSITE POSTOP 4X10 (GAUZE/BANDAGES/DRESSINGS) ×3 IMPLANT
DRSG OPSITE POSTOP 4X12 (GAUZE/BANDAGES/DRESSINGS) ×3 IMPLANT
DURAPREP 26ML APPLICATOR (WOUND CARE) ×3 IMPLANT
ELECT REM PT RETURN 9FT ADLT (ELECTROSURGICAL) ×3
ELECTRODE REM PT RTRN 9FT ADLT (ELECTROSURGICAL) ×1 IMPLANT
EXTRACTOR VACUUM M CUP 4 TUBE (SUCTIONS) IMPLANT
EXTRACTOR VACUUM M CUP 4' TUBE (SUCTIONS)
GLOVE BIOGEL PI IND STRL 6.5 (GLOVE) ×1 IMPLANT
GLOVE BIOGEL PI IND STRL 7.0 (GLOVE) ×1 IMPLANT
GLOVE BIOGEL PI INDICATOR 6.5 (GLOVE) ×2
GLOVE BIOGEL PI INDICATOR 7.0 (GLOVE) ×2
GLOVE SURG SS PI 6.0 STRL IVOR (GLOVE) ×3 IMPLANT
GOWN STRL REUS W/TWL LRG LVL3 (GOWN DISPOSABLE) ×6 IMPLANT
KIT ABG SYR 3ML LUER SLIP (SYRINGE) IMPLANT
NEEDLE HYPO 25X5/8 SAFETYGLIDE (NEEDLE) IMPLANT
NS IRRIG 1000ML POUR BTL (IV SOLUTION) ×3 IMPLANT
PACK C SECTION WH (CUSTOM PROCEDURE TRAY) ×3 IMPLANT
PAD OB MATERNITY 4.3X12.25 (PERSONAL CARE ITEMS) ×3 IMPLANT
PENCIL SMOKE EVAC W/HOLSTER (ELECTROSURGICAL) IMPLANT
RTRCTR C-SECT PINK 25CM LRG (MISCELLANEOUS) ×3 IMPLANT
SEPRAFILM MEMBRANE 5X6 (MISCELLANEOUS) IMPLANT
SPONGE GAUZE 4X4 12PLY STER LF (GAUZE/BANDAGES/DRESSINGS) ×6 IMPLANT
STRIP CLOSURE SKIN 1/2X4 (GAUZE/BANDAGES/DRESSINGS) ×2 IMPLANT
SUT PLAIN 0 NONE (SUTURE) ×3 IMPLANT
SUT VIC AB 0 CT1 36 (SUTURE) ×12 IMPLANT
SUT VIC AB 4-0 KS 27 (SUTURE) ×3 IMPLANT
TOWEL OR 17X24 6PK STRL BLUE (TOWEL DISPOSABLE) ×3 IMPLANT
TRAY FOLEY CATH SILVER 14FR (SET/KITS/TRAYS/PACK) ×3 IMPLANT

## 2015-09-24 NOTE — H&P (Signed)
LABOR AND DELIVERY ADMISSION HISTORY AND PHYSICAL NOTE  Miranda Bowen is a 30 y.o. female [phone] with IUP at [redacted]w[redacted]d by L/6 presenting for elective scheduled repeat. Hx c/s x2.   She reports positive fetal movement. She denies leakage of fluid or vaginal bleeding.  Prenatal History/Complications:  Past Medical History: Past Medical History  Diagnosis Date  . No pertinent past medical history   . Medical history non-contributory   . Heartburn during pregnancy     Past Surgical History: Past Surgical History  Procedure Laterality Date  . Cesarean section N/A 03/26/2014    Procedure: CESAREAN SECTION;  Surgeon: Allie Bossier, MD;  Location: [facility];  Service: Obstetrics;  Laterality: N/A;  . Cesarean section      Obstetrical History: OB History    Gravida Para Term Preterm AB TAB SAB Ectopic Multiple Living   0 0 0 0 0  2      Social History: Social History   Social History  . Marital Status: Single    Spouse Name: N/A  . Number of Children: N/A  . Years of Education: N/A   Social History Main Topics  . Smoking status: Never Smoker   . Smokeless tobacco: Never Used  . Alcohol Use: No  . Drug Use: No  . Sexual Activity: Not Currently    Birth Control/ Protection: None   Other Topics Concern  . None   Social History Narrative   ** Merged History Encounter **        Family History: Family History  Problem Relation Age of Onset  . Hypertension Mother     Allergies: No Known Allergies  Prescriptions prior to admission  Medication Sig Dispense Refill Last Dose  . acetaminophen (TYLENOL) 500 MG tablet Take 500 mg by mouth every 6 (six) hours as needed for moderate pain.   Past Month at Unknown time  . Prenatal Multivit-Min-Fe-FA (PRENATAL VITAMINS) 0.8 MG tablet Take 1 tablet by mouth daily. 30 tablet 12 Past Week at Unknown time  . cyclobenzaprine (FLEXERIL) 10 MG tablet Take 1 tablet (10 mg total) by mouth 3 (three) times daily as needed for muscle  spasms. (Patient not taking: Reported on 09/10/2015) 30 tablet 0 Not Taking at Unknown time     Review of Systems   All systems reviewed and negative except as stated in HPI  Blood pressure 116/84, pulse 103, temperature 98.2 F (36.8 C), temperature source Oral, resp. rate 18, last menstrual period 12/25/2014, SpO2 100 %, not currently breastfeeding. General appearance: alert, cooperative and appears stated age Lungs: clear to auscultation bilaterally Heart: regular rate and rhythm Abdomen: soft, non-tender; bowel sounds normal Extremities: No calf swelling or tenderness      Prenatal labs: ABO, Rh: --/--/A POS (03/13 1000) Antibody: NEG (03/13 1000) Rubella: !Error!imm RPR: Non Reactive (03/13 1000)  HBsAg: NEGATIVE (09/21 1030)  HIV: Non Reactive (07/29 2205)  GBS:   pos 1 hr Glucola: 143, 3 hr 81/171/109/92 Genetic screening:  declined Anatomy US: wnl  Prenatal Transfer Tool  Maternal Diabetes: No Genetic Screening: Declined Maternal Ultrasounds/Referrals: Normal Fetal Ultrasounds or other Referrals:  None Maternal Substance Abuse:  No Significant Maternal Medications:  None Significant Maternal Lab Results: Lab values include: Group B Strep positive  No results found for this or any previous visit (from the past 24 hour(s)).  Patient Active Problem List   Diagnosis Date Noted  . GBS (group B Streptococcus carrier), +RV culture, currently pregnant 09/03/2015  . Carpal  tunnel syndrome during pregnancy 08/20/2015  . Beta hemolytic Streptococcus UTI complicating pregnancy, antepartum 04/14/2015  . H/O cesarean section 04/02/2015  . Supervision of normal intrauterine pregnancy in multigravida in second trimester 04/02/2015    Assessment: Miranda Bowen is a 30 y.o. G3P2002 at 8180w0d here for elective repeat c/s  #Labor: The risks of cesarean section were discussed with the patient including but were not limited to: bleeding which may require transfusion or  reoperation; infection which may require antibiotics; injury to bowel, bladder, ureters or other surrounding organs; injury to the fetus; need for additional procedures including hysterectomy in the event of a life-threatening hemorrhage; placental abnormalities wth subsequent pregnancies, incisional problems, thromboembolic phenomenon and other postoperative/anesthesia complications.  Patient also desires permanent sterilization.  Other reversible forms of contraception were discussed with patient; she declines all other modalities. Risks of procedure discussed with patient including but not limited to: risk of regret, permanence of method, bleeding, infection, injury to surrounding organs and need for additional procedures.  Failure risk of 1-2% with increased risk of ectopic gestation if pregnancy occurs was also discussed with patient.  The patient concurred with the proposed plan, giving informed written consent for the procedures. Ancef 2 g. H 11, plts 375  #Pain: spinal #ID:  gbs pos, no indication for tx #MOF: breast #MOC: btl, papers signed #Circ:  n/a  Silvano Bilisoah B Sae Handrich 09/24/2015, 10:19 AM

## 2015-09-24 NOTE — Anesthesia Procedure Notes (Signed)
Spinal Patient location during procedure: OR Staffing Anesthesiologist: Meesha Sek Performed by: anesthesiologist  Preanesthetic Checklist Completed: patient identified, site marked, surgical consent, pre-op evaluation, timeout performed, IV checked, risks and benefits discussed and monitors and equipment checked Spinal Block Patient position: sitting Prep: ChloraPrep Patient monitoring: continuous pulse ox, blood pressure and heart rate Approach: midline Location: L3-4 Injection technique: single-shot Needle Needle type: Pencan  Needle gauge: 24 G Needle length: 9 cm Additional Notes Functioning IV was confirmed and monitors were applied. Sterile prep and drape, including hand hygiene, mask and sterile gloves were used. The patient was positioned and the spine was prepped. The skin was anesthetized with lidocaine.  Free flow of clear CSF was obtained prior to injecting local anesthetic into the CSF.  The spinal needle aspirated freely following injection.  The needle was carefully withdrawn.  The patient tolerated the procedure well. Consent was obtained prior to procedure with all questions answered and concerns addressed. Risks including but not limited to bleeding, infection, nerve damage, paralysis, failed block, inadequate analgesia, allergic reaction, high spinal, itching and headache were discussed and the patient wished to proceed.   Saulo Anthis, MD     

## 2015-09-24 NOTE — Progress Notes (Signed)
UR chart review completed.  

## 2015-09-24 NOTE — Anesthesia Postprocedure Evaluation (Signed)
Anesthesia Post Note  Patient: Miranda Bowen  Procedure(s) Performed: Procedure(s) (LRB): CESAREAN SECTION (N/A)  Patient location during evaluation: PACU Anesthesia Type: Spinal Level of consciousness: awake and alert Pain management: pain level controlled Vital Signs Assessment: post-procedure vital signs reviewed and stable Respiratory status: spontaneous breathing, nonlabored ventilation, respiratory function stable and patient connected to nasal cannula oxygen Cardiovascular status: blood pressure returned to baseline and stable Postop Assessment: no signs of nausea or vomiting, spinal receding and patient able to bend at knees Anesthetic complications: no    Last Vitals:  Filed Vitals:   09/24/15 1323 09/24/15 1426  BP: 102/56 95/62  Pulse: 70 75  Temp: 36.6 C 36.7 C  Resp: 16 18    Last Pain:  Filed Vitals:   09/24/15 1431  PainSc: 0-No pain                 Marylon Verno JENNETTE

## 2015-09-24 NOTE — Op Note (Signed)
Miranda Bowen PROCEDURE DATE: 09/24/2015  PREOPERATIVE DIAGNOSIS: Intrauterine pregnancy at  4422w0d weeks gestation; previous uterine incision kerr x2 and desire for permanent sterilization  POSTOPERATIVE DIAGNOSIS: The same  PROCEDURE:     Cesarean Section and bilateral tubal ligation using modified Pomeroy procedure  SURGEON:  Dr. Catalina AntiguaPeggy Caleigha Zale  ASSISTANT: Dr. Ashok PallWouk  INDICATIONS: Miranda Bowen is a 30 y.o. [phone] at 1222w0d scheduled for cesarean section secondary to previous uterine incision kerr x2.  The risks of cesarean section discussed with the patient included but were not limited to: bleeding which may require transfusion or reoperation; infection which may require antibiotics; injury to bowel, bladder, ureters or other surrounding organs; injury to the fetus; need for additional procedures including hysterectomy in the event of a life-threatening hemorrhage; placental abnormalities wth subsequent pregnancies, incisional problems, thromboembolic phenomenon and other postoperative/anesthesia complications. The patient concurred with the proposed plan, giving informed written consent for the procedure.  Patient also desires permanent sterilization. Risks and benefits of procedure discussed with patient including permanence of method, bleeding, infection, injury to surrounding organs and need for additional procedures. Risk failure of 0.5-1% with increased risk of ectopic gestation if pregnancy occurs was also discussed with patient.   FINDINGS:  Viable female infant in cephalic presentation.  Apgars 8 and 9.  Clear amniotic fluid.  Intact placenta, three vessel cord.  Normal uterus, fallopian tubes and ovaries bilaterally.  ANESTHESIA:    Spinal INTRAVENOUS FLUIDS:2700 ml ESTIMATED BLOOD LOSS: 800 ml URINE OUTPUT:  150 ml SPECIMENS: Placenta sent to L&D and portions of left and right fallopian tubes sent to pathology COMPLICATIONS: None immediate  PROCEDURE IN DETAIL:  The patient  received intravenous antibiotics and had sequential compression devices applied to her lower extremities while in the preoperative area.  She was then taken to the operating room where anesthesia was induced and was found to be adequate. A foley catheter was placed into her bladder and attached to [name] gravity. She was then placed in a dorsal supine position with a leftward tilt, and prepped and draped in a sterile manner. After an adequate timeout was performed, a Pfannenstiel skin incision was made with scalpel and carried through to the underlying layer of fascia. The fascia was incised in the midline and this incision was extended bilaterally using the Mayo scissors. Kocher clamps were applied to the superior aspect of the fascial incision and the underlying rectus muscles were dissected off bluntly. A similar process was carried out on the inferior aspect of the facial incision. The rectus muscles were separated in the midline bluntly and the peritoneum was entered bluntly. The Alexis self-retaining retractor was introduced into the abdominal cavity. Attention was turned to the lower uterine segment where a transverse hysterotomy was made with a scalpel and extended bilaterally bluntly. The infant was successfully delivered, and cord was clamped and cut and infant was handed over to awaiting neonatology team. Uterine massage was then administered and the placenta delivered intact with three-vessel cord. The uterus was cleared of clot and debris.  The hysterotomy was closed with 0 Vicryl in a running locked fashion, and an imbricating layer was also placed with a 0 Vicryl. Overall, excellent hemostasis was noted. The pelvis copiously irrigated and cleared of all clot and debris. Hemostasis was confirmed on all surfaces.  The patient's left fallopian tube was then identified, brought to the incision, and grasped with a Babcock clamp. The tube was then followed out to the fimbria. The Babcock clamp was then  used to grasp the tube approximately 4 cm from the cornual region. A 3 cm segment of the tube was then ligated with free tie of plain gut suture, transected and excised. Good hemostasis was noted and the tube was returned to the abdomen. The right fallopian tube was then identified to its fimbriated end, ligated, and a 3 cm segment excised in a similar fashion. Excellent hemostasis was noted, and the tube returned to the abdomen. The peritoneum and the muscles were reapproximated using 0 vicryl interrupted stitches. The fascia was then closed using 0 Vicryl in a running fashion.  The subcutaneous layer was reapproximated with plain gut and the skin was closed in a subcuticular fashion using 3.0 Vicryl. The patient tolerated the procedure well. Sponge, lap, instrument and needle counts were correct x 2. She was taken to the recovery room in stable condition.    Itay Mella,PEGGYMD  09/24/2015 11:35 AM

## 2015-09-24 NOTE — Anesthesia Preprocedure Evaluation (Signed)
Anesthesia Evaluation  Patient identified by MRN, date of birth, ID band Patient awake    Reviewed: Allergy & Precautions, NPO status , Patient's Chart, lab work & pertinent test results  History of Anesthesia Complications Negative for: history of anesthetic complications  Airway Mallampati: II  TM Distance: >3 FB Neck ROM: Full    Dental no notable dental hx. (+) Dental Advisory Given   Pulmonary neg pulmonary ROS,    Pulmonary exam normal breath sounds clear to auscultation       Cardiovascular negative cardio ROS Normal cardiovascular exam Rhythm:Regular Rate:Normal     Neuro/Psych negative neurological ROS  negative psych ROS   GI/Hepatic negative GI ROS, Neg liver ROS,   Endo/Other  negative endocrine ROS  Renal/GU negative Renal ROS  negative genitourinary   Musculoskeletal negative musculoskeletal ROS (+)   Abdominal   Peds negative pediatric ROS (+)  Hematology negative hematology ROS (+)   Anesthesia Other Findings   Reproductive/Obstetrics (+) Pregnancy Prior C/S x1                             Anesthesia Physical Anesthesia Plan  ASA: II  Anesthesia Plan: Spinal   Post-op Pain Management:    Induction:   Airway Management Planned:   Additional Equipment:   Intra-op Plan:   Post-operative Plan:   Informed Consent: I have reviewed the patients History and Physical, chart, labs and discussed the procedure including the risks, benefits and alternatives for the proposed anesthesia with the patient or authorized representative who has indicated his/her understanding and acceptance.   Dental advisory given  Plan Discussed with: CRNA  Anesthesia Plan Comments:         Anesthesia Quick Evaluation

## 2015-09-24 NOTE — Transfer of Care (Signed)
Immediate Anesthesia Transfer of Care Note  Patient: Miranda Bowen  Procedure(s) Performed: Procedure(s): CESAREAN SECTION (N/A)  Patient Location: PACU  Anesthesia Type:Spinal  Level of Consciousness: awake, alert , oriented and patient cooperative  Airway & Oxygen Therapy: Patient Spontanous Breathing  Post-op Assessment: Report given to RN and Post -op Vital signs reviewed and stable  Post vital signs: Reviewed and stable  Last Vitals:  Filed Vitals:   09/24/15 0901  BP: 116/84  Pulse: 103  Temp: 36.8 C  Resp: 18    Complications: No apparent anesthesia complications

## 2015-09-25 ENCOUNTER — Encounter (HOSPITAL_COMMUNITY): Payer: Self-pay | Admitting: Obstetrics and Gynecology

## 2015-09-25 LAB — BIRTH TISSUE RECOVERY COLLECTION (PLACENTA DONATION)

## 2015-09-25 LAB — CBC
HEMATOCRIT: 30.4 % — AB (ref 36.0–46.0)
Hemoglobin: 9.9 g/dL — ABNORMAL LOW (ref 12.0–15.0)
MCH: 28 pg (ref 26.0–34.0)
MCHC: 32.6 g/dL (ref 30.0–36.0)
MCV: 86.1 fL (ref 78.0–100.0)
PLATELETS: 335 10*3/uL (ref 150–400)
RBC: 3.53 MIL/uL — ABNORMAL LOW (ref 3.87–5.11)
RDW: 14.4 % (ref 11.5–15.5)
WBC: 11.1 10*3/uL — AB (ref 4.0–10.5)

## 2015-09-25 NOTE — Addendum Note (Signed)
Addendum  created 09/25/15 0945 by Yolonda KidaAlison L Jabe Jeanbaptiste, CRNA   Modules edited: Clinical Notes   Clinical Notes:  File: [id number]

## 2015-09-25 NOTE — Progress Notes (Signed)
POSTPARTUM PROGRESS NOTE  Post Partum Day 1 Subjective:  Miranda Bowen is a 30 y.o. G3P3003 4035w0d POD#1 s/p RLTCS/BTL.  No acute events overnight.  Pt denies problems with ambulating, voiding or po intake.  She denies nausea or vomiting.  Pain is moderately controlled, pt reporting 8/10 pain with ambulation.  She has had flatus. She has had bowel movement, but reports some diarrhea with first BM since surgery.  Lochia Small.   Objective: Blood pressure 103/60, pulse 70, temperature 98.5 F (36.9 C), temperature source Oral, resp. rate 18, last menstrual period 12/25/2014, SpO2 99 %, unknown if currently breastfeeding.  Physical Exam:  General: alert, cooperative and no distress Lochia:normal flow Abdomen: +BS, soft, nontender  Uterine Fundus: firm, 1 cm below umbilicus DVT Evaluation: No calf swelling or tenderness Extremities: No edema   Recent Labs  09/22/15 1000 09/25/15 0523  HGB 11.0* 9.9*  HCT 33.9* 30.4*    Assessment/Plan:  ASSESSMENT: Miranda Bowen is a 30 y.o. G3P3003 5835w0d s/p RLTCS/BTL. No acute events overnight and pt appears comfortable.   Diarrhea: Pt reports loose BM since surgery. I counseled patient to let her nurse know if her next BM are also loose.  POD#1: Vaginal bleeding is minimal and pain is well-controlled when patient is at rest. Continue to monitor bleeding and Hgb. Control pain with 600 mg Ibuprofen Q6H PRN.    Lucretia KernJohn Diehl 09/25/2015, 6:57 AM   CNM attestation Post Partum Day #1  Miranda Bowen is a 30 y.o. G3P3003 s/p rLTCS/BTL.  Pt denies problems with ambulating, voiding or po intake. Pain is well controlled.  Plan for birth control is bilateral tubal ligation.  Method of Feeding: breast  PE:  BP 114/74 mmHg  Pulse 79  Temp(Src) 98.5 F (36.9 C) (Oral)  Resp 20  SpO2 98%  LMP 12/25/2014  Breastfeeding? Unknown Fundus firm  Plan for discharge: 09/26/15  Cam HaiSHAW, Atziry Baranski, CNM 11:42 PM

## 2015-09-25 NOTE — Anesthesia Postprocedure Evaluation (Signed)
Anesthesia Post Note  Patient: Miranda Bowen  Procedure(s) Performed: Procedure(s) (LRB): CESAREAN SECTION (N/A)  Patient location during evaluation: Mother Baby Anesthesia Type: Spinal Level of consciousness: awake, awake and alert, oriented and patient cooperative Pain management: pain level controlled Vital Signs Assessment: post-procedure vital signs reviewed and stable Respiratory status: spontaneous breathing, nonlabored ventilation and respiratory function stable Cardiovascular status: stable Postop Assessment: no headache, no backache, patient able to bend at knees and no signs of nausea or vomiting Anesthetic complications: no    Last Vitals:  Filed Vitals:   09/25/15 0000 09/25/15 0500  BP: 104/62 103/60  Pulse: 77 70  Temp: 37.2 C 36.9 C  Resp: 18 18    Last Pain:  Filed Vitals:   09/25/15 0645  PainSc: 5                  Miranda Bowen

## 2015-09-26 MED ORDER — OXYCODONE HCL 5 MG PO TABS: 5.0000 mg | ORAL_TABLET | ORAL | Status: DC | PRN

## 2015-09-26 MED ORDER — TETANUS-DIPHTH-ACELL PERTUSSIS 5-2.5-18.5 LF-MCG/0.5 IM SUSP
0.5000 mL | Freq: Once | INTRAMUSCULAR | Status: AC
  Administered 2015-09-26: 0.5 mL via INTRAMUSCULAR
  Filled 2015-09-26: qty 0.5

## 2015-09-26 MED ORDER — IBUPROFEN 600 MG PO TABS: 600.0000 mg | ORAL_TABLET | Freq: Four times a day (QID) | ORAL | Status: DC

## 2015-09-26 NOTE — Discharge Summary (Signed)
OB Discharge Summary     Patient Name: Miranda Bowen DOB: 10-20-1985 MRN: [id number]  Date of admission: 09/24/2015 Delivering MD: Catalina AntiguaONSTANT, PEGGY   Date of discharge: 09/26/2015  Admitting diagnosis: cpt 59514 - REPEAT c-section x's 2 Intrauterine pregnancy: 2184w0d     Secondary diagnosis:  Active Problems:   Status post repeat low transverse cesarean section  Additional problems: none     Discharge diagnosis: Term Pregnancy Delivered                                                                                                Post partum procedures:intrapartum BTL  Augmentation: n/a  Complications: None  Hospital course:  Sceduled C/S   30 y.o. yo G3P3003 at 6084w0d was admitted to the hospital 09/24/2015 for scheduled cesarean section with the following indication:Elective Repeat.  Membrane Rupture Time/Date: 10:55 AM ,09/24/2015   Patient delivered a Non Viable infant.09/24/2015  Details of operation can be found in separate operative note.  Pateint had an uncomplicated postpartum course.  She is ambulating, tolerating a regular diet, passing flatus, and urinating well. Patient is discharged home in stable condition on  09/26/2015          Physical exam  Filed Vitals:   09/25/15 0000 09/25/15 0500 09/25/15 1900 09/26/15 0500  BP: 104/62 103/60 109/59 114/74  Pulse: 77 70 89 79  Temp: 99 F (37.2 C) 98.5 F (36.9 C) 98.1 F (36.7 C) 98.5 F (36.9 C)  TempSrc: Oral Oral Oral Oral  Resp: 18 18 20 20   SpO2: 96% 99% 98%    General: alert, cooperative and no distress Lochia: appropriate Uterine Fundus: firm Incision: Healing well with no significant drainage, No significant erythema, Dressing is clean, dry, and intact DVT Evaluation: No evidence of DVT seen on physical exam. Labs: Lab Results  Component Value Date   WBC 11.1* 09/25/2015   HGB 9.9* 09/25/2015   HCT 30.4* 09/25/2015   MCV 86.1 09/25/2015   PLT 335 09/25/2015   CMP Latest Ref Rng 03/26/2014   Glucose 70 - 99 mg/dL -  BUN 6 - 23 mg/dL -  Creatinine 0.860.50 - 5.781.10 mg/dL 4.690.90  Sodium 629135 - 528145 mEq/L -  Potassium 3.5 - 5.1 mEq/L -  Chloride 96 - 112 mEq/L -  CO2 19 - 32 mEq/L -  Calcium 8.4 - 10.5 mg/dL -  Total Protein 6.0 - 8.3 g/dL -  Total Bilirubin 0.3 - 1.2 mg/dL -  Alkaline Phos 39 - 413117 U/L -  AST 0 - 37 U/L -  ALT 0 - 35 U/L -    Discharge instruction: per After Visit Summary and "Baby and Me Booklet".  After visit meds:    Medication List    STOP taking these medications        acetaminophen 500 MG tablet  Commonly known as:  TYLENOL     cyclobenzaprine 10 MG tablet  Commonly known as:  FLEXERIL      TAKE these medications        ibuprofen 600 MG tablet  Commonly known as:  ADVIL,MOTRIN  Take  1 tablet (600 mg total) by mouth every 6 (six) hours.     oxyCODONE 5 MG immediate release tablet  Commonly known as:  Oxy IR/ROXICODONE  Take 1 tablet (5 mg total) by mouth every 4 (four) hours as needed (pain scale 4-7).     Prenatal Vitamins 0.8 MG tablet  Take 1 tablet by mouth daily.        Diet: routine diet  Activity: Advance as tolerated. Pelvic rest for 6 weeks.   Outpatient follow up:6 weeks Follow up Appt:Future Appointments Date Time Provider Department Center  11/05/2015 2:15 PM Tereso Newcomer, MD [facility]   Follow up Visit:No Follow-up on file.  Postpartum contraception: Tubal Ligation  Newborn Data: Live born female  Birth Weight: 6 lb 8.9 oz (2975 g) APGAR: 8, 9  Baby Feeding: Bottle Disposition:home with mother   09/26/2015 Greig Right, CNM

## 2015-09-26 NOTE — Discharge Instructions (Signed)
Cesarean Delivery, Care After  Refer to this sheet in the next few weeks. These instructions provide you with information on caring for yourself after your procedure. Your health care provider may also give you specific instructions. Your treatment has been planned according to current medical practices, but problems sometimes occur. Call your health care provider if you have any problems or questions after you go home.  HOME CARE INSTRUCTIONS   Only take over-the-counter or prescription medications as directed by your health care provider.   Do not drink alcohol, especially if you are breastfeeding or taking medication to relieve pain.   Do not chew or smoke tobacco.   Continue to use good perineal care. Good perineal care includes:    Wiping your perineum from front to back.    Keeping your perineum clean.   Check your surgical cut (incision) daily for increased redness, drainage, swelling, or separation of skin.   Clean your incision gently with soap and water every day, and then pat it dry. If your health care provider says it is okay, leave the incision uncovered. Use a bandage (dressing) if the incision is draining fluid or appears irritated. If the adhesive strips across the incision do not fall off within 7 days, carefully peel them off.   Hug a pillow when coughing or sneezing until your incision is healed. This helps to relieve pain.   Do not use tampons or douche until your health care provider says it is okay.   Shower, wash your hair, and take tub baths as directed by your health care provider.   Wear a well-fitting bra that provides breast support.   Limit wearing support panties or control-top hose.   Drink enough fluids to keep your urine clear or pale yellow.   Eat high-fiber foods such as whole grain cereals and breads, brown rice, beans, and fresh fruits and vegetables every day. These foods may help prevent or relieve constipation.   Resume activities such as climbing stairs,  driving, lifting, exercising, or traveling as directed by your health care provider.   Talk to your health care provider about resuming sexual activities. This is dependent upon your risk of infection, your rate of healing, and your comfort and desire to resume sexual activity.   Try to have someone help you with your household activities and your newborn for at least a few days after you leave the hospital.   Rest as much as possible. Try to rest or take a nap when your newborn is sleeping.   Increase your activities gradually.   Keep all of your scheduled postpartum appointments. It is very important to keep your scheduled follow-up appointments. At these appointments, your health care provider will be checking to make sure that you are healing physically and emotionally.  SEEK MEDICAL CARE IF:    You are passing large clots from your vagina. Save any clots to show your health care provider.   You have a foul smelling discharge from your vagina.   You have trouble urinating.   You are urinating frequently.   You have pain when you urinate.   You have a change in your bowel movements.   You have increasing redness, pain, or swelling near your incision.   You have pus draining from your incision.   Your incision is separating.   You have painful, hard, or reddened breasts.   You have a severe headache.   You have blurred vision or see spots.   You feel sad   or depressed.   You have thoughts of hurting yourself or your newborn.   You have questions about your care, the care of your newborn, or medications.   You are dizzy or light-headed.   You have a rash.   You have pain, redness, or swelling at the site of the removed intravenous access (IV) tube.   You have nausea or vomiting.   You stopped breastfeeding and have not had a menstrual period within 12 weeks of stopping.   You are not breastfeeding and have not had a menstrual period within 12 weeks of delivery.   You have a fever.  SEEK  IMMEDIATE MEDICAL CARE IF:   You have persistent pain.   You have chest pain.   You have shortness of breath.   You faint.   You have leg pain.   You have stomach pain.   Your vaginal bleeding saturates 2 or more sanitary pads in 1 hour.  MAKE SURE YOU:    Understand these instructions.   Will watch your condition.   Will get help right away if you are not doing well or get worse.     This information is not intended to replace advice given to you by your health care provider. Make sure you discuss any questions you have with your health care provider.     Document Released: 03/20/2002 Document Revised: 07/19/2014 Document Reviewed: 02/23/2012  Elsevier Interactive Patient Education 2016 [employer]

## 2015-10-22 DIAGNOSIS — O34219 Maternal care for unspecified type scar from previous cesarean delivery: Secondary | ICD-10-CM | POA: Insufficient documentation

## 2015-10-22 NOTE — Addendum Note (Signed)
Encounter addended by: Tereso NewcomerUgonna A Ahmed Inniss, MD on: 10/22/2015  2:54 PM<BR>     Documentation filed: Problem List, Visit Diagnoses

## 2015-11-05 ENCOUNTER — Encounter: Payer: Self-pay | Admitting: Obstetrics & Gynecology

## 2015-11-05 ENCOUNTER — Ambulatory Visit (INDEPENDENT_AMBULATORY_CARE_PROVIDER_SITE_OTHER): Payer: Medicaid Other | Admitting: Obstetrics & Gynecology

## 2015-11-05 NOTE — Progress Notes (Signed)
Patient ID: Miranda Bowen, female   DOB: 17-Sep-1985, 30 y.o.   MRN: [id number] Subjective:     Miranda Bowen is a 30 y.o. 693P3003 female who presents for a postpartum visit. She is 6 weeks postpartum following a RLTCS and BTL. I have fully reviewed the prenatal and intrapartum course. The delivery was at 39 gestational weeks. Outcome: Cesarean. Anesthesia: Epidural . Postpartum course has been uncomplicated. Baby's course has been uncomplicated. Baby is feeding by bottle. Bleeding not at this. Bowel function is normal. Bladder function is normal. Patient is not sexually active. Contraception method is tubal ligation. Postpartum depression screening: negative.  The following portions of the patient's history were reviewed and updated as appropriate: allergies, current medications, past family history, past medical history, past social history, past surgical history and problem list. Normal pap and negative HRHPV on 04/02/2015.  Review of Systems Pertinent items noted in HPI and remainder of comprehensive ROS otherwise negative.   Objective:    There were no vitals taken for this visit.  General:  alert and no distress   Breasts:  deferred  Lungs: clear to auscultation bilaterally  Heart:  regular rate and rhythm  Abdomen: soft, non-tender; bowel sounds normal; no masses,  no organomegaly.  Incision C/D/I.        Assessment:   Normal postpartum exam. Pap smear not done at today's visit.   Plan:   1. Contraception: tubal ligation 2.  Patient cleared to return to work on 11/24/15 as per her preference; work letter given 3. Follow up as needed.    Jaynie CollinsUGONNA  Jerelyn Trimarco, MD, FACOG Attending Obstetrician & Gynecologist, [facility]

## 2016-06-11 ENCOUNTER — Emergency Department (HOSPITAL_COMMUNITY)
Admission: EM | Admit: 2016-06-11 | Discharge: 2016-06-11 | Disposition: A | Discharge status: Home / Self Care | Payer: No Typology Code available for payment source | Attending: Emergency Medicine | Admitting: Emergency Medicine

## 2016-06-11 ENCOUNTER — Encounter (HOSPITAL_COMMUNITY): Payer: Self-pay

## 2016-06-11 ENCOUNTER — Emergency Department (HOSPITAL_COMMUNITY): Payer: No Typology Code available for payment source

## 2016-06-11 DIAGNOSIS — Y92410 Unspecified street and highway as the place of occurrence of the external cause: Secondary | ICD-10-CM | POA: Diagnosis not present

## 2016-06-11 DIAGNOSIS — S39012A Strain of muscle, fascia and tendon of lower back, initial encounter: Secondary | ICD-10-CM | POA: Diagnosis not present

## 2016-06-11 DIAGNOSIS — Z79899 Other long term (current) drug therapy: Secondary | ICD-10-CM | POA: Diagnosis not present

## 2016-06-11 DIAGNOSIS — S161XXA Strain of muscle, fascia and tendon at neck level, initial encounter: Secondary | ICD-10-CM

## 2016-06-11 DIAGNOSIS — Y999 Unspecified external cause status: Secondary | ICD-10-CM | POA: Diagnosis not present

## 2016-06-11 DIAGNOSIS — Y939 Activity, unspecified: Secondary | ICD-10-CM | POA: Insufficient documentation

## 2016-06-11 DIAGNOSIS — S199XXA Unspecified injury of neck, initial encounter: Secondary | ICD-10-CM | POA: Diagnosis present

## 2016-06-11 LAB — POC URINE PREG, ED: Preg Test, Ur: NEGATIVE

## 2016-06-11 MED ORDER — CYCLOBENZAPRINE HCL 5 MG PO TABS: 5.0000 mg | ORAL_TABLET | Freq: Three times a day (TID) | ORAL | 0 refills | Status: AC | PRN

## 2016-06-11 MED ORDER — IBUPROFEN 600 MG PO TABS: 600.0000 mg | ORAL_TABLET | Freq: Four times a day (QID) | ORAL | 0 refills | Status: DC | PRN

## 2016-06-11 MED ORDER — IBUPROFEN 400 MG PO TABS
600.0000 mg | ORAL_TABLET | Freq: Once | ORAL | Status: AC
  Administered 2016-06-11: 600 mg via ORAL
  Filled 2016-06-11: qty 1

## 2016-06-11 NOTE — ED Notes (Signed)
Patient Alert and oriented X4. Stable and ambulatory. Patient verbalized understanding of the discharge instructions.  Patient belongings were taken by the patient.  
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
## 2016-06-11 NOTE — ED Notes (Signed)
Patient is A&Ox4 at this time.  Patient in no signs of distress.  Please see providers note for complete history and physical exam.  

## 2017-01-30 ENCOUNTER — Emergency Department (HOSPITAL_COMMUNITY): Payer: Self-pay

## 2017-01-30 ENCOUNTER — Emergency Department (HOSPITAL_COMMUNITY)
Admission: EM | Admit: 2017-01-30 | Discharge: 2017-01-30 | Disposition: A | Discharge status: Home / Self Care | Payer: Self-pay | Attending: Emergency Medicine | Admitting: Emergency Medicine

## 2017-01-30 ENCOUNTER — Encounter (HOSPITAL_COMMUNITY): Payer: Self-pay | Admitting: Emergency Medicine

## 2017-01-30 ENCOUNTER — Other Ambulatory Visit: Payer: Self-pay

## 2017-01-30 DIAGNOSIS — R1030 Lower abdominal pain, unspecified: Secondary | ICD-10-CM | POA: Insufficient documentation

## 2017-01-30 DIAGNOSIS — N12 Tubulo-interstitial nephritis, not specified as acute or chronic: Secondary | ICD-10-CM | POA: Insufficient documentation

## 2017-01-30 DIAGNOSIS — R51 Headache: Secondary | ICD-10-CM

## 2017-01-30 DIAGNOSIS — R42 Dizziness and giddiness: Secondary | ICD-10-CM | POA: Insufficient documentation

## 2017-01-30 DIAGNOSIS — R519 Headache, unspecified: Secondary | ICD-10-CM

## 2017-01-30 LAB — CBC WITH DIFFERENTIAL/PLATELET
BASOS ABS: 0 10*3/uL (ref 0.0–0.1)
Basophils Relative: 0 %
EOS PCT: 0 %
Eosinophils Absolute: 0 10*3/uL (ref 0.0–0.7)
HEMATOCRIT: 36.5 % (ref 36.0–46.0)
HEMOGLOBIN: 12.1 g/dL (ref 12.0–15.0)
LYMPHS ABS: 1.4 10*3/uL (ref 0.7–4.0)
LYMPHS PCT: 11 %
MCH: 28.7 pg (ref 26.0–34.0)
MCHC: 33.2 g/dL (ref 30.0–36.0)
MCV: 86.7 fL (ref 78.0–100.0)
Monocytes Absolute: 0.9 10*3/uL (ref 0.1–1.0)
Monocytes Relative: 7 %
NEUTROS ABS: 10.3 10*3/uL — AB (ref 1.7–7.7)
Neutrophils Relative %: 82 %
Platelets: 284 10*3/uL (ref 150–400)
RBC: 4.21 MIL/uL (ref 3.87–5.11)
RDW: 12.6 % (ref 11.5–15.5)
WBC: 12.5 10*3/uL — AB (ref 4.0–10.5)

## 2017-01-30 LAB — COMPREHENSIVE METABOLIC PANEL
ALT: 83 U/L — ABNORMAL HIGH (ref 14–54)
AST: 74 U/L — ABNORMAL HIGH (ref 15–41)
Albumin: 3.4 g/dL — ABNORMAL LOW (ref 3.5–5.0)
Alkaline Phosphatase: 115 U/L (ref 38–126)
Anion gap: 7 (ref 5–15)
BUN: 11 mg/dL (ref 6–20)
CO2: 23 mmol/L (ref 22–32)
Calcium: 8.5 mg/dL — ABNORMAL LOW (ref 8.9–10.3)
Chloride: 105 mmol/L (ref 101–111)
Creatinine, Ser: 1.11 mg/dL — ABNORMAL HIGH (ref 0.44–1.00)
GFR calc Af Amer: >60 mL/min (ref >60)
GFR calc non Af Amer: >60 mL/min (ref >60)
Glucose, Bld: 103 mg/dL — ABNORMAL HIGH (ref 65–99)
Potassium: 3.6 mmol/L (ref 3.5–5.1)
Sodium: 135 mmol/L (ref 135–145)
Total Bilirubin: 1.9 mg/dL — ABNORMAL HIGH (ref 0.3–1.2)
Total Protein: 7.1 g/dL (ref 6.5–8.1)

## 2017-01-30 LAB — URINALYSIS, ROUTINE W REFLEX MICROSCOPIC
Bilirubin Urine: NEGATIVE
GLUCOSE, UA: NEGATIVE mg/dL
KETONES UR: 5 mg/dL — AB
Nitrite: POSITIVE — AB
PROTEIN: 100 mg/dL — AB
RBC / HPF: NONE SEEN RBC/hpf (ref 0–5)
SQUAMOUS EPITHELIAL / LPF: NONE SEEN
Specific Gravity, Urine: 1.014 (ref 1.005–1.030)
pH: 6 (ref 5.0–8.0)

## 2017-01-30 LAB — I-STAT CG4 LACTIC ACID, ED: Lactic Acid, Venous: 0.88 mmol/L (ref 0.5–1.9)

## 2017-01-30 LAB — I-STAT BETA HCG BLOOD, ED (MC, WL, AP ONLY): HCG, QUANTITATIVE: 5.1 m[IU]/mL — AB (ref <5)

## 2017-01-30 MED ORDER — ACETAMINOPHEN 325 MG PO TABS
650.0000 mg | ORAL_TABLET | Freq: Once | ORAL | Status: AC
  Administered 2017-01-30: 650 mg via ORAL
  Filled 2017-01-30: qty 2

## 2017-01-30 MED ORDER — IBUPROFEN 600 MG PO TABS: 600.0000 mg | ORAL_TABLET | Freq: Four times a day (QID) | ORAL | 0 refills | Status: AC | PRN

## 2017-01-30 MED ORDER — SODIUM CHLORIDE 0.9 % IV BOLUS (SEPSIS)
1000.0000 mL | Freq: Once | INTRAVENOUS | Status: AC
  Administered 2017-01-30: 1000 mL via INTRAVENOUS

## 2017-01-30 MED ORDER — DEXTROSE 5 % IV SOLN
2.0000 g | Freq: Once | INTRAVENOUS | Status: AC
  Administered 2017-01-30: 2 g via INTRAVENOUS
  Filled 2017-01-30: qty 2

## 2017-01-30 MED ORDER — CEPHALEXIN 500 MG PO CAPS: 500.0000 mg | ORAL_CAPSULE | Freq: Two times a day (BID) | ORAL | 0 refills | Status: DC

## 2017-01-30 MED ORDER — PROMETHAZINE HCL 25 MG PO TABS: 25.0000 mg | ORAL_TABLET | Freq: Four times a day (QID) | ORAL | 0 refills | Status: AC | PRN

## 2017-01-30 NOTE — ED Triage Notes (Signed)
Pt states she has been feeling dizzy for about a week. Pt has been having headaches, generalized weakness. Pt has not been able to eat since Monday- no appetite. Denies N/v.

## 2017-01-30 NOTE — Progress Notes (Signed)
Pt has HCG of 5.1 Per Dr. Rhunette CroftNanavati this is a false positive and proceed with CT Renal stone scan

## 2017-01-30 NOTE — ED Provider Notes (Addendum)
[facility]-EMERGENCY DEPT Provider Note   CSN: [id number] Arrival date & time: 01/30/17  1527     History   Chief Complaint Chief Complaint  Patient presents with  . Headache  . Dizziness    HPI Miranda Bowen is a 31 y.o. female.  HPI Pt comes in with cc of headache and dizziness. Pt is noted to have a fever. Pt has no serious medical hx. She reports starting to feel dizzy 1 week ago. Dizziness is described as lightheadedness, typically when she gets up. Pt's headache has been present for 2 days. Pt's headache better when eye closed and it is described as 10/10. Pain is frontal and sharp. Pt had nausea last night. Pt denies any active neck pain/ stiffness or new numbness or tingling, seizures, vision changes, slurred speech. Pt has no hx of drug use. She denies any sore throat, cough, chest pain, dib, uti like symptoms or vaginal discharge or bleeding. Pt is complaining of flank pain bilaterally. BM have been normal  Past Medical History:  Diagnosis Date  . Heartburn during pregnancy   . Medical history non-contributory   . No pertinent past medical history     There are no active problems to display for this patient.   Past Surgical History:  Procedure Laterality Date  . CESAREAN SECTION N/A 03/26/2014   Procedure: CESAREAN SECTION;  Surgeon: Allie Bossier, MD;  Location: [facility];  Service: Obstetrics;  Laterality: N/A;  . CESAREAN SECTION    . CESAREAN SECTION N/A 09/24/2015   Procedure: CESAREAN SECTION;  Surgeon: Catalina Antigua, MD;  Location: [facility];  Service: Obstetrics;  Laterality: N/A;    OB History    Gravida Para Term Preterm AB Living   3 3 3  0 0 3   SAB TAB Ectopic Multiple Live Births   0 0 0 0 3       Home Medications    Prior to Admission medications   Medication Sig Start Date End Date Taking? Authorizing Provider  acetaminophen (TYLENOL) 325 MG tablet Take 325-650 mg by mouth every 6 (six) hours as needed (for pain or headaches).   Yes [provider]  cephALEXin (KEFLEX) 500 MG capsule Take 1 capsule (500 mg total) by mouth 2 (two) times daily. 01/30/17   Derwood Kaplan, MD  cyclobenzaprine (FLEXERIL) 5 MG tablet Take 1 tablet (5 mg total) by mouth 3 (three) times daily as needed for muscle spasms. Patient not taking: Reported on 01/30/2017 06/11/16   Janne Napoleon, NP  ibuprofen (ADVIL,MOTRIN) 600 MG tablet Take 1 tablet (600 mg total) by mouth every 6 (six) hours as needed. 01/30/17   Derwood Kaplan, MD  Prenatal Multivit-Min-Fe-FA (PRENATAL VITAMINS) 0.8 MG tablet Take 1 tablet by mouth daily. Patient not taking: Reported on 11/05/2015 02/07/15   Marlis Edelson, CNM  promethazine (PHENERGAN) 25 MG tablet Take 1 tablet (25 mg total) by mouth every 6 (six) hours as needed for nausea. 01/30/17   Derwood Kaplan, MD    Family History Family History  Problem Relation Age of Onset  . Hypertension Mother     Social History Social History  Substance Use Topics  . Smoking status: Never Smoker  . Smokeless tobacco: Never Used  . Alcohol use No     Allergies   Patient has no known allergies.   Review of Systems Review of Systems  Constitutional: Positive for activity change.  Gastrointestinal: Positive for nausea.  Allergic/Immunologic: Negative for immunocompromised state.  Neurological: Positive for  dizziness and headaches.  Hematological: Does not bruise/bleed easily.  All other systems reviewed and are negative.    Physical Exam Updated Vital Signs BP 104/71   Pulse 77   Temp 100.1 F (37.8 C) (Oral)   Resp 18   Ht 5\' 7"  (1.702 m)   LMP 12/27/2016   SpO2 98%   Physical Exam  Constitutional: She is oriented to person, place, and time. She appears well-developed.  HENT:  Head: Normocephalic and atraumatic.  Eyes: EOM are normal.  Neck: Normal range of motion. Neck supple.  Cardiovascular: Normal rate.   Pulmonary/Chest: Effort normal.  Abdominal: Bowel sounds are normal. She exhibits no distension.  There is no rebound and no guarding.  Bilateral flank tenderness  Neurological: She is alert and oriented to person, place, and time.  Skin: Skin is warm and dry.  Nursing note and vitals reviewed.    ED Treatments / Results  Labs (all labs ordered are listed, but only abnormal results are displayed) Labs Reviewed  COMPREHENSIVE METABOLIC PANEL - Abnormal; Notable for the following:       Result Value   Glucose, Bld 103 (*)    Creatinine, Ser 1.11 (*)    Calcium 8.5 (*)    Albumin 3.4 (*)    AST 74 (*)    ALT 83 (*)    Total Bilirubin 1.9 (*)    All other components within normal limits  CBC WITH DIFFERENTIAL/PLATELET - Abnormal; Notable for the following:    WBC 12.5 (*)    Neutro Abs 10.3 (*)    All other components within normal limits  URINALYSIS, ROUTINE W REFLEX MICROSCOPIC - Abnormal; Notable for the following:    Color, Urine AMBER (*)    APPearance CLOUDY (*)    Hgb urine dipstick MODERATE (*)    Ketones, ur 5 (*)    Protein, ur 100 (*)    Nitrite POSITIVE (*)    Leukocytes, UA LARGE (*)    Bacteria, UA MANY (*)    All other components within normal limits  I-STAT BETA HCG BLOOD, ED ([facility], [facility], [facility] ONLY) - Abnormal; Notable for the following:    I-stat hCG, quantitative 5.1 (*)    All other components within normal limits  URINE CULTURE  CSF CULTURE  GRAM STAIN  CSF CELL COUNT WITH DIFFERENTIAL  CSF CELL COUNT WITH DIFFERENTIAL  GLUCOSE, CSF  PROTEIN, CSF  I-STAT CG4 LACTIC ACID, ED    EKG  EKG Interpretation  Date/Time:  Sunday January 30 2017 15:34:56 EDT Ventricular Rate:  114 PR Interval:  166 QRS Duration: 72 QT Interval:  308 QTC Calculation: 424 R Axis:   73 Text Interpretation:  Sinus tachycardia Right atrial enlargement T wave abnormality, consider inferior ischemia T wave abnormality, consider anterior ischemia Abnormal ECG No old tracing to compare No acute changes Confirmed by Derwood Kaplananavati, Charnice Zwilling [phone]([id number]) on 01/30/2017 4:09:54 PM        Radiology Dg Chest 2 View  Result Date: 01/30/2017 CLINICAL DATA:  Fever and cough a couple days. EXAM: CHEST  2 VIEW COMPARISON:  None. FINDINGS: Lungs are clear. Cardiomediastinal silhouette, bones and soft tissues are normal. IMPRESSION: No active cardiopulmonary disease. Electronically Signed   By: Elberta Fortisaniel  Boyle M.D.   On: 01/30/2017 15:53   Ct Renal Stone Study  Result Date: 01/30/2017 CLINICAL DATA:  Bilateral flank pain. EXAM: CT ABDOMEN AND PELVIS WITHOUT CONTRAST TECHNIQUE: Multidetector CT imaging of the abdomen and pelvis was performed following the standard protocol without IV  contrast. COMPARISON:  None. FINDINGS: Lower chest: No acute abnormality. Hepatobiliary: No focal liver abnormality is seen. No gallstones, gallbladder wall thickening, or biliary dilatation. Pancreas: Unremarkable. No pancreatic ductal dilatation or surrounding inflammatory changes. Spleen: Normal in size without focal abnormality. Adrenals/Urinary Tract: Adrenal glands are unremarkable. Kidneys are normal, without renal calculi, focal lesion, or hydronephrosis. Bladder is unremarkable. Stomach/Bowel: Stomach is within normal limits. Appendix appears normal. No evidence of bowel wall thickening, distention, or inflammatory changes. Vascular/Lymphatic: No significant vascular findings are present. No enlarged abdominal or pelvic lymph nodes. Reproductive: Uterus and bilateral adnexa are unremarkable. Other: Free fluid in the pelvis is likely physiologic. No free air. Tiny fat containing umbilical hernia is noted. Musculoskeletal: No acute or significant osseous findings. IMPRESSION: 1. Free fluid in the pelvis is likely physiologic. A pelvic ultrasound could better evaluate the uterus and adnexae if there is clinical concern. 2. No other acute abnormalities identified. Electronically Signed   By: Gerome Sam III M.D   On: 01/30/2017 19:21    Procedures Procedures (including critical care time)  Medications  Ordered in ED Medications  sodium chloride 0.9 % bolus 1,000 mL (0 mLs Intravenous Stopped 01/30/17 1835)  acetaminophen (TYLENOL) tablet 650 mg (650 mg Oral Given 01/30/17 1653)  cefTRIAXone (ROCEPHIN) 2 g in dextrose 5 % 50 mL IVPB (0 g Intravenous Stopped 01/30/17 1915)     Initial Impression / Assessment and Plan / ED Course  I have reviewed the triage vital signs and the nursing notes.  Pertinent labs & imaging results that were available during my care of the patient were reviewed by me and considered in my medical decision making (see chart for details).  Clinical Course as of Jan 31 2104  Sun Jan 30, 2017  1645 LFTs nad bili slightly elevated. CT renal stone pending.  Total Bilirubin: (!) 1.9 [AN]  1833 Nitrite: (!) POSITIVE [AN]  1833 UA has several markers consistent with infection. Patient has no pain with urination, blood in the urine, or frequent urination. Pt also denies any vaginal discharge or bleeding. She also denies any risk factors for STD and has declined a pelvic exam. Pt also has refused both CT and LP. She reports that she has had bladder infection in the past with back pain - but never had fevers - so this could be still pyelonephritis.   Patient wants to decline a LP against medical advice. Patient understands that her actions will lead to inadequate medical workup, and that she is at risk of complications of missed diagnosis, which includes morbidity and mortality.  Opportunity to change mind given. Discussion witnessed by sister. Sister lives near by, and the alternate that pt wants is wait and watch approach. She will come in to the Er if the symptoms get worse. Patient is demonstrating good capacity to make decision. Patient understands that she needs to return to the ER immediately if her symptoms get worse.  Pt agreed to CT abd.  Nitrite: (!) POSITIVE [AN]    Clinical Course User Index [AN] Derwood Kaplan, MD    DDX includes: Primary headaches -  including migrainous headaches, cluster headaches, tension headaches. ICH Cavernous sinus thrombosis . Dural venous thrombosis Meningitis Encephalitis Sinusitis Tumor Pyelonephritis PID  A/P: Pt comes in with cc of headaches. Headache is severe, and there is associated nausea, dizziness and fevers, so  Concerns there for meningitis / venous thrombosis.  Headache also possible due to underlying viral infection. Pt works at day care. She doesn't demonstrate any  uri like symptoms.  We will get CT head and abdomen. Scleras is icteric and pt does have bilateral flank pain, R worse than L. [place] can be ordered if needed later on, but the exam not consistent with hepatobiliary process.  Final Clinical Impressions(s) / ED Diagnoses   Final diagnoses:  Pyelonephritis  Bad headache  Dizziness    New Prescriptions New Prescriptions   CEPHALEXIN (KEFLEX) 500 MG CAPSULE    Take 1 capsule (500 mg total) by mouth 2 (two) times daily.   PROMETHAZINE (PHENERGAN) 25 MG TABLET    Take 1 tablet (25 mg total) by mouth every 6 (six) hours as needed for nausea.     Derwood Kaplan, MD 01/30/17 1645    Derwood Kaplan, MD 01/30/17 2106

## 2017-01-30 NOTE — Discharge Instructions (Signed)
We suspect that the cause for your back pain, nausea, weakness and fevers is kidney infection. We are not 100% sure what the cause of your headache and dizziness is - you had declined lumbar puncture and pelvic exam.  We recommend that you take the medicine prescribed but return to the ER symptoms get worse.  Please return to the ER if the headache gets severe and in not improving, you have associated new one sided numbness, tingling, weakness or confusion, seizures, poor balance or poor vision.  Please return to the ER if your symptoms worsen; you have increased pain, fevers, chills, inability to keep any medications down, confusion.

## 2017-01-30 NOTE — ED Notes (Signed)
Patient transported to CT 

## 2017-02-01 LAB — URINE CULTURE: Culture: >=100000 — AB

## 2017-02-02 ENCOUNTER — Telehealth: Payer: Self-pay | Admitting: Emergency Medicine

## 2017-02-02 NOTE — Telephone Encounter (Signed)
Post ED Visit - Positive Culture Follow-up  Culture report reviewed by antimicrobial stewardship pharmacist:  []  Enzo BiNathan Batchelder, Pharm.D. []  Celedonio MiyamotoJeremy Frens, Pharm.D., BCPS AQ-ID []  Garvin FilaMike Maccia, Pharm.D., BCPS []  Georgina PillionElizabeth Martin, [address]Ph[address]D., BCPS []  NutriosoMinh Pham, [address]Ph[address]D., BCPS, AAHIVP []  Estella HuskMichelle Turner, Pharm.D., BCPS, AAHIVP []  Lysle Pearlachel Rumbarger, PharmD, BCPS []  Casilda Carlsaylor Stone, PharmD, BCPS []  Pollyann SamplesAndy Johnston, PharmD, BCPS Sharin MonsEmily Sinclair PharmD  Positive urine culture Treated with cephalexin, organism sensitive to the same and no further patient follow-up is required at this time.  Berle MullMiller, Jamara Vary 02/02/2017, 10:53 AM

## 2017-09-12 ENCOUNTER — Encounter: Payer: Self-pay | Admitting: *Deleted

## 2020-10-30 ENCOUNTER — Encounter (HOSPITAL_COMMUNITY): Payer: Self-pay

## 2020-10-30 ENCOUNTER — Other Ambulatory Visit: Payer: Self-pay

## 2020-10-30 ENCOUNTER — Emergency Department (HOSPITAL_COMMUNITY)
Admission: EM | Admit: 2020-10-30 | Discharge: 2020-10-30 | Disposition: A | Discharge status: Home / Self Care | Payer: Self-pay | Attending: Emergency Medicine | Admitting: Emergency Medicine

## 2020-10-30 DIAGNOSIS — H1032 Unspecified acute conjunctivitis, left eye: Secondary | ICD-10-CM | POA: Insufficient documentation

## 2020-10-30 DIAGNOSIS — K1370 Unspecified lesions of oral mucosa: Secondary | ICD-10-CM | POA: Insufficient documentation

## 2020-10-30 LAB — HIV ANTIBODY (ROUTINE TESTING W REFLEX): HIV Screen 4th Generation wRfx: NONREACTIVE

## 2020-10-30 LAB — URINALYSIS, ROUTINE W REFLEX MICROSCOPIC
Bilirubin Urine: NEGATIVE
Glucose, UA: NEGATIVE mg/dL
Ketones, ur: 5 mg/dL — AB
Leukocytes,Ua: NEGATIVE
Nitrite: NEGATIVE
Protein, ur: NEGATIVE mg/dL
Specific Gravity, Urine: 1.026 (ref 1.005–1.030)
pH: 6 (ref 5.0–8.0)

## 2020-10-30 LAB — PREGNANCY, URINE: Preg Test, Ur: NEGATIVE

## 2020-10-30 MED ORDER — ERYTHROMYCIN 5 MG/GM OP OINT: TOPICAL_OINTMENT | OPHTHALMIC | 0 refills | Status: AC

## 2020-10-30 MED ORDER — TETRACAINE HCL 0.5 % OP SOLN
2.0000 [drp] | Freq: Once | OPHTHALMIC | Status: AC
  Administered 2020-10-30: 2 [drp] via OPHTHALMIC
  Filled 2020-10-30: qty 4

## 2020-10-30 MED ORDER — FLUORESCEIN SODIUM 1 MG OP STRP
1.0000 | ORAL_STRIP | Freq: Once | OPHTHALMIC | Status: AC
  Administered 2020-10-30: 1 via OPHTHALMIC
  Filled 2020-10-30: qty 1

## 2020-10-30 NOTE — ED Provider Notes (Signed)
[facility]-EMERGENCY DEPT Provider Note   CSN: [id number] Arrival date & time: 10/30/20  1753     History Chief Complaint  Patient presents with  . Left Eye Pain    Miranda Bowen is a 35 y.o. female with PMH/o heartburn who presents for evaluation of left eye irritation x 1 week. She states that the left eye has been red and has noted some watery drainage/discharge. She has also noted that she will see some crusting ot the eye in the AM. 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 She has been using OTC eyedrops for redness with no improvement. She states she can see and denies any blurry vision, double visoin. No preceding trauma, injury or fall. She does not wear glasses or contacts. She has not noted any fevers.   She also throat irritation x 1 month. She has noted some sore areas to her hard palate and throat. She has not noted any difficulty tolerating secretions or PO. She has not had any difficulty swallowing. She denies any SOB, vomiting.    The history is provided by the patient.       Past Medical History:  Diagnosis Date  . Heartburn during pregnancy   . Medical history non-contributory   . No pertinent past medical history     There are no problems to display for this patient.   Past Surgical History:  Procedure Laterality Date  . CESAREAN SECTION N/A 03/26/2014   Procedure: CESAREAN SECTION;  Surgeon: Allie Bossier, MD;  Location: [facility];  Service: Obstetrics;  Laterality: N/A;  . CESAREAN SECTION    . CESAREAN SECTION N/A 09/24/2015   Procedure: CESAREAN SECTION;  Surgeon: Catalina Antigua, MD;  Location: [facility];  Service: Obstetrics;  Laterality: N/A;     OB History    Gravida  3   Para  3   Term  3   Preterm  0   AB  0   Living  3     SAB  0   IAB  0   Ectopic  0   Multiple  0   Live Births  3           Family History  Problem Relation Age of Onset  . Hypertension  Mother     Social History   Tobacco Use  . Smoking status: Never Smoker  . Smokeless tobacco: Never Used  Substance Use Topics  . Alcohol use: No  . Drug use: No    Home Medications Prior to Admission medications   Medication Sig Start Date End Date Taking? Authorizing Provider  erythromycin ophthalmic ointment Place a 1/2 inch ribbon of ointment into the lower eyelid. 10/30/20  Yes Maxwell Caul, PA-C  acetaminophen (TYLENOL) 325 MG tablet Take 325-650 mg by mouth every 6 (six) hours as needed (for pain or headaches).    [provider]  cephALEXin (KEFLEX) 500 MG capsule Take 1 capsule (500 mg total) by mouth 2 (two) times daily. 01/30/17   Derwood Kaplan, MD  cyclobenzaprine (FLEXERIL) 5 MG tablet Take 1 tablet (5 mg total) by mouth 3 (three) times daily as needed for muscle spasms. Patient not taking: Reported on 01/30/2017 06/11/16   Janne Napoleon, NP  ibuprofen (ADVIL,MOTRIN) 600 MG tablet Take 1 tablet (600 mg total) by mouth every 6 (six) hours as needed. 01/30/17   Derwood Kaplan, MD  Prenatal Multivit-Min-Fe-FA (PRENATAL VITAMINS) 0.8 MG tablet  Take 1 tablet by mouth daily. Patient not taking: Reported on 11/05/2015 02/07/15   Amedeo Gory, CNM  promethazine (PHENERGAN) 25 MG tablet Take 1 tablet (25 mg total) by mouth every 6 (six) hours as needed for nausea. 01/30/17   Derwood Kaplan, MD    Allergies    Patient has no known allergies.  Review of Systems   Review of Systems  Constitutional: Negative for fever.  HENT: Negative for trouble swallowing and voice change.   Eyes: Positive for pain, discharge, redness and itching. Negative for photophobia.  Respiratory: Negative for shortness of breath.   Gastrointestinal: Negative for vomiting.  All other systems reviewed and are negative.   Physical Exam Updated Vital Signs BP 115/78   Pulse 89   Temp 99.7 F (37.6 C) (Oral)   Resp 18   Ht 5\' 7"  (1.702 m)   Wt 67.8 kg   SpO2 98%   BMI 23.40  kg/m   Physical Exam Vitals and nursing note reviewed.  Constitutional:      Appearance: She is well-developed.  HENT:     Head: Normocephalic and atraumatic.     Mouth/Throat:     Comments: Lesions noted to the hard palate, posterior oropharynx.  Uvula is midline.  Airways patent, phonation is intact.  No posterior oropharynx erythema, edema.  No oral angioedema. Eyes:     General: No scleral icterus.       Right eye: No discharge.        Left eye: No discharge.     Conjunctiva/sclera:     Left eye: Left conjunctiva is injected.     Comments: PERRL. EOMs intact. No nystagmus. No neglect.  Left conjunctival injection.  No active drainage.  No consensual pain.  No hyphema, hypopyon.  Pulmonary:     Effort: Pulmonary effort is normal.  Skin:    General: Skin is warm and dry.  Neurological:     Mental Status: She is alert.  Psychiatric:        Speech: Speech normal.        Behavior: Behavior normal.     ED Results / Procedures / Treatments   Labs (all labs ordered are listed, but only abnormal results are displayed) Labs Reviewed  PREGNANCY, URINE  URINALYSIS, ROUTINE W REFLEX MICROSCOPIC  RPR  HIV ANTIBODY (ROUTINE TESTING W REFLEX)  GC/CHLAMYDIA PROBE AMP ([place]) NOT AT [facility]  GC/CHLAMYDIA PROBE AMP ([place]) NOT AT [facility]    EKG None  Radiology No results found.  Procedures Procedures   Medications Ordered in ED Medications  tetracaine (PONTOCAINE) 0.5 % ophthalmic solution 2 drop (2 drops Both Eyes Given 10/30/20 1834)  fluorescein ophthalmic strip 1 strip (1 strip Both Eyes Given 10/30/20 1834)    ED Course  I have reviewed the triage vital signs and the nursing notes.  Pertinent labs & imaging results that were available during my care of the patient were reviewed by me and considered in my medical decision making (see chart for details).    MDM Rules/Calculators/A&P                          35 year old female who presents for evaluation of  left eye redness, irritation that has been ongoing for last week.  Reports some watery drainage, crusting discharge in the morning.  No trauma.  Does not wear glasses or contacts.  No fevers.  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  On initial arrival, she is afebrile nontoxic-appearing.  Vital signs are stable.  On exam, she has left conjunctival injection.  No active drainage.  No consensual pain noted.  Pupils reactive.  Consider conjunctivitis versus allergies.  History/physical exam not concerning for preseptal versus septal cellulitis.  Patient also reports that for the last month, she has noted lesions noted to her hard palate in her throat.  She states that she has not sought evaluation for this.  Denies any difficulty swallowing, vomiting, difficulty breathing.  On my exam, she has lesions noted to the hard palate and into the posterior oropharynx.  No evidence of rash on tongue that would be concerning for thrush.  Her airway is patent, her phonation is intact.  Plan for evaluation with Joseph Art lamp, IOP's.  Wood's lamp shows no evidence of fluorescein uptake, corneal abrasion.   Intraocular pressure as documented below:  Left IOP: 11, 12 Right IOP: 10, 12  Patient with visual acuity of 20/50 bilaterally.  Please see separate nurse note.  I discussed with patient regarding her work-up here today in the emergency department.  At this time, I suspect her eye irritation is likely due to bacterial versus viral conjunctivitis.  There is a possibility of it being bacterial given that this has been ongoing at her daycare.  We will plan to treat and have her follow-up with ophthalmology as needed.  Regarding her throat.  She does have lesions noted to the hard palate as well as the posterior oropharynx.  I am unclear exactly what is etiology of this.  She denies any recent oral sex.  She is able to tolerate her secretions, tolerate p.o., has no difficulty  swallowing, difficulty breathing, vomiting.  At this time, no indication for further emergent intervention at this time.  I did discuss with patient what that this looks abnormal and that she would need to follow-up with outpatient ENT.  She is requesting STD testing here in the emergency department.  We will plan for gonorrhea/committee as well as gonorrhea of the throat, HIV, syphilis.  Patient understands that these results will not come back today.  Portions of this note were generated with Scientist, clinical (histocompatibility and immunogenetics). Dictation errors may occur despite best attempts at proofreading.   Final Clinical Impression(s) / ED Diagnoses Final diagnoses:  Acute conjunctivitis of left eye, unspecified acute conjunctivitis type  Unspecified lesions of oral mucosa    Rx / DC Orders ED Discharge Orders         Ordered    erythromycin ophthalmic ointment        10/30/20 1953           Rosana Hoes 10/30/20 2021    Milagros Loll, MD 10/30/20 2226

## 2020-10-30 NOTE — ED Notes (Signed)
An After Visit Summary was printed and given to the patient. Discharge instructions given and no further questions at this time.  

## 2020-10-30 NOTE — ED Notes (Signed)
Vision acuity screening completed 20/50 bilaterally.

## 2020-10-30 NOTE — Discharge Instructions (Signed)
Use antibiotic as directed.  Follow-up with referred ophthalmologist if you have no improvement in her symptoms.  As we discussed, you need to follow-up with the ear nose and throat doctor regarding the lesions noted on your mouth.  Please call their office and arrange for an appointment.  You have been tested today for HIV, syphilis, gonorrhea/chlamydia.  These results will take about 2 to 4 days to come back.  If you are positive, they will notify you.  If you are negative, they may not let you know.  He can always check online or on the MyChart app regarding your results.  Return to the emergency department for any vision loss, difficulty breathing, difficulty swallowing, vomiting or any other worsening concerning symptoms.

## 2020-10-30 NOTE — ED Triage Notes (Signed)
Pt c/o left eye pain x1 week. Pt states its red, slightly itchy, with dry crusty residue around eye in the morning. 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

## 2020-10-31 LAB — GC/CHLAMYDIA PROBE AMP (~~LOC~~) NOT AT ARMC
Chlamydia: NEGATIVE
Chlamydia: NEGATIVE
Comment: NEGATIVE
Comment: NEGATIVE
Comment: NORMAL
Comment: NORMAL
Neisseria Gonorrhea: NEGATIVE
Neisseria Gonorrhea: NEGATIVE

## 2020-10-31 LAB — RPR
RPR Ser Ql: REACTIVE — AB
RPR Titer: 1:128 {titer}

## 2020-11-03 LAB — T.PALLIDUM AB, TOTAL: T Pallidum Abs: REACTIVE — AB

## 2020-11-12 ENCOUNTER — Emergency Department (HOSPITAL_COMMUNITY)
Admission: EM | Admit: 2020-11-12 | Discharge: 2020-11-12 | Disposition: A | Discharge status: Home / Self Care | Payer: Self-pay | Attending: Emergency Medicine | Admitting: Emergency Medicine

## 2020-11-12 ENCOUNTER — Encounter (HOSPITAL_COMMUNITY): Payer: Self-pay

## 2020-11-12 ENCOUNTER — Other Ambulatory Visit: Payer: Self-pay

## 2020-11-12 DIAGNOSIS — R519 Headache, unspecified: Secondary | ICD-10-CM | POA: Insufficient documentation

## 2020-11-12 DIAGNOSIS — H2000 Unspecified acute and subacute iridocyclitis: Secondary | ICD-10-CM

## 2020-11-12 DIAGNOSIS — H20012 Primary iridocyclitis, left eye: Secondary | ICD-10-CM | POA: Insufficient documentation

## 2020-11-12 DIAGNOSIS — H53149 Visual discomfort, unspecified: Secondary | ICD-10-CM | POA: Insufficient documentation

## 2020-11-12 MED ORDER — PREDNISOLONE ACETATE 1 % OP SUSP
2.0000 [drp] | Freq: Once | OPHTHALMIC | Status: AC
  Administered 2020-11-12: 2 [drp] via OPHTHALMIC
  Filled 2020-11-12: qty 5

## 2020-11-12 MED ORDER — TETRACAINE HCL 0.5 % OP SOLN
2.0000 [drp] | Freq: Once | OPHTHALMIC | Status: AC
  Administered 2020-11-12: 2 [drp] via OPHTHALMIC
  Filled 2020-11-12: qty 4

## 2020-11-12 MED ORDER — HOMATROPINE HBR 5 % OP SOLN
1.0000 [drp] | Freq: Once | OPHTHALMIC | Status: DC
Start: 2020-11-12 — End: 2020-11-12
  Filled 2020-11-12: qty 5

## 2020-11-12 MED ORDER — FLUORESCEIN SODIUM 1 MG OP STRP
1.0000 | ORAL_STRIP | Freq: Once | OPHTHALMIC | Status: AC
  Administered 2020-11-12: 1 via OPHTHALMIC
  Filled 2020-11-12: qty 1

## 2020-11-12 MED ORDER — CYCLOPENTOLATE HCL 1 % OP SOLN
2.0000 [drp] | Freq: Once | OPHTHALMIC | Status: AC
  Administered 2020-11-12: 2 [drp] via OPHTHALMIC
  Filled 2020-11-12: qty 2

## 2020-11-12 NOTE — ED Provider Notes (Signed)
[facility]-EMERGENCY DEPT Provider Note   CSN: [id number] Arrival date & time: 11/12/20  1334     History Chief Complaint  Patient presents with  . Eye Pain    KEYSI OELKERS is a 35 y.o. female with recent diagnosis of syphilis, treated yesterday, presenting for evaluation of worsening left eye pain.  She states that she woke up this morning with left eye pain, persisting redness, and new photophobia.  She did have associated headache.  She states her vision may be slightly blurry.  She was evaluated in the ED on 10/30/2020 for left eye redness and was diagnosed with a likely conjunctivitis, at that time she had absence of photophobia.  She also had oral lesions and was tested for various STDs.  Her RPR and Tpalladium antibodies were reactive.  She states she received her penicillin injection yesterday.  States the oral lesions resolved.  She has no other symptoms currently.  She does not wear contact lenses.  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  Her eye is tearing, however no drainage.  The history is provided by the patient and medical records.       Past Medical History:  Diagnosis Date  . Heartburn during pregnancy   . Medical history non-contributory   . No pertinent past medical history     There are no problems to display for this patient.   Past Surgical History:  Procedure Laterality Date  . CESAREAN SECTION N/A 03/26/2014   Procedure: CESAREAN SECTION;  Surgeon: Allie Bossier, MD;  Location: [facility];  Service: Obstetrics;  Laterality: N/A;  . CESAREAN SECTION    . CESAREAN SECTION N/A 09/24/2015   Procedure: CESAREAN SECTION;  Surgeon: Catalina Antigua, MD;  Location: [facility];  Service: Obstetrics;  Laterality: N/A;     OB History    Gravida  3   Para  3   Term  3   Preterm  0   AB  0   Living  3     SAB  0   IAB  0   Ectopic  0   Multiple  0   Live Births  3           Family History  Problem  Relation Age of Onset  . Hypertension Mother     Social History   Tobacco Use  . Smoking status: Never Smoker  . Smokeless tobacco: Never Used  Substance Use Topics  . Alcohol use: No  . Drug use: No    Home Medications Prior to Admission medications   Medication Sig Start Date End Date Taking? Authorizing Provider  acetaminophen (TYLENOL) 325 MG tablet Take 325-650 mg by mouth every 6 (six) hours as needed (for pain or headaches).    [provider]  cephALEXin (KEFLEX) 500 MG capsule Take 1 capsule (500 mg total) by mouth 2 (two) times daily. 01/30/17   Derwood Kaplan, MD  cyclobenzaprine (FLEXERIL) 5 MG tablet Take 1 tablet (5 mg total) by mouth 3 (three) times daily as needed for muscle spasms. Patient not taking: Reported on 01/30/2017 06/11/16   Janne Napoleon, NP  erythromycin ophthalmic ointment Place a 1/2 inch ribbon of ointment into the lower eyelid. 10/30/20   Maxwell Caul, PA-C  ibuprofen (ADVIL,MOTRIN) 600 MG tablet Take 1 tablet (600 mg total) by mouth every 6 (six) hours as needed. 01/30/17   Derwood Kaplan, MD  Prenatal Multivit-Min-Fe-FA (PRENATAL VITAMINS) 0.8 MG tablet  Take 1 tablet by mouth daily. Patient not taking: Reported on 11/05/2015 02/07/15   Amedeo Gory, CNM  promethazine (PHENERGAN) 25 MG tablet Take 1 tablet (25 mg total) by mouth every 6 (six) hours as needed for nausea. 01/30/17   Derwood Kaplan, MD    Allergies    Patient has no known allergies.  Review of Systems   Review of Systems  Eyes: Positive for photophobia, pain and redness.  All other systems reviewed and are negative.   Physical Exam Updated Vital Signs BP 137/88   Pulse 85   Temp 98.8 F (37.1 C) (Oral)   Resp 18   Ht 5\' 7"  (1.702 m)   Wt 67.1 kg   SpO2 100%   BMI 23.18 kg/m   Physical Exam Vitals and nursing note reviewed.  Constitutional:      Appearance: She is well-developed.  HENT:     Head: Normocephalic and atraumatic.  Eyes:      Extraocular Movements: Extraocular movements intact.     Comments: Left eye conjunctival injection is noted, this does not spare the limbus.  Cornea does not appear steamy.  Pupil is equal round and reactive to light.  There is consensual photophobia on the left. Left eye was visualized under Woods lamp with fluorescein stain, no uptake noted. IOP's are equal and normal, 16 right, 15 left No periorbital edema or erythema.  Cardiovascular:     Rate and Rhythm: Normal rate.  Pulmonary:     Effort: Pulmonary effort is normal.  Abdominal:     Palpations: Abdomen is soft.  Skin:    General: Skin is warm.  Neurological:     Mental Status: She is alert.  Psychiatric:        Behavior: Behavior normal.     ED Results / Procedures / Treatments   Labs (all labs ordered are listed, but only abnormal results are displayed) Labs Reviewed - No data to display  EKG None  Radiology No results found.  Procedures Procedures   Medications Ordered in ED Medications  tetracaine (PONTOCAINE) 0.5 % ophthalmic solution 2 drop (2 drops Both Eyes Given 11/12/20 1734)  fluorescein ophthalmic strip 1 strip (1 strip Left Eye Given 11/12/20 1734)  prednisoLONE acetate (PRED FORTE) 1 % ophthalmic suspension 2 drop (2 drops Left Eye Given 11/12/20 1806)  cyclopentolate (CYCLODRYL,CYCLOGYL) 1 % ophthalmic solution 2 drop (2 drops Left Eye Given 11/12/20 1807)    ED Course  I have reviewed the triage vital signs and the nursing notes.  Pertinent labs & imaging results that were available during my care of the patient were reviewed by me and considered in my medical decision making (see chart for details).  Clinical Course as of 11/12/20 1911  Wed Nov 12, 2020  1737 Consulted with Dr. Nov 14, 2020 with ophthalmology.  Recommends topical prednisolone acetate 4 times daily.  Patient can see him in office tomorrow for urgent evaluation of the degree of her iritis.  Can administer dose of cycloplegic in the ED for symptom  relief if necessary.  Recommends Scoplomamine versus homatropine versus cyclopentolate. Prednisolone acetate QID, see him in clinic tomorrow [JR]    Clinical Course User Index [JR] Roben Tatsch, Randon Goldsmith N, PA-C   MDM Rules/Calculators/A&P                          Examination is consistent with iritis.  Considering recent syphilis diagnosis, she was treated yesterday for this.  No other symptoms currently  with review of systems.  She is treated with topical steroid drops and a dose of cycloplegics here in the ED with gradual improvement in symptoms.  Dr. Randon Goldsmith with ophthalmology will see patient tomorrow for urgent reevaluation and further management.  Discussed with attending physician Dr. Freida Busman, does not recommend any further work-up at this time. Patient is stable for discharge  Discussed results, findings, treatment and follow up. Patient advised of return precautions. Patient verbalized understanding and agreed with plan.  Final Clinical Impression(s) / ED Diagnoses Final diagnoses:  Acute iritis, left eye    Rx / DC Orders ED Discharge Orders    None       Weylin Plagge, [place] N, PA-C 11/13/20 1610    Lorre Nick, MD 11/17/20 1336

## 2020-11-12 NOTE — ED Triage Notes (Signed)
Pt arrived via walk in, c/o left eye pain. States very sensitive to light. Denies any itching or crusting to eye.

## 2020-11-12 NOTE — ED Provider Notes (Signed)
Emergency Medicine Provider Triage Evaluation Note  Miranda Bowen , a 35 y.o. female  was evaluated in triage.  Pt complains of left eye redness and pain.  Symptoms started this morning when she woke up.  Eye is extremely sensitive to light.  Has not had any drainage.  Reports some blurred vision.  Was seen for conjunctivitis on 4/21 and treated, also tested positive for syphilis after this visit.  Review of Systems  Positive: Eye pain, redness, light sensitivity,  Negative: Fever, rash, eye discharge  Physical Exam  BP 109/82 (BP Location: Right Arm)   Pulse 77   Temp 99.6 F (37.6 C) (Oral)   Resp 15   Ht 5\' 7"  (1.702 m)   Wt 67.1 kg   SpO2 98%   BMI 23.18 kg/m  Gen:   Awake, no distress  Resp:  Normal effort  MSK:   Moves extremities without difficulty  Other:  Left eye red, very sensitive to light with consensual pain, no drainage noted  Medical Decision Making  Medically screening exam initiated at 2:18 PM.  Appropriate orders placed.  was informed that the remainder of the evaluation will be completed by another provider, this initial triage assessment does not replace that evaluation, and the importance of remaining in the ED until their evaluation is complete.  Will be moved the back for full evaluation as soon as room is available   Miranda Bowen 11/12/20 1426    01/12/21, MD 11/13/20 1155

## 2020-11-12 NOTE — Discharge Instructions (Addendum)
Please go to the eye doctor's office at either 8:15-8:30am or 1:15-1:30am tomorrow for urgent evaluation and further management of your eye. Apply 1 drop of steroid to your left eye four times daily.

## 2021-09-30 ENCOUNTER — Ambulatory Visit (HOSPITAL_COMMUNITY)
Admission: EM | Admit: 2021-09-30 | Discharge: 2021-09-30 | Disposition: A | Discharge status: Home / Self Care | Payer: Self-pay | Attending: Family Medicine | Admitting: Family Medicine

## 2021-09-30 ENCOUNTER — Encounter (HOSPITAL_COMMUNITY): Payer: Self-pay

## 2021-09-30 DIAGNOSIS — K1321 Leukoplakia of oral mucosa, including tongue: Secondary | ICD-10-CM

## 2021-09-30 DIAGNOSIS — H66001 Acute suppurative otitis media without spontaneous rupture of ear drum, right ear: Secondary | ICD-10-CM

## 2021-09-30 DIAGNOSIS — H9201 Otalgia, right ear: Secondary | ICD-10-CM

## 2021-09-30 MED ORDER — AMOXICILLIN 875 MG PO TABS: 875.0000 mg | ORAL_TABLET | Freq: Two times a day (BID) | ORAL | 0 refills | Status: AC

## 2021-09-30 NOTE — ED Provider Notes (Signed)
?[facility] ? ? ? ?CSN: [id number] ?Arrival date & time: 09/30/21  1727 ? ? ?  ? ?History   ?Chief Complaint ?Chief Complaint  ?Patient presents with  ? Otalgia  ? ? ?HPI ?Miranda Bowen is a 36 y.o. female.  ? ?HPI ?Patient complains of right ear pain and white coating on her tongue following drinking a detox tea.  Her tongue is nonpainful but she was concerned as the white coating has persisted.  Patient denies any known history of recurrent ear infections.  She endorses some diminished hearing since ear pain started.  She denies any other associated URI symptoms. ?Past Medical History:  ?Diagnosis Date  ? Heartburn during pregnancy   ? Medical history non-contributory   ? No pertinent past medical history   ? ? ?There are no problems to display for this patient. ? ? ?Past Surgical History:  ?Procedure Laterality Date  ? CESAREAN SECTION N/A 03/26/2014  ? Procedure: CESAREAN SECTION;  Surgeon: Allie Bossier, MD;  Location: [facility];  Service: Obstetrics;  Laterality: N/A;  ? CESAREAN SECTION    ? CESAREAN SECTION N/A 09/24/2015  ? Procedure: CESAREAN SECTION;  Surgeon: Catalina Antigua, MD;  Location: [facility];  Service: Obstetrics;  Laterality: N/A;  ? ? ?OB History   ? ? Gravida  ?3  ? Para  ?3  ? Term  ?3  ? Preterm  ?0  ? AB  ?0  ? Living  ?3  ?  ? ? SAB  ?0  ? IAB  ?0  ? Ectopic  ?0  ? Multiple  ?0  ? Live Births  ?3  ?   ?  ?  ? ? ? ?Home Medications   ? ?Prior to Admission medications   ?Medication Sig Start Date End Date Taking? Authorizing Provider  ?amoxicillin (AMOXIL) 875 MG tablet Take 1 tablet (875 mg total) by mouth 2 (two) times daily for 7 days. 09/30/21 10/07/21 Yes Bing Neighbors, FNP  ?acetaminophen (TYLENOL) 325 MG tablet Take 325-650 mg by mouth every 6 (six) hours as needed (for pain or headaches).    [provider]  ?cyclobenzaprine (FLEXERIL) 5 MG tablet Take 1 tablet (5 mg total) by mouth 3 (three) times daily as needed for muscle spasms. ?Patient not taking: Reported on  01/30/2017 06/11/16   Janne Napoleon, NP  ?erythromycin ophthalmic ointment Place a 1/2 inch ribbon of ointment into the lower eyelid. 10/30/20   Maxwell Caul, PA-C  ?ibuprofen (ADVIL,MOTRIN) 600 MG tablet Take 1 tablet (600 mg total) by mouth every 6 (six) hours as needed. 01/30/17   Derwood Kaplan, MD  ?Prenatal Multivit-Min-Fe-FA (PRENATAL VITAMINS) 0.8 MG tablet Take 1 tablet by mouth daily. ?Patient not taking: Reported on 11/05/2015 02/07/15   Amedeo Gory, CNM  ?promethazine (PHENERGAN) 25 MG tablet Take 1 tablet (25 mg total) by mouth every 6 (six) hours as needed for nausea. 01/30/17   Derwood Kaplan, MD  ? ? ?Family History ?Family History  ?Problem Relation Age of Onset  ? Hypertension Mother   ? ? ?Social History ?Social History  ? ?Tobacco Use  ? Smoking status: Never  ? Smokeless tobacco: Never  ?Substance Use Topics  ? Alcohol use: No  ? Drug use: No  ? ? ? ?Allergies   ?Patient has no known allergies. ? ? ?Review of Systems ?Review of Systems ?Pertinent negatives listed in HPI  ?Physical Exam ?Triage Vital Signs ?ED Triage Vitals  ?Enc Vitals Group  ?  BP 09/30/21 1839 128/83  ?   Pulse Rate 09/30/21 1839 83  ?   Resp 09/30/21 1839 18  ?   Temp 09/30/21 1839 99.2 ?F (37.3 ?C)  ?   Temp Source 09/30/21 1839 Oral  ?   SpO2 09/30/21 1839 97 %  ?   Weight --   ?   Height --   ?   Head Circumference --   ?   Peak Flow --   ?   Pain Score 09/30/21 1840 10  ?   Pain Loc --   ?   Pain Edu? --   ?   Excl. in GC? --   ? ?No data found. ? ?Updated Vital Signs ?BP 128/83 (BP Location: Right Arm)   Pulse 83   Temp 99.2 ?F (37.3 ?C) (Oral)   Resp 18   LMP 09/16/2021   SpO2 97%  ? ?Visual Acuity ?Right Eye Distance:   ?Left Eye Distance:   ?Bilateral Distance:   ? ?Right Eye Near:   ?Left Eye Near:    ?Bilateral Near:    ? ?Physical Exam ?Constitutional:   ?   Appearance: Normal appearance.  ?HENT:  ?   Mouth/Throat:  ?   Tongue: No lesions.  ?   Pharynx: Oropharynx is clear.  ? ?Cardiovascular:  ?    Rate and Rhythm: Normal rate and regular rhythm.  ?Pulmonary:  ?   Effort: Pulmonary effort is normal.  ?   Breath sounds: Normal breath sounds.  ?Skin: ?   Capillary Refill: Capillary refill takes less than 2 seconds.  ?Neurological:  ?   Mental Status: She is alert.  ?Psychiatric:     ?   Attention and Perception: Attention normal.     ?   Mood and Affect: Mood normal.     ?   Speech: Speech normal.  ? ?UC Treatments / Results  ?Labs ?(all labs ordered are listed, but only abnormal results are displayed) ?Labs Reviewed - No data to display ? ?EKG ? ? ?Radiology ?No results found. ? ?Procedures ?Procedures (including critical care time) ? ?Medications Ordered in UC ?Medications - No data to display ? ?Initial Impression / Assessment and Plan / UC Course  ?I have reviewed the triage vital signs and the nursing notes. ? ?Pertinent labs & imaging results that were available during my care of the patient were reviewed by me and considered in my medical decision making (see chart for details). ? ?  ?Non-recurrent acute otitis media  ?Amoxicillin 875 mg BID x 7 days ?Tongue benign finding. ?RTC PRN ?Final Clinical Impressions(s) / UC Diagnoses  ? ?Final diagnoses:  ?Non-recurrent acute suppurative otitis media of right ear without spontaneous rupture of tympanic membrane  ? ?Discharge Instructions   ?None ?  ? ?ED Prescriptions   ? ? Medication Sig Dispense Auth. Provider  ? amoxicillin (AMOXIL) 875 MG tablet Take 1 tablet (875 mg total) by mouth 2 (two) times daily for 7 days. 14 tablet Bing Neighbors, FNP  ? ?  ? ?PDMP not reviewed this encounter. ?  ?Bing Neighbors, FNP ?10/05/21 2008 ? ?

## 2021-09-30 NOTE — ED Triage Notes (Signed)
Pt presents today with left ear pain that started a week ago. Pt took Tylenol last night.Pt describes pain as a sharp jabbing pain.  Pt states color of her tongue is a whitish color. ?

## 2022-06-01 ENCOUNTER — Ambulatory Visit (HOSPITAL_COMMUNITY)
Admission: EM | Admit: 2022-06-01 | Discharge: 2022-06-01 | Disposition: A | Discharge status: Home / Self Care | Payer: Self-pay

## 2022-06-01 ENCOUNTER — Other Ambulatory Visit: Payer: Self-pay

## 2022-06-01 ENCOUNTER — Encounter (HOSPITAL_COMMUNITY): Payer: Self-pay | Admitting: *Deleted

## 2022-06-01 DIAGNOSIS — R6889 Other general symptoms and signs: Secondary | ICD-10-CM | POA: Insufficient documentation

## 2022-06-01 DIAGNOSIS — Z1152 Encounter for screening for COVID-19: Secondary | ICD-10-CM | POA: Insufficient documentation

## 2022-06-01 DIAGNOSIS — J069 Acute upper respiratory infection, unspecified: Secondary | ICD-10-CM | POA: Insufficient documentation

## 2022-06-01 DIAGNOSIS — N926 Irregular menstruation, unspecified: Secondary | ICD-10-CM | POA: Insufficient documentation

## 2022-06-01 DIAGNOSIS — H9202 Otalgia, left ear: Secondary | ICD-10-CM | POA: Insufficient documentation

## 2022-06-01 DIAGNOSIS — Z3202 Encounter for pregnancy test, result negative: Secondary | ICD-10-CM | POA: Insufficient documentation

## 2022-06-01 LAB — RESP PANEL BY RT-PCR (FLU A&B, COVID) ARPGX2
Influenza A by PCR: NEGATIVE
Influenza B by PCR: NEGATIVE
SARS Coronavirus 2 by RT PCR: NEGATIVE

## 2022-06-01 LAB — POC URINE PREG, ED: Preg Test, Ur: NEGATIVE

## 2022-06-01 NOTE — ED Triage Notes (Addendum)
C/O nasal congestion, body aches, runny nose, left ear pain onset yesterday. No known fevers. Has not been taking any measures to help alleviate sxs. Also c/o having irregular menstrual periods over the past 2 months; had period 10/1, then again 10/24, and not since.

## 2022-06-01 NOTE — ED Provider Notes (Signed)
[place]    CSN: [id number] Arrival date & time: 06/01/22  1815      History   Chief Complaint Chief Complaint  Patient presents with   Nasal Congestion   Ear Pain   Irregular Menses    HPI Miranda Bowen is a 36 y.o. female.   Patient presents for 1 day of bodyaches, slight nasal congestion and runny nose, sore throat, headache, left ear pain without drainage, and fatigue.  She denies cough, shortness of breath or chest pain, chest congestion, sneezing, abdominal pain, nausea/vomiting, diarrhea, decreased appetite, loss of taste or smell, or new rash.  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  Has not taken thing for symptoms so far.  Also reports she is late for her period.  She is requesting pregnancy testing today.  Patient has had tubal ligation in the past.     Past Medical History:  Diagnosis Date   Heartburn during pregnancy     There are no problems to display for this patient.   Past Surgical History:  Procedure Laterality Date   CESAREAN SECTION N/A 03/26/2014   Procedure: CESAREAN SECTION;  Surgeon: Emily Filbert, MD;  Location: [facility];  Service: Obstetrics;  Laterality: N/A;   CESAREAN SECTION     CESAREAN SECTION N/A 09/24/2015   Procedure: CESAREAN SECTION;  Surgeon: Mora Bellman, MD;  Location: [facility];  Service: Obstetrics;  Laterality: N/A;   TUBAL LIGATION      OB History     Gravida  3   Para  3   Term  3   Preterm  0   AB  0   Living  3      SAB  0   IAB  0   Ectopic  0   Multiple  0   Live Births  3            Home Medications    Prior to Admission medications   Medication Sig Start Date End Date Taking? Authorizing Provider  Multiple Vitamin (MULTIVITAMIN PO) Take by mouth.   Yes [provider]  acetaminophen (TYLENOL) 325 MG tablet Take 325-650 mg by mouth every 6 (six) hours as needed (for pain or headaches).    [provider]   cyclobenzaprine (FLEXERIL) 5 MG tablet Take 1 tablet (5 mg total) by mouth 3 (three) times daily as needed for muscle spasms. Patient not taking: Reported on 01/30/2017 06/11/16   Ashley Murrain, NP  erythromycin ophthalmic ointment Place a 1/2 inch ribbon of ointment into the lower eyelid. 10/30/20   Volanda Napoleon, PA-C  ibuprofen (ADVIL,MOTRIN) 600 MG tablet Take 1 tablet (600 mg total) by mouth every 6 (six) hours as needed. 01/30/17   Varney Biles, MD  Prenatal Multivit-Min-Fe-FA (PRENATAL VITAMINS) 0.8 MG tablet Take 1 tablet by mouth daily. Patient not taking: Reported on 11/05/2015 02/07/15   Cyndee Brightly, CNM  promethazine (PHENERGAN) 25 MG tablet Take 1 tablet (25 mg total) by mouth every 6 (six) hours as needed for nausea. 01/30/17   Varney Biles, MD    Family History Family History  Problem Relation Age of Onset   Hypertension Mother     Social History Social History   Tobacco Use   Smoking status: Never   Smokeless tobacco: Never  Vaping Use   Vaping Use: Never used  Substance Use Topics   Alcohol use: No   Drug use: No  Allergies   Patient has no known allergies.   Review of Systems Review of Systems Per HPI  Physical Exam Triage Vital Signs ED Triage Vitals [06/01/22 1823]  Enc Vitals Group     BP 132/82     Pulse Rate 93     Resp 16     Temp 98.4 F (36.9 C)     Temp Source Oral     SpO2 99 %     Weight      Height      Head Circumference      Peak Flow      Pain Score 8     Pain Loc      Pain Edu?      Excl. in GC?    No data found.  Updated Vital Signs BP 132/82   Pulse 93   Temp 98.4 F (36.9 C) (Oral)   Resp 16   LMP 05/04/2022 (Exact Date)   SpO2 99%   Breastfeeding No   Visual Acuity Right Eye Distance:   Left Eye Distance:   Bilateral Distance:    Right Eye Near:   Left Eye Near:    Bilateral Near:     Physical Exam Vitals and nursing note reviewed.  Constitutional:      General: She is not in  acute distress.    Appearance: Normal appearance. She is not ill-appearing or toxic-appearing.  HENT:     Head: Normocephalic and atraumatic.     Right Ear: Tympanic membrane, ear canal and external ear normal.     Left Ear: Tympanic membrane, ear canal and external ear normal.     Nose: No congestion or rhinorrhea.     Mouth/Throat:     Mouth: Mucous membranes are moist.     Pharynx: Oropharynx is clear. Posterior oropharyngeal erythema present. No oropharyngeal exudate.     Comments: Cobblestoning of posterior pharynx Eyes:     General: No scleral icterus.    Extraocular Movements: Extraocular movements intact.  Cardiovascular:     Rate and Rhythm: Normal rate and regular rhythm.  Pulmonary:     Effort: Pulmonary effort is normal. No respiratory distress.     Breath sounds: Normal breath sounds. No wheezing, rhonchi or rales.  Musculoskeletal:     Cervical back: Normal range of motion and neck supple.  Lymphadenopathy:     Cervical: No cervical adenopathy.  Skin:    General: Skin is warm and dry.     Coloration: Skin is not jaundiced or pale.     Findings: No erythema or rash.  Neurological:     Mental Status: She is alert and oriented to person, place, and time.  Psychiatric:        Behavior: Behavior is cooperative.      UC Treatments / Results  Labs (all labs ordered are listed, but only abnormal results are displayed) Labs Reviewed  RESP PANEL BY RT-PCR (FLU A&B, COVID) [id number]  POC URINE PREG, ED    EKG   Radiology No results found.  Procedures Procedures (including critical care time)  Medications Ordered in UC Medications - No data to display  Initial Impression / Assessment and Plan / UC Course  I have reviewed the triage vital signs and the nursing notes.  Pertinent labs & imaging results that were available during my care of the patient were reviewed by me and considered in my medical decision making (see chart for details).   Patient is  well-appearing, normotensive, afebrile, not tachycardic,  not tachypneic, oxygenating well on room air.    Otalgia of left ear Suspect possible eustachian tube dysfunction, treat with over-the-counter Flonase or Sudafed as needed as well as Tylenol/ibuprofen  Viral URI Encounter for screening for COVID-19 Flu-like symptoms COVID-19, influenza testing obtained Supportive care discussed ER and return precautions discussed note given for work  Irregular menses Negative pregnancy test Urine pregnancy test negative today Recommend follow-up with OB/GYN if concerned about irregular menses  The patient was given the opportunity to ask questions.  All questions answered to their satisfaction.  The patient is in agreement to this plan.    Final Clinical Impressions(s) / UC Diagnoses   Final diagnoses:  Otalgia of left ear  Viral URI  Encounter for screening for COVID-19  Flu-like symptoms  Irregular menses  Negative pregnancy test     Discharge Instructions      You have a viral upper respiratory infection.  Symptoms should improve over the next week to 10 days.  If you develop chest pain or shortness of breath, go to the emergency room.  We have tested you today for COVID-19 and influenza.  You will see the results in Mychart and we will call you with positive results.    Please stay home and isolate until you are aware of the results.    Some things that can make you feel better are: - Increased rest - Increasing fluid with water/sugar free electrolytes - Acetaminophen and ibuprofen as needed for fever/pain - Salt water gargling, chloraseptic spray and throat lozenges - OTC guaifenesin (Mucinex) 600 mg twice daily - Saline sinus flushes or a neti pot - Humidifying the air - Flonase for ear pain     ED Prescriptions   None    PDMP not reviewed this encounter.   Valentino Nose, NP 06/01/22 Windell Moment

## 2022-06-01 NOTE — Discharge Instructions (Addendum)
You have a viral upper respiratory infection.  Symptoms should improve over the next week to 10 days.  If you develop chest pain or shortness of breath, go to the emergency room.  We have tested you today for COVID-19 and influenza.  You will see the results in Mychart and we will call you with positive results.    Please stay home and isolate until you are aware of the results.    Some things that can make you feel better are: - Increased rest - Increasing fluid with water/sugar free electrolytes - Acetaminophen and ibuprofen as needed for fever/pain - Salt water gargling, chloraseptic spray and throat lozenges - OTC guaifenesin (Mucinex) 600 mg twice daily - Saline sinus flushes or a neti pot - Humidifying the air - Flonase for ear pain

## 2023-04-07 ENCOUNTER — Other Ambulatory Visit: Payer: Self-pay

## 2023-04-07 ENCOUNTER — Emergency Department (HOSPITAL_COMMUNITY): Payer: Self-pay

## 2023-04-07 ENCOUNTER — Emergency Department (HOSPITAL_COMMUNITY)
Admission: EM | Admit: 2023-04-07 | Discharge: 2023-04-07 | Disposition: A | Discharge status: Home / Self Care | Payer: Self-pay

## 2023-04-07 ENCOUNTER — Encounter (HOSPITAL_COMMUNITY): Payer: Self-pay

## 2023-04-07 DIAGNOSIS — R0789 Other chest pain: Secondary | ICD-10-CM | POA: Insufficient documentation

## 2023-04-07 LAB — CBC
HCT: 37.2 % (ref 36.0–46.0)
Hemoglobin: 12.1 g/dL (ref 12.0–15.0)
MCH: 28.8 pg (ref 26.0–34.0)
MCHC: 32.5 g/dL (ref 30.0–36.0)
MCV: 88.6 fL (ref 80.0–100.0)
Platelets: 369 10*3/uL (ref 150–400)
RBC: 4.2 MIL/uL (ref 3.87–5.11)
RDW: 12.6 % (ref 11.5–15.5)
WBC: 6.4 10*3/uL (ref 4.0–10.5)
nRBC: 0 % (ref 0.0–0.2)

## 2023-04-07 LAB — BASIC METABOLIC PANEL
Anion gap: 8 (ref 5–15)
BUN: 15 mg/dL (ref 6–20)
CO2: 22 mmol/L (ref 22–32)
Calcium: 8.9 mg/dL (ref 8.9–10.3)
Chloride: 106 mmol/L (ref 98–111)
Creatinine, Ser: 0.86 mg/dL (ref 0.44–1.00)
GFR, Estimated: >60 mL/min (ref >60)
Glucose, Bld: 95 mg/dL (ref 70–99)
Potassium: 3.8 mmol/L (ref 3.5–5.1)
Sodium: 136 mmol/L (ref 135–145)

## 2023-04-07 LAB — TROPONIN I (HIGH SENSITIVITY)
Troponin I (High Sensitivity): <2 ng/L (ref <18)
Troponin I (High Sensitivity): 4 ng/L (ref <18)

## 2023-04-07 LAB — HCG, SERUM, QUALITATIVE: Preg, Serum: NEGATIVE

## 2023-04-07 NOTE — ED Provider Notes (Signed)
[place] EMERGENCY DEPARTMENT AT [facility] Provider Note   CSN: [id number] Arrival date & time: 04/07/23  1108     History  Chief Complaint  Patient presents with   Chest Pain    Miranda Bowen is a 37 y.o. female.  With history of tubal ligation presenting for evaluation of right-sided chest pain and lightheadedness.  Symptoms have been intermittent since Saturday.  She was standing outside with her children when she first noticed the symptoms.  Pain is described as a sharp sensation.  Does not radiate from the right side of her chest.  No associated nausea, vomiting or diaphoresis.  No history of CAD or other cardiac diseases.  No family history of CAD or MI.  Symptoms are not exertional.  She denies any shortness of breath, cough, hemoptysis, unilateral leg swelling, history of DVT or PE, OCP use, recent cancer treatments, surgeries, long distance travel.  She does not feel the pain at this time.  She feels slightly weak because she has not eaten anything all day today.  She also feels some intermittent lightheadedness.  This is at random with no exacerbating factors.   Chest Pain      Home Medications Prior to Admission medications   Medication Sig Start Date End Date Taking? Authorizing Provider  acetaminophen (TYLENOL) 325 MG tablet Take 325-650 mg by mouth every 6 (six) hours as needed (for pain or headaches).    [provider]  cyclobenzaprine (FLEXERIL) 5 MG tablet Take 1 tablet (5 mg total) by mouth 3 (three) times daily as needed for muscle spasms. Patient not taking: Reported on 01/30/2017 06/11/16   Janne Napoleon, NP  erythromycin ophthalmic ointment Place a 1/2 inch ribbon of ointment into the lower eyelid. 10/30/20   Maxwell Caul, PA-C  ibuprofen (ADVIL,MOTRIN) 600 MG tablet Take 1 tablet (600 mg total) by mouth every 6 (six) hours as needed. 01/30/17   Derwood Kaplan, MD  Multiple Vitamin (MULTIVITAMIN PO) Take by mouth.    [provider]  Prenatal Multivit-Min-Fe-FA (PRENATAL VITAMINS) 0.8 MG tablet Take 1 tablet by mouth daily. Patient not taking: Reported on 11/05/2015 02/07/15   Amedeo Gory, CNM  promethazine (PHENERGAN) 25 MG tablet Take 1 tablet (25 mg total) by mouth every 6 (six) hours as needed for nausea. 01/30/17   Derwood Kaplan, MD      Allergies    Patient has no known allergies.    Review of Systems   Review of Systems  Cardiovascular:  Positive for chest pain.  Neurological:  Positive for light-headedness.  All other systems reviewed and are negative.   Physical Exam Updated Vital Signs BP 117/85   Pulse (!) 57   Temp 98.2 F (36.8 C) (Oral)   Resp 17   Ht 5\' 7"  (1.702 m)   Wt 67.1 kg   SpO2 100%   BMI 23.17 kg/m  Physical Exam Vitals and nursing note reviewed.  Constitutional:      General: She is not in acute distress.    Appearance: She is well-developed.     Comments: Resting comfortably in bed  HENT:     Head: Normocephalic and atraumatic.  Eyes:     Conjunctiva/sclera: Conjunctivae normal.  Cardiovascular:     Rate and Rhythm: Normal rate and regular rhythm.     Heart sounds: No murmur heard. Pulmonary:     Effort: Pulmonary effort is normal. No respiratory distress.     Breath sounds: Normal breath sounds.  No decreased breath sounds, wheezing or rhonchi.  Abdominal:     Palpations: Abdomen is soft.     Tenderness: There is no abdominal tenderness.  Musculoskeletal:        General: No swelling.     Cervical back: Neck supple.     Right lower leg: No edema.     Left lower leg: No edema.  Skin:    General: Skin is warm and dry.     Capillary Refill: Capillary refill takes less than 2 seconds.  Neurological:     General: No focal deficit present.     Mental Status: She is alert and oriented to person, place, and time.  Psychiatric:        Mood and Affect: Mood is anxious.     ED Results / Procedures / Treatments   Labs (all labs ordered are  listed, but only abnormal results are displayed) Labs Reviewed  BASIC METABOLIC PANEL  CBC  HCG, SERUM, QUALITATIVE  TROPONIN I (HIGH SENSITIVITY)  TROPONIN I (HIGH SENSITIVITY)    EKG None  Radiology DG Chest 2 View  Result Date: 04/07/2023 CLINICAL DATA:  Chest pain and dizziness, chest tightness for 5 days EXAM: CHEST - 2 VIEW COMPARISON:  Radiograph 01/30/2017 FINDINGS: The cardiomediastinal silhouette is normal There is no focal consolidation or pulmonary edema. There is no pleural effusion or pneumothorax There is no acute osseous abnormality. IMPRESSION: No radiographic evidence of acute cardiopulmonary process. Electronically Signed   By: Lesia Hausen M.D.   On: 04/07/2023 13:35    Procedures Procedures    Medications Ordered in ED Medications - No data to display  ED Course/ Medical Decision Making/ A&P             HEART Score: 1                  PERC Score: 0, PERC Score Interpretation: No need for further workup, as <2% chance of PE.  If no criteria are positive and clinicians pre-test probability is <15%, PERC Rule criteria are satisfied Medical Decision Making Amount and/or Complexity of Data Reviewed Labs: ordered. Radiology: ordered.   This patient presents to the ED for concern of chest pain, this involves an extensive number of treatment options, and is a complaint that carries with it a high risk of complications and morbidity.  The emergent differential diagnosis of chest pain includes: Acute coronary syndrome, pericarditis, aortic dissection, pulmonary embolism, tension pneumothorax, and esophageal rupture.  I do not believe the patient has an emergent cause of chest pain, other urgent/non-acute considerations include, but are not limited to: chronic angina, aortic stenosis, cardiomyopathy, myocarditis, mitral valve prolapse, pulmonary hypertension, hypertrophic obstructive cardiomyopathy (HOCM), aortic insufficiency, right ventricular hypertrophy, pneumonia,  pleuritis, bronchitis, pneumothorax, tumor, gastroesophageal reflux disease (GERD), esophageal spasm, Mallory-Weiss syndrome, peptic ulcer disease, biliary disease, pancreatitis, functional gastrointestinal pain, cervical or thoracic disk disease or arthritis, shoulder arthritis, costochondritis, subacromial bursitis, anxiety or panic attack, herpes zoster, breast disorders, chest wall tumors, thoracic outlet syndrome, mediastinitis.  My initial workup includes ACS rule out  Additional history obtained from: Nursing notes from this visit.  I ordered, reviewed and interpreted labs which include: CBC, BMP, troponin, hCG workup reassuring.  Initial and delta troponin negative.  I ordered imaging studies including chest x-ray. I independently visualized and interpreted imaging which showed negative. I agree with the radiologist interpretation  Cardiac Monitoring:  The patient was maintained on a cardiac monitor.  I personally viewed and interpreted the cardiac monitored  which showed an underlying rhythm of: NSR  Afebrile, hemodynamically stable.  37 year old female presenting for evaluation of right-sided chest pain.  This is intermittent over the past 5 days.  On exam, she has no pain.  She is resting comfortably in bed.  She is nontachypneic, nontachycardic and nonhypoxic.  No fevers.  Lab workup was overall reassuring.  Chest x-ray is without acute cardiopulmonary abnormalities.  EKG without ischemic changes.  Heart score of 1 and I have low suspicion for ACS.  She is also PERC negative and I have low suspicion for PE.  Overall suspect a component of anxiety as she brought this up multiple times during my interview.  Patient was encouraged to follow-up with her PCP for reevaluation in 1 week.  She was encouraged to return with any new or worsening symptoms.  Stable at discharge.  At this time there does not appear to be any evidence of an acute emergency medical condition and the patient appears  stable for discharge with appropriate outpatient follow up. Diagnosis was discussed with patient who verbalizes understanding of care plan and is agreeable to discharge. I have discussed return precautions with patient who verbalizes understanding. Patient encouraged to follow-up with their PCP within 1 week. All questions answered.  Note: Portions of this report may have been transcribed using voice recognition software. Every effort was made to ensure accuracy; however, inadvertent computerized transcription errors may still be present.        Final Clinical Impression(s) / ED Diagnoses Final diagnoses:  Atypical chest pain    Rx / DC Orders ED Discharge Orders     None         Michelle Piper, PA-C 04/07/23 1443    Coral Spikes, DO 04/07/23 [phone]

## 2023-04-07 NOTE — ED Triage Notes (Signed)
Pt c/o non radiating right sided chest pain, dizziness and lightheadednessx5d.

## 2023-04-07 NOTE — Discharge Instructions (Signed)
You have been seen today for your complaint of chest pain. Your lab work was reassuring. Your imaging was reassuring. Follow up with: Your PCP in 1 week for reevaluation Please seek immediate medical care if you develop any of the following symptoms: You have nausea or vomiting. You feel sweaty or light-headed. You have a cough with mucus from your lungs (sputum) or you cough up blood. You develop shortness of breath. At this time there does not appear to be the presence of an emergent medical condition, however there is always the potential for conditions to change. Please read and follow the below instructions.  Do not take your medicine if  develop an itchy rash, swelling in your mouth or lips, or difficulty breathing; call 911 and seek immediate emergency medical attention if this occurs.  You may review your lab tests and imaging results in their entirety on your MyChart account.  Please discuss all results of fully with your primary care provider and other specialist at your follow-up visit.  Note: Portions of this text may have been transcribed using voice recognition software. Every effort was made to ensure accuracy; however, inadvertent computerized transcription errors may still be present.

## 2023-10-22 ENCOUNTER — Emergency Department (HOSPITAL_COMMUNITY): Payer: Self-pay

## 2023-10-22 ENCOUNTER — Emergency Department (HOSPITAL_COMMUNITY)
Admission: EM | Admit: 2023-10-22 | Discharge: 2023-10-22 | Disposition: A | Discharge status: Home / Self Care | Payer: Self-pay | Attending: Emergency Medicine | Admitting: Emergency Medicine

## 2023-10-22 ENCOUNTER — Other Ambulatory Visit: Payer: Self-pay

## 2023-10-22 DIAGNOSIS — R109 Unspecified abdominal pain: Secondary | ICD-10-CM | POA: Diagnosis not present

## 2023-10-22 DIAGNOSIS — N3289 Other specified disorders of bladder: Secondary | ICD-10-CM | POA: Diagnosis not present

## 2023-10-22 DIAGNOSIS — N390 Urinary tract infection, site not specified: Secondary | ICD-10-CM | POA: Insufficient documentation

## 2023-10-22 DIAGNOSIS — R748 Abnormal levels of other serum enzymes: Secondary | ICD-10-CM | POA: Insufficient documentation

## 2023-10-22 LAB — COMPREHENSIVE METABOLIC PANEL WITH GFR
ALT: 22 U/L (ref 0–44)
AST: 24 U/L (ref 15–41)
Albumin: 3.1 g/dL — ABNORMAL LOW (ref 3.5–5.0)
Alkaline Phosphatase: 60 U/L (ref 38–126)
Anion gap: 8 (ref 5–15)
BUN: 15 mg/dL (ref 6–20)
CO2: 23 mmol/L (ref 22–32)
Calcium: 8.2 mg/dL — ABNORMAL LOW (ref 8.9–10.3)
Chloride: 106 mmol/L (ref 98–111)
Creatinine, Ser: 0.9 mg/dL (ref 0.44–1.00)
GFR, Estimated: >60 mL/min (ref >60)
Glucose, Bld: 100 mg/dL — ABNORMAL HIGH (ref 70–99)
Potassium: 3.5 mmol/L (ref 3.5–5.1)
Sodium: 137 mmol/L (ref 135–145)
Total Bilirubin: 0.6 mg/dL (ref 0.0–1.2)
Total Protein: 6.6 g/dL (ref 6.5–8.1)

## 2023-10-22 LAB — CBC
HCT: 38.4 % (ref 36.0–46.0)
Hemoglobin: 12.3 g/dL (ref 12.0–15.0)
MCH: 28.3 pg (ref 26.0–34.0)
MCHC: 32 g/dL (ref 30.0–36.0)
MCV: 88.3 fL (ref 80.0–100.0)
Platelets: 338 10*3/uL (ref 150–400)
RBC: 4.35 MIL/uL (ref 3.87–5.11)
RDW: 12.9 % (ref 11.5–15.5)
WBC: 6.4 10*3/uL (ref 4.0–10.5)
nRBC: 0 % (ref 0.0–0.2)

## 2023-10-22 LAB — URINALYSIS, ROUTINE W REFLEX MICROSCOPIC
Bilirubin Urine: NEGATIVE
Glucose, UA: NEGATIVE mg/dL
Ketones, ur: NEGATIVE mg/dL
Leukocytes,Ua: NEGATIVE
Nitrite: POSITIVE — AB
Protein, ur: NEGATIVE mg/dL
Specific Gravity, Urine: 1.041 — ABNORMAL HIGH (ref 1.005–1.030)
pH: 6 (ref 5.0–8.0)

## 2023-10-22 LAB — HCG, SERUM, QUALITATIVE: Preg, Serum: NEGATIVE

## 2023-10-22 LAB — LIPASE, BLOOD: Lipase: 54 U/L — ABNORMAL HIGH (ref 11–51)

## 2023-10-22 MED ORDER — MORPHINE SULFATE (PF) 4 MG/ML IV SOLN
4.0000 mg | Freq: Once | INTRAVENOUS | Status: AC
  Administered 2023-10-22: 4 mg via INTRAVENOUS
  Filled 2023-10-22: qty 1

## 2023-10-22 MED ORDER — ONDANSETRON 4 MG PO TBDP: 4.0000 mg | ORAL_TABLET | Freq: Three times a day (TID) | ORAL | 0 refills | Status: AC | PRN

## 2023-10-22 MED ORDER — IOHEXOL 350 MG/ML SOLN
75.0000 mL | Freq: Once | INTRAVENOUS | Status: AC | PRN
  Administered 2023-10-22: 75 mL via INTRAVENOUS

## 2023-10-22 MED ORDER — SODIUM CHLORIDE 0.9 % IV BOLUS
1000.0000 mL | Freq: Once | INTRAVENOUS | Status: AC
  Administered 2023-10-22: 1000 mL via INTRAVENOUS

## 2023-10-22 MED ORDER — ONDANSETRON HCL 4 MG/2ML IJ SOLN
4.0000 mg | Freq: Once | INTRAMUSCULAR | Status: AC
  Administered 2023-10-22: 4 mg via INTRAVENOUS
  Filled 2023-10-22: qty 2

## 2023-10-22 MED ORDER — CEPHALEXIN 500 MG PO CAPS: 500.0000 mg | ORAL_CAPSULE | Freq: Four times a day (QID) | ORAL | 0 refills | Status: AC

## 2023-10-22 NOTE — Discharge Instructions (Addendum)
 Your urine show UTI. I have sent an antibiotic, please take as prescribed.   Your CT scan show fluid in your appendix but dose not have sign of infection or inflammation. If you start feeling right lower abdominal pain, you need to come back.   I would recommend staying well-hydrated with primarily water you can alternate Pedialyte and electrolyte drink.  I have sent Zofran to use as needed for nausea and vomiting.  I recommend bland foods like toast, applesauce.  If you develop diarrhea you can try Pepto-Bismol or Imodium.  Continue taking Tylenol as needed for discomfort.  Follow-up with primary care and return to emergency room if you are no longer tolerating oral intake.

## 2023-10-22 NOTE — ED Notes (Signed)
 This RN reviewed discharge instructions with patient. She verbalized understanding and denied any further questions. PT well appearing upon discharge and reports no pain. Pt ambulated with stable gait to exit. Pt endorses ride home.

## 2023-10-22 NOTE — ED Triage Notes (Signed)
 Patient reports generalized abdominal pain with nausea onset Wednesday this week , no emesis or diarrhea . Mild nausea , denies fever or chills.

## 2023-10-22 NOTE — ED Provider Notes (Signed)
 [place] EMERGENCY DEPARTMENT AT [facility] Provider Note   CSN: [id number] Arrival date & time: 10/22/23  4098     History  Chief Complaint  Patient presents with   Abdominal Pain    Miranda Bowen is a 38 y.o. female. With history of c-section, tubal ligation presenting to the emergency room with complaint of 4 days of abdominal pain,it is generalized, constant associated with decreased appetite and significant nausea. Today she started to have generalized weakness, reports she has not been drinking and eating as much as normal since she is not feeling well.  Last BM was 3 days ago. LMP 2 weeks ago. No associated diarrhea, constipation, and vomiting. Has a sick contact. No recent antibiotics. No recent travel. No fever. No chest pain, SHOB, URI like symptoms.    Abdominal Pain      Home Medications Prior to Admission medications   Medication Sig Start Date End Date Taking? Authorizing Provider  acetaminophen (TYLENOL) 325 MG tablet Take 325-650 mg by mouth every 6 (six) hours as needed (for pain or headaches).    [provider]  cyclobenzaprine (FLEXERIL) 5 MG tablet Take 1 tablet (5 mg total) by mouth 3 (three) times daily as needed for muscle spasms. Patient not taking: Reported on 01/30/2017 06/11/16   Hardie Leyland, NP  erythromycin ophthalmic ointment Place a 1/2 inch ribbon of ointment into the lower eyelid. 10/30/20   Layden, Lindsey A, PA-C  ibuprofen (ADVIL,MOTRIN) 600 MG tablet Take 1 tablet (600 mg total) by mouth every 6 (six) hours as needed. 01/30/17   Deatra Face, MD  Multiple Vitamin (MULTIVITAMIN PO) Take by mouth.    [provider]  Prenatal Multivit-Min-Fe-FA (PRENATAL VITAMINS) 0.8 MG tablet Take 1 tablet by mouth daily. Patient not taking: Reported on 11/05/2015 02/07/15   Charlett Conroy, CNM  promethazine (PHENERGAN) 25 MG tablet Take 1 tablet (25 mg total) by mouth every 6 (six) hours as needed for nausea. 01/30/17    Deatra Face, MD      Allergies    Patient has no known allergies.    Review of Systems   Review of Systems  Gastrointestinal:  Positive for abdominal pain.    Physical Exam Updated Vital Signs BP 129/79 (BP Location: Right Arm)   Pulse 69   Temp 98 F (36.7 C)   Resp 16   SpO2 100%  Physical Exam Vitals and nursing note reviewed.  Constitutional:      General: She is not in acute distress.    Appearance: She is not toxic-appearing.  HENT:     Head: Normocephalic and atraumatic.  Eyes:     General: No scleral icterus.    Conjunctiva/sclera: Conjunctivae normal.  Cardiovascular:     Rate and Rhythm: Normal rate and regular rhythm.     Pulses: Normal pulses.     Heart sounds: Normal heart sounds.  Pulmonary:     Effort: Pulmonary effort is normal. No respiratory distress.     Breath sounds: Normal breath sounds.  Abdominal:     General: Abdomen is flat. Bowel sounds are normal. There is no distension.     Palpations: Abdomen is soft. There is no mass.     Tenderness: There is abdominal tenderness.     Comments: Generalized tenderness to palpation.   Musculoskeletal:     Right lower leg: No edema.     Left lower leg: No edema.  Skin:    General: Skin is warm and dry.  Findings: No lesion.  Neurological:     General: No focal deficit present.     Mental Status: She is alert and oriented to person, place, and time. Mental status is at baseline.     ED Results / Procedures / Treatments   Labs (all labs ordered are listed, but only abnormal results are displayed) Labs Reviewed  LIPASE, BLOOD - Abnormal; Notable for the following components:      Result Value   Lipase 54 (*)    All other components within normal limits  COMPREHENSIVE METABOLIC PANEL WITH GFR - Abnormal; Notable for the following components:   Glucose, Bld 100 (*)    Calcium 8.2 (*)    Albumin 3.1 (*)    All other components within normal limits  CBC  HCG, SERUM, QUALITATIVE   URINALYSIS, ROUTINE W REFLEX MICROSCOPIC    EKG None  Radiology CT ABDOMEN PELVIS W CONTRAST Result Date: 10/22/2023 CLINICAL DATA:  Abdominal pain, acute, nonlocalized EXAM: CT ABDOMEN AND PELVIS WITH CONTRAST TECHNIQUE: Multidetector CT imaging of the abdomen and pelvis was performed using the standard protocol following bolus administration of intravenous contrast. RADIATION DOSE REDUCTION: This exam was performed according to the departmental dose-optimization program which includes automated exposure control, adjustment of the mA and/or kV according to patient size and/or use of iterative reconstruction technique. CONTRAST:  75mL OMNIPAQUE IOHEXOL 350 MG/ML SOLN COMPARISON:  January 30, 2017 FINDINGS: Lower chest: No focal airspace consolidation or pleural effusion. Hepatobiliary: No mass.No radiopaque stones or wall thickening of the gallbladder.No intrahepatic or extrahepatic biliary ductal dilation.The portal veins are patent. Pancreas: No mass or main ductal dilation.No peripancreatic inflammation or fluid collection. Spleen: Normal size. No mass. Adrenals/Urinary Tract: No adrenal masses. No renal mass. No nephrolithiasis or hydronephrosis. Circumferential wall thickening of the urinary bladder. Stomach/Bowel: The stomach is decompressed without focal abnormality. No small bowel wall thickening or inflammation. No small bowel obstruction. Fluid-filled appendix measuring 7 mm in the right lower quadrant extending into the rectouterine pouch. Tiny appendicolith within the appendiceal tip. No significant wall thickening or periappendiceal inflammation. Vascular/Lymphatic: No aortic aneurysm. No intraabdominal or pelvic lymphadenopathy. Reproductive: The uterus and ovaries are within normal limits for patient's age. Dominant follicle in the right ovary measuring 2 cm.Small volume free fluid in the pelvis. Other: No pneumoperitoneum, ascites, or mesenteric inflammation. Musculoskeletal: No acute fracture  or destructive lesion. IMPRESSION: 1. Circumferential wall thickening of the urinary bladder, which is likely due to underdistension. If there is concern for acute cystitis, correlation with urinalysis would be recommended. 2. Fluid-filled, distended appendix measuring 7 mm. No significant wall thickening or periappendiceal inflammation visualized to suggest acute appendicitis. 3. Dominant follicle in the right ovary measured 2 cm. Small volume free fluid in the pelvis, likely physiologic in a female of this age. Electronically Signed   By: Rance Burrows M.D.   On: 10/22/2023 08:27    Procedures Procedures    Medications Ordered in ED Medications - No data to display  ED Course/ Medical Decision Making/ A&P                                 Medical Decision Making Amount and/or Complexity of Data Reviewed Labs: ordered. Radiology: ordered.  Risk Prescription drug management.   This patient presents to the ED for concern of abdominal pain, this involves an extensive number of treatment options, and is a complaint that carries with it a high risk  of complications and morbidity.  The differential diagnosis includes cholecystitis, kidney stone, pancreatitis, small bowel obstruction, mass, appendicitis, diverticulitis, gastroenteritis, dehydration, electrolyte abnormality, urinary tract infection   Co morbidities that complicate the patient evaluation  Tubal ligation    Additional history obtained:  Additional history obtained from ER visit 04/07/23   Lab Tests:  I personally interpreted labs.  The pertinent results include:   Hcg negative. CBC without leukocytosis, no anemia. Lipase 54. CMP unremarkable.    Imaging Studies ordered:  I ordered imaging studies including CT abd/pelvis  I independently visualized and interpreted imaging which showed distended appendix with no acute appendicitis (repeat abdominal exam show no RLQ pain, negative Rovsing's), possible UTI (has  positive UA) I agree with the radiologist interpretation   Cardiac Monitoring: / EKG:  The patient was maintained on a cardiac monitor.     Consultations Obtained:  None   Problem List / ED Course / Critical interventions / Medication management  Patient presenting to emergency room with complaint of abdominal pain. She is hemodynamically table and appear well. No fever or chills. She dose have prior c-section surgery and tubal ligation. LMP was 2 weeks ago and reports it did not feel like a normal period. On my exam, abdomen is soft and nondistended but she has generalized abdominal pain. Appears well hydrated, but dose report generalized. She has no focal weakness, A&O. She does have active nausea but she has not had any vomiting.  Denies diarrhea. She dose have sick contact. She also has reassuring labs with do not reflect significant LFT dysfunction, normal kidney function and has very minimally elevated lipase at 54. She denies significant urinary symptoms thus doubt UTI as cause. Given significance of discomfort will order CT scan to rule out acute pathology. Will treat nausea and generalized weakness with NS and reassess.   Labs are reassuring. Discussed CT scan and given return precautions. Patient appears well with normal vitals. She is tolerating oral intake. She feels better after fluids and symptom control. No significant commodities. Feel she is stable for discharge with outpatient follow up.   I ordered medication including NS, zofran, morphine  Reevaluation of the patient after these medicines showed that the patient improved I have reviewed the patients home medicines and have made adjustments as needed   Plan  F/u w/ PCP in 2-3d to ensure resolution of sx.  Patient was given return precautions. Patient stable for discharge at this time.  Patient educated on sx/dx and verbalized understanding of plan. Return to ER w/ new or worsening sx.          Final Clinical  Impression(s) / ED Diagnoses Final diagnoses:  Lower urinary tract infectious disease    Rx / DC Orders ED Discharge Orders     None         Eudora Heron, PA-C 10/22/23 0913    Kingsley, Victoria K, DO 10/22/23 0940

## 2023-10-24 LAB — URINE CULTURE: Culture: >=100000 — AB

## 2023-10-25 ENCOUNTER — Telehealth (HOSPITAL_BASED_OUTPATIENT_CLINIC_OR_DEPARTMENT_OTHER): Payer: Self-pay

## 2023-10-25 NOTE — Telephone Encounter (Signed)
 Post ED Visit - Positive Culture Follow-up  Culture report reviewed by antimicrobial stewardship pharmacist: [facility] []  Court Distance, Pharm.D. []  Skeet Duke, Pharm.D., BCPS AQ-ID []  Leslee Rase, Pharm.D., BCPS []  Garland Junk, Pharm.D., BCPS []  [place], [address].D., BCPS, AAHIVP []  Alcide Aly, Pharm.D., BCPS, AAHIVP []  Jerri Morale, PharmD, BCPS []  Graham Laws, PharmD, BCPS []  Cleda Curly, PharmD, BCPS []  Tamar Fairly, PharmD []  Ballard Levels, PharmD, BCPS []  Ollen Beverage, PharmD X   Argentina Bees, PharmD  [facility] []  Arlyne Bering, PharmD []  Sherryle Don, PharmD []  Van Gelinas, PharmD []  Delila Felty, Rph []  [place]) Cleora Daft, PharmD []  Augustina Block, PharmD []  Arie Kurtz, PharmD []  Sharlyn Deaner, PharmD []  Agnes Hose, PharmD []  Kendall Pauls, PharmD []  Gladstone Lamer, PharmD []  Armanda Bern, PharmD []  Tera Fellows, PharmD   Positive urine culture Treated with Cephalexin, organism sensitive to the same and no further patient follow-up is required at this time.  [name] 10/25/2023, 10:12 AM

## 2024-01-18 DIAGNOSIS — S99921A Unspecified injury of right foot, initial encounter: Secondary | ICD-10-CM | POA: Diagnosis not present

## 2024-01-18 DIAGNOSIS — M79674 Pain in right toe(s): Secondary | ICD-10-CM | POA: Diagnosis not present

## 2024-04-11 ENCOUNTER — Ambulatory Visit (HOSPITAL_COMMUNITY)
Admission: EM | Admit: 2024-04-11 | Discharge: 2024-04-11 | Disposition: A | Discharge status: Home / Self Care | Attending: Emergency Medicine | Admitting: Emergency Medicine

## 2024-04-11 ENCOUNTER — Encounter (HOSPITAL_COMMUNITY): Payer: Self-pay

## 2024-04-11 DIAGNOSIS — R519 Headache, unspecified: Secondary | ICD-10-CM | POA: Insufficient documentation

## 2024-04-11 DIAGNOSIS — R10A2 Flank pain, left side: Secondary | ICD-10-CM | POA: Insufficient documentation

## 2024-04-11 LAB — POCT URINALYSIS DIP (MANUAL ENTRY)
Bilirubin, UA: NEGATIVE
Glucose, UA: NEGATIVE mg/dL
Ketones, POC UA: NEGATIVE mg/dL
Leukocytes, UA: NEGATIVE
Nitrite, UA: POSITIVE — AB
Protein Ur, POC: NEGATIVE mg/dL
Spec Grav, UA: >=1.03 — AB (ref 1.010–1.025)
Urobilinogen, UA: 0.2 U/dL
pH, UA: 6 (ref 5.0–8.0)

## 2024-04-11 LAB — POCT URINE PREGNANCY: Preg Test, Ur: NEGATIVE

## 2024-04-11 MED ORDER — KETOROLAC TROMETHAMINE 30 MG/ML IJ SOLN
30.0000 mg | Freq: Once | INTRAMUSCULAR | Status: AC
  Administered 2024-04-11: 30 mg via INTRAMUSCULAR

## 2024-04-11 MED ORDER — KETOROLAC TROMETHAMINE 30 MG/ML IJ SOLN
INTRAMUSCULAR | Status: AC
Start: 2024-04-11 — End: 2024-04-11
  Filled 2024-04-11: qty 1

## 2024-04-11 MED ORDER — SULFAMETHOXAZOLE-TRIMETHOPRIM 800-160 MG PO TABS: 1.0000 | ORAL_TABLET | Freq: Two times a day (BID) | ORAL | 0 refills | Status: AC

## 2024-04-11 NOTE — ED Provider Notes (Signed)
 [facility]    CSN: [id number] Arrival date & time: 04/11/24  0841      History   Chief Complaint Chief Complaint  Patient presents with   Back Pain    HPI Miranda Bowen is a 38 y.o. female.   Patient presents with left mid back pain that began about a week ago.  Patient states the pain has worsened today.  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  Patient denies any recent falls or known injuries.  Patient also denies dysuria, hematuria, urinary frequency/urgency, abdominal pain, nausea, vomiting, and fever.  Patient states that she also woke up this morning with a left-sided headache with photosensitivity.  Patient states that she has taken Advil  without relief of this.  Patient denies blurred vision, dizziness, weakness, numbness, and confusion.  The history is provided by the patient and medical records.  Back Pain   Past Medical History:  Diagnosis Date   Heartburn during pregnancy     There are no active problems to display for this patient.   Past Surgical History:  Procedure Laterality Date   CESAREAN SECTION N/A 03/26/2014   Procedure: CESAREAN SECTION;  Surgeon: Harland JAYSON Birkenhead, MD;  Location: [facility];  Service: Obstetrics;  Laterality: N/A;   CESAREAN SECTION     CESAREAN SECTION N/A 09/24/2015   Procedure: CESAREAN SECTION;  Surgeon: Winton Felt, MD;  Location: [facility];  Service: Obstetrics;  Laterality: N/A;   TUBAL LIGATION      OB History     Gravida  3   Para  3   Term  3   Preterm  0   AB  0   Living  3      SAB  0   IAB  0   Ectopic  0   Multiple  0   Live Births  3            Home Medications    Prior to Admission medications   Medication Sig Start Date End Date Taking? Authorizing Provider  sulfamethoxazole-trimethoprim (BACTRIM DS) 800-160 MG tablet Take 1 tablet by mouth 2 (two)  times daily for 3 days. 04/11/24 04/14/24 Yes Johnie Flaming A, NP  acetaminophen  (TYLENOL ) 325 MG tablet Take 325-650 mg by mouth every 6 (six) hours as needed (for pain or headaches).    [provider]  cephALEXin  (KEFLEX ) 500 MG capsule Take 1 capsule (500 mg total) by mouth 4 (four) times daily. 10/22/23   Barrett, Jamie N, PA-C  cyclobenzaprine  (FLEXERIL ) 5 MG tablet Take 1 tablet (5 mg total) by mouth 3 (three) times daily as needed for muscle spasms. Patient not taking: Reported on 01/30/2017 06/11/16   Jamelle Lorrayne HERO, NP  erythromycin  ophthalmic ointment Place a 1/2 inch ribbon of ointment into the lower eyelid. 10/30/20   Layden, Lindsey A, PA-C  ibuprofen  (ADVIL ,MOTRIN ) 600 MG tablet Take 1 tablet (600 mg total) by mouth every 6 (six) hours as needed. 01/30/17   Charlyn Sora, MD  Multiple Vitamin (MULTIVITAMIN PO) Take by mouth.    [provider]  ondansetron  (ZOFRAN -ODT) 4 MG disintegrating tablet Take 1 tablet (4 mg total) by mouth every 8 (eight) hours as needed for nausea or vomiting. 10/22/23   Barrett, Warren SAILOR, PA-C  Prenatal Multivit-Min-Fe-FA (PRENATAL VITAMINS) 0.8 MG tablet Take 1 tablet by mouth daily. Patient not  taking: Reported on 11/05/2015 02/07/15   Teresia Lani SAILOR, CNM  promethazine  (PHENERGAN ) 25 MG tablet Take 1 tablet (25 mg total) by mouth every 6 (six) hours as needed for nausea. 01/30/17   Charlyn Sora, MD    Family History Family History  Problem Relation Age of Onset   Hypertension Mother     Social History Social History   Tobacco Use   Smoking status: Never   Smokeless tobacco: Never  Vaping Use   Vaping status: Never Used  Substance Use Topics   Alcohol use: Yes    Comment: occ   Drug use: No     Allergies   Patient has no known allergies.   Review of Systems Review of Systems  Musculoskeletal:  Positive for back pain.   Per HPI  Physical Exam Triage Vital Signs ED Triage Vitals  Encounter Vitals Group      BP 04/11/24 0944 128/87     Girls Systolic BP Percentile --      Girls Diastolic BP Percentile --      Boys Systolic BP Percentile --      Boys Diastolic BP Percentile --      Pulse Rate 04/11/24 0944 63     Resp 04/11/24 0944 16     Temp 04/11/24 0944 97.9 F (36.6 C)     Temp Source 04/11/24 0944 Oral     SpO2 04/11/24 0944 98 %     Weight --      Height --      Head Circumference --      Peak Flow --      Pain Score 04/11/24 0943 10     Pain Loc --      Pain Education --      Exclude from Growth Chart --    No data found.  Updated Vital Signs BP 128/87 (BP Location: Right Arm)   Pulse 63   Temp 97.9 F (36.6 C) (Oral)   Resp 16   LMP 04/04/2024 (Approximate)   SpO2 98%   Visual Acuity Right Eye Distance:   Left Eye Distance:   Bilateral Distance:    Right Eye Near:   Left Eye Near:    Bilateral Near:     Physical Exam Vitals and nursing note reviewed.  Constitutional:      General: She is awake. She is not in acute distress.    Appearance: Normal appearance. She is well-developed and well-groomed. She is ill-appearing. She is not toxic-appearing or diaphoretic.  HENT:     Head: Normocephalic.     Right Ear: Tympanic membrane, ear canal and external ear normal.     Left Ear: Tympanic membrane, ear canal and external ear normal.     Nose: Nose normal.     Mouth/Throat:     Mouth: Mucous membranes are moist.     Pharynx: Oropharynx is clear.  Eyes:     Extraocular Movements: Extraocular movements intact.     Conjunctiva/sclera: Conjunctivae normal.     Pupils: Pupils are equal, round, and reactive to light.  Cardiovascular:     Rate and Rhythm: Normal rate and regular rhythm.  Pulmonary:     Effort: Pulmonary effort is normal.     Breath sounds: Normal breath sounds.  Abdominal:     General: Abdomen is flat. Bowel sounds are normal. There is no distension.     Palpations: Abdomen is soft. There is no mass.     Tenderness: There is no abdominal  tenderness. There is left CVA tenderness. There is no right CVA tenderness, guarding or rebound.     Hernia: No hernia is present.  Musculoskeletal:     Cervical back: Normal, normal range of motion and neck supple.     Thoracic back: Tenderness present. No swelling, edema, deformity, signs of trauma, lacerations, spasms or bony tenderness. Normal range of motion.     Lumbar back: Normal.       Back:  Skin:    General: Skin is warm and dry.  Neurological:     General: No focal deficit present.     Mental Status: She is alert and oriented to person, place, and time. Mental status is at baseline.     GCS: GCS eye subscore is 4. GCS verbal subscore is 5. GCS motor subscore is 6.     Cranial Nerves: Cranial nerves 2-12 are intact.     Sensory: Sensation is intact.     Motor: Motor function is intact.     Coordination: Coordination is intact.     Gait: Gait is intact.  Psychiatric:        Behavior: Behavior is cooperative.      UC Treatments / Results  Labs (all labs ordered are listed, but only abnormal results are displayed) Labs Reviewed  POCT URINALYSIS DIP (MANUAL ENTRY) - Abnormal; Notable for the following components:      Result Value   Clarity, UA turbid (*)    Spec Grav, UA >=1.030 (*)    Blood, UA moderate (*)    Nitrite, UA Positive (*)    All other components within normal limits  URINE CULTURE  POCT URINE PREGNANCY    EKG   Radiology No results found.  Procedures Procedures (including critical care time)  Medications Ordered in UC Medications  ketorolac  (TORADOL ) 30 MG/ML injection 30 mg (has no administration in time range)    Initial Impression / Assessment and Plan / UC Course  I have reviewed the triage vital signs and the nursing notes.  Pertinent labs & imaging results that were available during my care of the patient were reviewed by me and considered in my medical decision making (see chart for details).     Patient is mildly  ill-appearing.  Left CVA tenderness noted.  No other significant findings on exam.  EOMI and PERRLA.  No neurodeficits noted.  GCS 15.  UPT negative.  Urinalysis reveals positive nitrites and moderate RBCs.  Will send culture to confirm presence of urinary tract related infection.  Empirically treated with Bactrim for possible urinary tract infection.  Given IM Toradol  in clinic for acute pain.  Discussed follow-up, return, and strict ER precautions. Final Clinical Impressions(s) / UC Diagnoses   Final diagnoses:  Left flank pain  Bad headache     Discharge Instructions      We have given you an injection of Toradol  in clinic today to help with your back pain and headache. Start taking Bactrim twice daily for 3 days for urinary tract infection coverage. Your urine culture will return over the next few days and someone will call if results suggest the additional treatment is needed. If you develop worsening back pain, worsening headache, excessive vomiting, blood in urine, fevers, or severe weakness please seek immediate medical treatment in the emergency department     ED Prescriptions     Medication Sig Dispense Auth. Provider   sulfamethoxazole-trimethoprim (BACTRIM DS) 800-160 MG tablet Take 1 tablet by mouth 2 (two) times daily for  3 days. 6 tablet Johnie Flaming A, NP      PDMP not reviewed this encounter.   Johnie Flaming A, NP 04/11/24 1123

## 2024-04-11 NOTE — ED Triage Notes (Signed)
 Pt states left  upper and mid back pain for the past week.  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  Also states she is having a headache on her left side with light sensitivity. States she took advil  this morning with no relief.

## 2024-04-11 NOTE — Discharge Instructions (Signed)
 We have given you an injection of Toradol  in clinic today to help with your back pain and headache. Start taking Bactrim twice daily for 3 days for urinary tract infection coverage. Your urine culture will return over the next few days and someone will call if results suggest the additional treatment is needed. If you develop worsening back pain, worsening headache, excessive vomiting, blood in urine, fevers, or severe weakness please seek immediate medical treatment in the emergency department

## 2024-04-13 ENCOUNTER — Ambulatory Visit (HOSPITAL_COMMUNITY): Payer: Self-pay

## 2024-04-13 LAB — URINE CULTURE: Culture: >=100000 — AB

## 2024-05-20 ENCOUNTER — Ambulatory Visit (HOSPITAL_COMMUNITY)
Admission: EM | Admit: 2024-05-20 | Discharge: 2024-05-20 | Disposition: A | Discharge status: Home / Self Care | Attending: Internal Medicine | Admitting: Internal Medicine

## 2024-05-20 ENCOUNTER — Other Ambulatory Visit: Payer: Self-pay

## 2024-05-20 ENCOUNTER — Encounter (HOSPITAL_COMMUNITY): Payer: Self-pay | Admitting: *Deleted

## 2024-05-20 DIAGNOSIS — H66002 Acute suppurative otitis media without spontaneous rupture of ear drum, left ear: Secondary | ICD-10-CM

## 2024-05-20 MED ORDER — KETOROLAC TROMETHAMINE 30 MG/ML IJ SOLN
INTRAMUSCULAR | Status: AC
  Filled 2024-05-20: qty 1

## 2024-05-20 MED ORDER — AMOXICILLIN-POT CLAVULANATE 875-125 MG PO TABS: 1.0000 | ORAL_TABLET | Freq: Two times a day (BID) | ORAL | 0 refills | Status: AC

## 2024-05-20 MED ORDER — KETOROLAC TROMETHAMINE 30 MG/ML IJ SOLN
30.0000 mg | Freq: Once | INTRAMUSCULAR | Status: AC
  Administered 2024-05-20: 30 mg via INTRAMUSCULAR

## 2024-05-20 NOTE — Discharge Instructions (Addendum)
 Symptoms and physical exam findings are consistent with a left otitis media.  This is an infection in the left ear that is treated with antibiotics by mouth.  We will also give an injection of Toradol  today due to the severe pain.  Augmentin 875 mg twice daily for 10 days.  This is an antibiotic.  Take this with food. Toradol  injection given today. This is a medication to help with pain. This is not a narcotic.   Make sure to stay hydrated by drinking plenty of water. Return to urgent care or PCP if symptoms worsen or fail to resolve.

## 2024-05-20 NOTE — ED Provider Notes (Signed)
 [facility]    CSN: [id number] Arrival date & time: 05/20/24  1152      History   Chief Complaint Chief Complaint  Patient presents with   Otalgia    HPI Miranda Bowen is a 38 y.o. female.   38 year old female presents urgent care with complaints of left ear pain for the last 4 days.  She reports that it started off very mild and has worsened significantly.  She is keeping a cottonball in the ear as irrigating the area hurts.  She denies any drainage from the ear.  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  She denies any shortness of breath, fevers, chills, congestion, cough or other upper respiratory symptoms.  She has been taking Tylenol  and ibuprofen  and it is not helping.   Otalgia Associated symptoms: no abdominal pain, no cough, no fever, no rash, no sore throat and no vomiting     Past Medical History:  Diagnosis Date   Heartburn during pregnancy     There are no active problems to display for this patient.   Past Surgical History:  Procedure Laterality Date   CESAREAN SECTION N/A 03/26/2014   Procedure: CESAREAN SECTION;  Surgeon: Harland JAYSON Birkenhead, MD;  Location: [facility];  Service: Obstetrics;  Laterality: N/A;   CESAREAN SECTION     CESAREAN SECTION N/A 09/24/2015   Procedure: CESAREAN SECTION;  Surgeon: Winton Felt, MD;  Location: [facility];  Service: Obstetrics;  Laterality: N/A;   TUBAL LIGATION      OB History     Gravida  3   Para  3   Term  3   Preterm  0   AB  0   Living  3      SAB  0   IAB  0   Ectopic  0   Multiple  0   Live Births  3            Home Medications    Prior to Admission medications   Medication Sig Start Date End Date Taking? Authorizing Provider  amoxicillin -clavulanate (AUGMENTIN) 875-125 MG tablet Take 1 tablet by mouth every 12 (twelve) hours for 10 days. 05/20/24 05/30/24 Yes Tomika Eckles A, PA-C  acetaminophen  (TYLENOL )  325 MG tablet Take 325-650 mg by mouth every 6 (six) hours as needed (for pain or headaches).    [provider]  cephALEXin  (KEFLEX ) 500 MG capsule Take 1 capsule (500 mg total) by mouth 4 (four) times daily. 10/22/23   Barrett, Jamie N, PA-C  cyclobenzaprine  (FLEXERIL ) 5 MG tablet Take 1 tablet (5 mg total) by mouth 3 (three) times daily as needed for muscle spasms. Patient not taking: Reported on 01/30/2017 06/11/16   Jamelle Lorrayne HERO, NP  erythromycin  ophthalmic ointment Place a 1/2 inch ribbon of ointment into the lower eyelid. 10/30/20   Layden, Lindsey A, PA-C  ibuprofen  (ADVIL ,MOTRIN ) 600 MG tablet Take 1 tablet (600 mg total) by mouth every 6 (six) hours as needed. 01/30/17   Charlyn Sora, MD  Multiple Vitamin (MULTIVITAMIN PO) Take by mouth.    [provider]  ondansetron  (ZOFRAN -ODT) 4 MG disintegrating tablet Take 1 tablet (4 mg total) by mouth every 8 (eight) hours as needed for nausea or vomiting. 10/22/23   Barrett, Warren SAILOR, PA-C  Prenatal Multivit-Min-Fe-FA (PRENATAL VITAMINS) 0.8 MG tablet Take 1 tablet by mouth daily. Patient not taking: Reported on 11/05/2015 02/07/15  Karim-Rhoades, Walidah N, CNM  promethazine  (PHENERGAN ) 25 MG tablet Take 1 tablet (25 mg total) by mouth every 6 (six) hours as needed for nausea. 01/30/17   Charlyn Sora, MD    Family History Family History  Problem Relation Age of Onset   Hypertension Mother     Social History Social History   Tobacco Use   Smoking status: Never   Smokeless tobacco: Never  Vaping Use   Vaping status: Never Used  Substance Use Topics   Alcohol use: Yes    Comment: occ   Drug use: No     Allergies   Patient has no known allergies.   Review of Systems Review of Systems  Constitutional:  Negative for chills and fever.  HENT:  Positive for ear pain. Negative for sore throat.   Eyes:  Negative for pain and visual disturbance.  Respiratory:  Negative for cough and shortness of breath.    Cardiovascular:  Negative for chest pain and palpitations.  Gastrointestinal:  Negative for abdominal pain and vomiting.  Genitourinary:  Negative for dysuria and hematuria.  Musculoskeletal:  Negative for arthralgias and back pain.  Skin:  Negative for color change and rash.  Neurological:  Negative for seizures and syncope.  All other systems reviewed and are negative.    Physical Exam Triage Vital Signs ED Triage Vitals  Encounter Vitals Group     BP 05/20/24 1336 125/83     Girls Systolic BP Percentile --      Girls Diastolic BP Percentile --      Boys Systolic BP Percentile --      Boys Diastolic BP Percentile --      Pulse Rate 05/20/24 1336 (!) 58     Resp 05/20/24 1336 18     Temp 05/20/24 1336 98.4 F (36.9 C)     Temp src --      SpO2 05/20/24 1336 94 %     Weight --      Height --      Head Circumference --      Peak Flow --      Pain Score 05/20/24 1334 10     Pain Loc --      Pain Education --      Exclude from Growth Chart --    No data found.  Updated Vital Signs BP 125/83   Pulse (!) 58   Temp 98.4 F (36.9 C)   Resp 18   LMP 04/30/2024 (Approximate)   SpO2 94%   Visual Acuity Right Eye Distance:   Left Eye Distance:   Bilateral Distance:    Right Eye Near:   Left Eye Near:    Bilateral Near:     Physical Exam Vitals and nursing note reviewed.  Constitutional:      General: She is not in acute distress.    Appearance: She is well-developed.  HENT:     Head: Normocephalic and atraumatic.     Right Ear: No middle ear effusion.     Left Ear: No decreased hearing noted. No drainage. A middle ear effusion is present. Tympanic membrane is erythematous and bulging. Tympanic membrane is not perforated.  Eyes:     Conjunctiva/sclera: Conjunctivae normal.  Cardiovascular:     Rate and Rhythm: Normal rate and regular rhythm.     Heart sounds: No murmur heard. Pulmonary:     Effort: Pulmonary effort is normal. No respiratory distress.      Breath sounds: Normal breath sounds.  Abdominal:  Palpations: Abdomen is soft.     Tenderness: There is no abdominal tenderness.  Musculoskeletal:        General: No swelling.     Cervical back: Neck supple.  Skin:    General: Skin is warm and dry.     Capillary Refill: Capillary refill takes less than 2 seconds.  Neurological:     Mental Status: She is alert.  Psychiatric:        Mood and Affect: Mood normal.      UC Treatments / Results  Labs (all labs ordered are listed, but only abnormal results are displayed) Labs Reviewed - No data to display  EKG   Radiology No results found.  Procedures Procedures (including critical care time)  Medications Ordered in UC Medications  ketorolac  (TORADOL ) 30 MG/ML injection 30 mg (has no administration in time range)    Initial Impression / Assessment and Plan / UC Course  I have reviewed the triage vital signs and the nursing notes.  Pertinent labs & imaging results that were available during my care of the patient were reviewed by me and considered in my medical decision making (see chart for details).     Non-recurrent acute suppurative otitis media of left ear without spontaneous rupture of tympanic membrane   Symptoms and physical exam findings are consistent with a left otitis media without perforation.  This is an infection in the left ear that is treated with antibiotics by mouth.  We will also give an injection of Toradol  today due to the severe pain.  Augmentin 875 mg twice daily for 10 days.  This is an antibiotic.  Take this with food. Toradol  injection given today. This is a medication to help with pain. This is not a narcotic.   Make sure to stay hydrated by drinking plenty of water. Return to urgent care or PCP if symptoms worsen or fail to resolve.    Final Clinical Impressions(s) / UC Diagnoses   Final diagnoses:  Non-recurrent acute suppurative otitis media of left ear without spontaneous rupture of  tympanic membrane     Discharge Instructions      Symptoms and physical exam findings are consistent with a left otitis media.  This is an infection in the left ear that is treated with antibiotics by mouth.  We will also give an injection of Toradol  today due to the severe pain.  Augmentin 875 mg twice daily for 10 days.  This is an antibiotic.  Take this with food. Toradol  injection given today. This is a medication to help with pain. This is not a narcotic.   Make sure to stay hydrated by drinking plenty of water. Return to urgent care or PCP if symptoms worsen or fail to resolve.      ED Prescriptions     Medication Sig Dispense Auth. Provider   amoxicillin -clavulanate (AUGMENTIN) 875-125 MG tablet Take 1 tablet by mouth every 12 (twelve) hours for 10 days. 20 tablet Teresa Almarie LABOR, [place]      PDMP not reviewed this encounter.   Teresa Almarie LABOR, [place] 05/20/24 1352

## 2024-05-20 NOTE — ED Triage Notes (Signed)
 PT reports Lt ear pain for 4 days. Pt has been taking OTC with out results.
